# Patient Record
Sex: Female | Born: 1950
Health system: Southern US, Community
[De-identification: ages and names within clinical notes are randomized; demographics above are authoritative.]

## PROBLEM LIST (undated history)

## (undated) DIAGNOSIS — I219 Acute myocardial infarction, unspecified: Secondary | ICD-10-CM

## (undated) DIAGNOSIS — I2510 Atherosclerotic heart disease of native coronary artery without angina pectoris: Secondary | ICD-10-CM

## (undated) DIAGNOSIS — I10 Essential (primary) hypertension: Secondary | ICD-10-CM

## (undated) DIAGNOSIS — Z789 Other specified health status: Secondary | ICD-10-CM

---
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
## 1997-10-22 ENCOUNTER — Ambulatory Visit (HOSPITAL_COMMUNITY): Admission: RE | Admit: 1997-10-22 | Discharge: 1997-10-22 | Payer: Self-pay | Admitting: Family Medicine

## 1999-09-05 ENCOUNTER — Encounter: Payer: Self-pay | Admitting: Family Medicine

## 1999-09-05 ENCOUNTER — Ambulatory Visit (HOSPITAL_COMMUNITY): Admission: RE | Admit: 1999-09-05 | Discharge: 1999-09-05 | Payer: Self-pay | Admitting: Family Medicine

## 1999-11-20 ENCOUNTER — Encounter: Payer: Self-pay | Admitting: Family Medicine

## 1999-11-20 ENCOUNTER — Encounter: Admission: RE | Admit: 1999-11-20 | Discharge: 1999-11-20 | Payer: Self-pay | Admitting: Family Medicine

## 2000-11-20 ENCOUNTER — Encounter: Payer: Self-pay | Admitting: Family Medicine

## 2000-11-20 ENCOUNTER — Encounter: Admission: RE | Admit: 2000-11-20 | Discharge: 2000-11-20 | Payer: Self-pay | Admitting: Family Medicine

## 2001-12-24 ENCOUNTER — Encounter: Payer: Self-pay | Admitting: Family Medicine

## 2001-12-24 ENCOUNTER — Encounter: Admission: RE | Admit: 2001-12-24 | Discharge: 2001-12-24 | Payer: Self-pay | Admitting: Family Medicine

## 2002-12-28 ENCOUNTER — Encounter: Admission: RE | Admit: 2002-12-28 | Discharge: 2002-12-28 | Payer: Self-pay | Admitting: Family Medicine

## 2002-12-28 ENCOUNTER — Encounter: Payer: Self-pay | Admitting: Family Medicine

## 2003-06-21 ENCOUNTER — Other Ambulatory Visit: Admission: RE | Admit: 2003-06-21 | Discharge: 2003-06-21 | Payer: Self-pay | Admitting: Family Medicine

## 2004-08-23 ENCOUNTER — Other Ambulatory Visit: Admission: RE | Admit: 2004-08-23 | Discharge: 2004-08-23 | Payer: Self-pay | Admitting: Family Medicine

## 2006-02-13 ENCOUNTER — Encounter: Admission: RE | Admit: 2006-02-13 | Discharge: 2006-02-13 | Payer: Self-pay | Admitting: Family Medicine

## 2006-05-29 ENCOUNTER — Other Ambulatory Visit: Admission: RE | Admit: 2006-05-29 | Discharge: 2006-05-29 | Payer: Self-pay | Admitting: Family Medicine

## 2006-06-03 ENCOUNTER — Encounter: Admission: RE | Admit: 2006-06-03 | Discharge: 2006-06-03 | Payer: Self-pay | Admitting: Family Medicine

## 2007-02-17 ENCOUNTER — Encounter: Admission: RE | Admit: 2007-02-17 | Discharge: 2007-02-17 | Payer: Self-pay | Admitting: Family Medicine

## 2007-10-22 ENCOUNTER — Encounter: Admission: RE | Admit: 2007-10-22 | Discharge: 2007-10-22 | Payer: Self-pay | Admitting: Endocrinology

## 2008-01-28 ENCOUNTER — Other Ambulatory Visit: Admission: RE | Admit: 2008-01-28 | Discharge: 2008-01-28 | Payer: Self-pay | Admitting: Family Medicine

## 2008-03-17 ENCOUNTER — Encounter: Admission: RE | Admit: 2008-03-17 | Discharge: 2008-03-17 | Payer: Self-pay | Admitting: Family Medicine

## 2008-03-23 ENCOUNTER — Encounter: Admission: RE | Admit: 2008-03-23 | Discharge: 2008-03-23 | Payer: Self-pay | Admitting: Family Medicine

## 2009-04-07 ENCOUNTER — Encounter: Admission: RE | Admit: 2009-04-07 | Discharge: 2009-04-07 | Payer: Self-pay | Admitting: Family Medicine

## 2009-04-18 ENCOUNTER — Other Ambulatory Visit: Admission: RE | Admit: 2009-04-18 | Discharge: 2009-04-18 | Payer: Self-pay | Admitting: Family Medicine

## 2010-05-07 ENCOUNTER — Encounter: Payer: Self-pay | Admitting: Family Medicine

## 2011-11-01 ENCOUNTER — Other Ambulatory Visit: Payer: Self-pay | Admitting: Family Medicine

## 2011-11-01 DIAGNOSIS — Z1231 Encounter for screening mammogram for malignant neoplasm of breast: Secondary | ICD-10-CM

## 2011-11-07 ENCOUNTER — Ambulatory Visit
Admission: RE | Admit: 2011-11-07 | Discharge: 2011-11-07 | Disposition: A | Discharge status: Home / Self Care | Source: Ambulatory Visit | Attending: Family Medicine | Admitting: Family Medicine

## 2011-11-07 DIAGNOSIS — Z1231 Encounter for screening mammogram for malignant neoplasm of breast: Secondary | ICD-10-CM

## 2012-02-28 ENCOUNTER — Encounter (HOSPITAL_COMMUNITY): Payer: Self-pay | Admitting: Emergency Medicine

## 2012-02-28 ENCOUNTER — Emergency Department (HOSPITAL_COMMUNITY)
Admission: EM | Admit: 2012-02-28 | Discharge: 2012-02-28 | Disposition: A | Discharge status: Home / Self Care | Payer: BC Managed Care – PPO | Attending: Emergency Medicine | Admitting: Emergency Medicine

## 2012-02-28 DIAGNOSIS — Z0000 Encounter for general adult medical examination without abnormal findings: Secondary | ICD-10-CM

## 2012-02-28 DIAGNOSIS — T7840XA Allergy, unspecified, initial encounter: Secondary | ICD-10-CM

## 2012-02-28 DIAGNOSIS — R220 Localized swelling, mass and lump, head: Secondary | ICD-10-CM | POA: Insufficient documentation

## 2012-02-28 DIAGNOSIS — Z7982 Long term (current) use of aspirin: Secondary | ICD-10-CM | POA: Insufficient documentation

## 2012-02-28 DIAGNOSIS — J029 Acute pharyngitis, unspecified: Secondary | ICD-10-CM | POA: Insufficient documentation

## 2012-02-28 DIAGNOSIS — Z79899 Other long term (current) drug therapy: Secondary | ICD-10-CM | POA: Insufficient documentation

## 2012-02-28 DIAGNOSIS — T4995XA Adverse effect of unspecified topical agent, initial encounter: Secondary | ICD-10-CM | POA: Insufficient documentation

## 2012-02-28 LAB — POCT I-STAT, CHEM 8
BUN: 23 mg/dL (ref 6–23)
Chloride: 106 mEq/L (ref 96–112)
Creatinine, Ser: 1.1 mg/dL (ref 0.50–1.10)
Hemoglobin: 13.3 g/dL (ref 12.0–15.0)

## 2012-02-28 LAB — CBC
MCHC: 33.6 g/dL (ref 30.0–36.0)
MCV: 92.4 fL (ref 78.0–100.0)
RBC: 4.19 MIL/uL (ref 3.87–5.11)
WBC: 4.9 10*3/uL (ref 4.0–10.5)

## 2012-02-28 NOTE — ED Notes (Signed)
Pt. Reports "I think I had a mini-stroke last week". Reports woke up with right eye and bottom lip swollen. Reports sore throat and tongue hurting. Alert and oriented x4.

## 2012-02-28 NOTE — ED Notes (Signed)
Pt. Alert and oriented x4. No signs of distress.

## 2012-02-28 NOTE — ED Notes (Addendum)
Pt reports that her tongue is hurting, neck and throat hurting, and R lip was swollen one day and then next day L lip was swollen- swelling has gone away; pt also reports that R eye is swollen; on assessment pt has no swelling to lips or eye

## 2012-02-28 NOTE — ED Provider Notes (Signed)
History     CSN: [id number]  Arrival date & time 02/28/12  1542   First MD Initiated Contact with Patient 02/28/12 1715      Chief Complaint  Patient presents with  . Multiple Complaints     (Consider location/radiation/quality/duration/timing/severity/associated sxs/prior treatment) The history is provided by the patient. No language interpreter was used.  cc:  61 yo female Patient c/o possible allergic reaction to ?  States that last Tuesday (9 days ago) she woke with swollen lips on the R and tender tongue that resolved that day.  On Thursday she had swollen lips on the L and she prayed and it got better.  Also her R eye was slightly swollen but has resolved.  States that her throat was scratchy 2 days ago and she ate some salty olives and her throat was better.  Today her tongue is tender and her R eye seems swollen as well.  Denies SOB, chest pain,  Vision problems, swallowing problems, memory problems, cough.  pmh of hyperlipidemia and GERD.  Goes to [facility] for pcp.  No surgeries in the past.    History reviewed. No pertinent past medical history.  History reviewed. No pertinent past surgical history.  History reviewed. No pertinent family history.  History  Substance Use Topics  . Smoking status: Never Smoker   . Smokeless tobacco: Not on file  . Alcohol Use: No    OB History    Grav Para Term Preterm Abortions TAB SAB Ect Mult Living                  Review of Systems  Constitutional: Negative.   HENT: Positive for sore throat and facial swelling. Negative for hearing loss, ear pain, congestion, rhinorrhea, sneezing, drooling, mouth sores, trouble swallowing, neck pain, neck stiffness, dental problem, sinus pressure and tinnitus.   Eyes: Negative.   Respiratory: Negative.   Cardiovascular: Negative.   Gastrointestinal: Negative.   Musculoskeletal: Negative for back pain.  Neurological: Negative.   Psychiatric/Behavioral: Negative.   All other systems  reviewed and are negative.    Allergies  Review of patient's allergies indicates no known allergies.  Home Medications   Current Outpatient Rx  Name  Route  Sig  Dispense  Refill  . ASPIRIN 81 MG PO CHEW   Oral   Chew 81 mg by mouth daily.         Marland Kitchen CALCIUM PO   Oral   Take 1 tablet by mouth daily.         Marland Kitchen VITAMIN D PO   Oral   Take 1 tablet by mouth daily.         Marland Kitchen ESOMEPRAZOLE MAGNESIUM 40 MG PO CPDR   Oral   Take 40 mg by mouth daily as needed. For heartburn         . IBANDRONATE SODIUM 150 MG PO TABS   Oral   Take 150 mg by mouth every 30 (thirty) days. Take in the morning with a full glass of water, on an empty stomach, and do not take anything else by mouth or lie down for the next 30 min. Takes the first week of the month.         . ADULT MULTIVITAMIN W/MINERALS CH   Oral   Take 1 tablet by mouth daily.         . OMEGA-3-ACID ETHYL ESTERS 1 G PO CAPS   Oral   Take 1 g by mouth daily.         Marland Kitchen  ROSUVASTATIN CALCIUM 10 MG PO TABS   Oral   Take 10 mg by mouth daily.           BP 149/79  Pulse 85  Temp 98.5 F (36.9 C) (Oral)  Resp 16  SpO2 100%  Physical Exam  Nursing note and vitals reviewed. Constitutional: She is oriented to person, place, and time. She appears well-developed and well-nourished.  HENT:  Head: Normocephalic and atraumatic.  Right Ear: Tympanic membrane normal.  Left Ear: Tympanic membrane normal.  Nose: Nose normal. No mucosal edema, rhinorrhea or sinus tenderness.  Mouth/Throat: Uvula is midline, oropharynx is clear and moist and mucous membranes are normal. She does not have dentures. Normal dentition. Uvula swelling present. No dental abscesses, lacerations or dental caries. No oropharyngeal exudate, posterior oropharyngeal edema, posterior oropharyngeal erythema or tonsillar abscesses.  Eyes: Conjunctivae normal and EOM are normal. Pupils are equal, round, and reactive to light.  Neck: Normal range of motion.  Neck supple.  Cardiovascular: Normal rate.   Pulmonary/Chest: Effort normal and breath sounds normal. No respiratory distress. She has no wheezes.  Abdominal: Soft.  Musculoskeletal: Normal range of motion. She exhibits no edema and no tenderness.  Neurological: She is alert and oriented to person, place, and time. She has normal reflexes.  Skin: Skin is warm and dry.  Psychiatric: She has a normal mood and affect.    ED Course  Procedures (including critical care time)   Labs Reviewed  CBC   No results found.   No diagnosis found.    MDM  Symptoms in the past week with swollen lips and tender tongue resolved tonight. ? Allergic reaction.  Labs unremarkable.  Understands to return for worsening symptoms.  Have benadryl on hand.  She is not on an ACE inhibitor. She will follow up with pcp tomorrow.   Labs Reviewed  POCT I-STAT, CHEM 8 - Abnormal; Notable for the following:    Glucose, Bld 100 (*)     All other components within normal limits  CBC          Remi Haggard, NP 02/29/12 0040

## 2012-03-03 NOTE — ED Provider Notes (Signed)
Medical screening examination/treatment/procedure(s) were performed by non-physician practitioner and as supervising physician I was immediately available for consultation/collaboration.   Celene Kras, MD 03/03/12 [phone]

## 2012-03-17 ENCOUNTER — Other Ambulatory Visit (HOSPITAL_COMMUNITY)
Admission: RE | Admit: 2012-03-17 | Discharge: 2012-03-17 | Disposition: A | Discharge status: Home / Self Care | Payer: BC Managed Care – PPO | Source: Ambulatory Visit | Attending: Family Medicine | Admitting: Family Medicine

## 2012-03-17 ENCOUNTER — Other Ambulatory Visit: Payer: Self-pay | Admitting: Family Medicine

## 2012-03-17 DIAGNOSIS — Z01419 Encounter for gynecological examination (general) (routine) without abnormal findings: Secondary | ICD-10-CM | POA: Insufficient documentation

## 2012-03-27 ENCOUNTER — Other Ambulatory Visit: Payer: Self-pay | Admitting: Family Medicine

## 2012-03-27 DIAGNOSIS — M810 Age-related osteoporosis without current pathological fracture: Secondary | ICD-10-CM

## 2012-04-04 ENCOUNTER — Other Ambulatory Visit

## 2012-04-10 ENCOUNTER — Ambulatory Visit
Admission: RE | Admit: 2012-04-10 | Discharge: 2012-04-10 | Disposition: A | Discharge status: Home / Self Care | Source: Ambulatory Visit | Attending: Family Medicine | Admitting: Family Medicine

## 2012-04-10 DIAGNOSIS — M810 Age-related osteoporosis without current pathological fracture: Secondary | ICD-10-CM

## 2013-03-23 ENCOUNTER — Other Ambulatory Visit: Payer: Self-pay | Admitting: Family Medicine

## 2013-03-23 DIAGNOSIS — R519 Headache, unspecified: Secondary | ICD-10-CM

## 2013-03-25 ENCOUNTER — Ambulatory Visit
Admission: RE | Admit: 2013-03-25 | Discharge: 2013-03-25 | Disposition: A | Discharge status: Home / Self Care | Payer: BC Managed Care – PPO | Source: Ambulatory Visit | Attending: Family Medicine | Admitting: Family Medicine

## 2013-03-25 DIAGNOSIS — R519 Headache, unspecified: Secondary | ICD-10-CM

## 2014-07-07 ENCOUNTER — Other Ambulatory Visit: Payer: Self-pay | Admitting: Family Medicine

## 2014-07-07 ENCOUNTER — Other Ambulatory Visit (HOSPITAL_COMMUNITY)
Admission: RE | Admit: 2014-07-07 | Discharge: 2014-07-07 | Disposition: A | Discharge status: Home / Self Care | Payer: BC Managed Care – PPO | Source: Ambulatory Visit | Attending: Family Medicine | Admitting: Family Medicine

## 2014-07-07 DIAGNOSIS — Z01419 Encounter for gynecological examination (general) (routine) without abnormal findings: Secondary | ICD-10-CM | POA: Diagnosis present

## 2014-07-07 DIAGNOSIS — Z1151 Encounter for screening for human papillomavirus (HPV): Secondary | ICD-10-CM | POA: Diagnosis present

## 2014-07-08 LAB — CYTOLOGY - PAP

## 2014-08-09 ENCOUNTER — Other Ambulatory Visit: Payer: Self-pay | Admitting: Family Medicine

## 2014-08-09 DIAGNOSIS — M810 Age-related osteoporosis without current pathological fracture: Secondary | ICD-10-CM

## 2014-08-16 ENCOUNTER — Ambulatory Visit
Admission: RE | Admit: 2014-08-16 | Discharge: 2014-08-16 | Disposition: A | Discharge status: Home / Self Care | Payer: BC Managed Care – PPO | Source: Ambulatory Visit | Attending: Family Medicine | Admitting: Family Medicine

## 2014-08-16 DIAGNOSIS — M810 Age-related osteoporosis without current pathological fracture: Secondary | ICD-10-CM

## 2014-11-30 ENCOUNTER — Encounter (HOSPITAL_COMMUNITY): Payer: Self-pay | Admitting: Emergency Medicine

## 2014-11-30 ENCOUNTER — Emergency Department (HOSPITAL_COMMUNITY)
Admission: EM | Admit: 2014-11-30 | Discharge: 2014-11-30 | Disposition: A | Discharge status: Home / Self Care | Payer: BC Managed Care – PPO | Attending: Emergency Medicine | Admitting: Emergency Medicine

## 2014-11-30 DIAGNOSIS — Z7982 Long term (current) use of aspirin: Secondary | ICD-10-CM | POA: Diagnosis not present

## 2014-11-30 DIAGNOSIS — T7840XA Allergy, unspecified, initial encounter: Secondary | ICD-10-CM

## 2014-11-30 DIAGNOSIS — Y9389 Activity, other specified: Secondary | ICD-10-CM | POA: Diagnosis not present

## 2014-11-30 DIAGNOSIS — X58XXXA Exposure to other specified factors, initial encounter: Secondary | ICD-10-CM | POA: Diagnosis not present

## 2014-11-30 DIAGNOSIS — K1379 Other lesions of oral mucosa: Secondary | ICD-10-CM

## 2014-11-30 DIAGNOSIS — Y9289 Other specified places as the place of occurrence of the external cause: Secondary | ICD-10-CM | POA: Insufficient documentation

## 2014-11-30 DIAGNOSIS — J384 Edema of larynx: Secondary | ICD-10-CM | POA: Diagnosis not present

## 2014-11-30 DIAGNOSIS — Z79899 Other long term (current) drug therapy: Secondary | ICD-10-CM | POA: Diagnosis not present

## 2014-11-30 DIAGNOSIS — Y998 Other external cause status: Secondary | ICD-10-CM | POA: Insufficient documentation

## 2014-11-30 DIAGNOSIS — R490 Dysphonia: Secondary | ICD-10-CM | POA: Diagnosis present

## 2014-11-30 MED ORDER — EPINEPHRINE 0.3 MG/0.3ML IJ SOAJ: 0.3000 mg | Freq: Once | INTRAMUSCULAR | Status: DC

## 2014-11-30 MED ORDER — DEXAMETHASONE 4 MG PO TABS
10.0000 mg | ORAL_TABLET | Freq: Once | ORAL | Status: AC
  Administered 2014-11-30: 10 mg via ORAL
  Filled 2014-11-30: qty 2
  Filled 2014-11-30: qty 1

## 2014-11-30 MED ORDER — FAMOTIDINE 40 MG PO TABS: 40.0000 mg | ORAL_TABLET | Freq: Two times a day (BID) | ORAL | Status: DC

## 2014-11-30 MED ORDER — DIPHENHYDRAMINE HCL 50 MG PO CAPS: 50.0000 mg | ORAL_CAPSULE | Freq: Four times a day (QID) | ORAL | Status: DC

## 2014-11-30 MED ORDER — FAMOTIDINE 20 MG PO TABS
40.0000 mg | ORAL_TABLET | Freq: Once | ORAL | Status: AC
  Administered 2014-11-30: 40 mg via ORAL
  Filled 2014-11-30: qty 2

## 2014-11-30 MED ORDER — DIPHENHYDRAMINE HCL 25 MG PO CAPS
50.0000 mg | ORAL_CAPSULE | Freq: Once | ORAL | Status: AC
  Administered 2014-11-30: 50 mg via ORAL
  Filled 2014-11-30: qty 2

## 2014-11-30 NOTE — ED Notes (Signed)
Pt is asking to leave. She reports feeling "slightly better", but states "i just have to get home"

## 2014-11-30 NOTE — Discharge Instructions (Signed)

## 2014-11-30 NOTE — ED Provider Notes (Signed)
CSN: [id number]     Arrival date & time 11/30/14  1932 History   First MD Initiated Contact with Patient 11/30/14 2011     Chief Complaint  Patient presents with  . Hoarse     (Consider location/radiation/quality/duration/timing/severity/associated sxs/prior Treatment) Patient is a 64 y.o. female presenting with allergic reaction. The history is provided by the patient.  Allergic Reaction Presenting symptoms: difficulty swallowing and swelling (of throat and lips)   Difficulty swallowing:    Severity:  Mild   Onset quality:  Gradual   Timing:  Intermittent Context: food (occuring after grapes while in training at her job)   Relieved by:  Nothing Worsened by:  Nothing tried Ineffective treatments:  None tried   History reviewed. No pertinent past medical history. History reviewed. No pertinent past surgical history. History reviewed. No pertinent family history. Social History  Substance Use Topics  . Smoking status: Never Smoker   . Smokeless tobacco: None  . Alcohol Use: No   OB History    No data available     Review of Systems  HENT: Positive for trouble swallowing.   All other systems reviewed and are negative.     Allergies  Review of patient's allergies indicates no known allergies.  Home Medications   Prior to Admission medications   Medication Sig Start Date End Date Taking? Authorizing Provider  aspirin 81 MG chewable tablet Chew 81 mg by mouth daily.   Yes Historical Provider, MD  CALCIUM PO Take 1 tablet by mouth daily.   Yes Historical Provider, MD  Cholecalciferol (VITAMIN D PO) Take 1 tablet by mouth daily.   Yes Historical Provider, MD  esomeprazole (NEXIUM) 40 MG capsule Take 40 mg by mouth daily as needed. For heartburn   Yes Historical Provider, MD  ibandronate (BONIVA) 150 MG tablet Take 150 mg by mouth every 30 (thirty) days. Take in the morning with a full glass of water, on an empty stomach, and do not take anything else by mouth or lie down  for the next 30 min. Takes the first week of the month.   Yes Historical Provider, MD  Multiple Vitamin (MULTIVITAMIN WITH MINERALS) TABS Take 1 tablet by mouth daily.   Yes Historical Provider, MD  omega-3 acid ethyl esters (LOVAZA) 1 G capsule Take 1 g by mouth daily.   Yes Historical Provider, MD  simvastatin (ZOCOR) 20 MG tablet Take 20 mg by mouth daily at 6 PM.   Yes Historical Provider, MD  diphenhydrAMINE (BENADRYL) 50 MG capsule Take 1 capsule (50 mg total) by mouth every 6 (six) hours. 11/30/14   Lyndal Pulley, MD  EPINEPHrine 0.3 mg/0.3 mL IJ SOAJ injection Inject 0.3 mLs (0.3 mg total) into the muscle once. 11/30/14   Lyndal Pulley, MD  famotidine (PEPCID) 40 MG tablet Take 1 tablet (40 mg total) by mouth 2 (two) times daily. 11/30/14   Lyndal Pulley, MD   BP 138/61 mmHg  Pulse 68  Temp(Src) 97.5 F (36.4 C) (Oral)  Resp 18  SpO2 100% Physical Exam  Constitutional: She is oriented to person, place, and time. She appears well-developed and well-nourished. No distress.  HENT:  Head: Normocephalic.  Mouth/Throat: Mucous membranes are normal. Uvula swelling (mild asymmetrical without PTA or shift) present. No oropharyngeal exudate or tonsillar abscesses.  Eyes: Conjunctivae are normal.  Neck: Trachea normal and full passive range of motion without pain. Neck supple. No tracheal deviation present.  Cardiovascular: Normal rate and regular rhythm.   Pulmonary/Chest: Effort normal. No  stridor. No respiratory distress.  Abdominal: Soft. She exhibits no distension.  Neurological: She is alert and oriented to person, place, and time.  Skin: Skin is warm and dry.  Psychiatric: She has a normal mood and affect.    ED Course  Procedures (including critical care time) Labs Review Labs Reviewed - No data to display  Imaging Review No results found. I have personally reviewed and evaluated these images and lab results as part of my medical decision-making.   EKG Interpretation None       MDM   Final diagnoses:  Allergic reaction, initial encounter  Hoarse voice quality  Uvular edema   64 y.o. female presents with hoarseness of voice since today at her job training when she was up to speak to the group she was unable to. She states she went to a walk-in clinic and was sent here with concern for mass and paper from clinic shows there was concern for impending airway. Airway is clear and Pt with mild uvular swelling but no evidence of mass effect or shift. She states she has had these episodes intermittently and has not been definitively diagnosed. Throughout interview she is mildly uncooperative and insistent that she only eats organic foods and exercises frequently. She admits to having some swelling following eating grapes and also notes her symptoms started todday after eating grapes. Provided H1/H2 blockers and decadron for swelling, improved during ED course and hoarseness improved. No indication for imaging currently. No other evidence of impending anaphylaxis but provided with prescription for epi pen to carry with her in case of impending worsening airway. Recommended scheduling antihistamines for the next few days and monitoring her symptoms closely. Also recommended allergy testing and outpatient workup for autoimmune/rheumatologic causes if unenlightening. Plan to follow up with PCP as needed and return precautions discussed for worsening or new concerning symptoms.    Lyndal Pulley, MD 12/01/14 [phone]

## 2014-11-30 NOTE — ED Notes (Signed)
Pt is coming from walk-in clinic [facility] physicians) reporting left pharyngeal mass with uvula shift to the right. Denies SOB/difficulty swallowing. Patient's voice is hoarse and says "some drool came out today without me realizing it." Speaking full/clear sentences. No drooling noted. Also says she notices some lip and eye swelling at intermittent times. No swelling noted at this time.

## 2015-08-09 ENCOUNTER — Other Ambulatory Visit (HOSPITAL_COMMUNITY)
Admission: RE | Admit: 2015-08-09 | Discharge: 2015-08-09 | Disposition: A | Discharge status: Home / Self Care | Payer: BC Managed Care – PPO | Source: Ambulatory Visit | Attending: Family Medicine | Admitting: Family Medicine

## 2015-08-09 ENCOUNTER — Other Ambulatory Visit: Payer: Self-pay | Admitting: Family Medicine

## 2015-08-09 DIAGNOSIS — Z01419 Encounter for gynecological examination (general) (routine) without abnormal findings: Secondary | ICD-10-CM | POA: Diagnosis present

## 2015-08-11 LAB — CYTOLOGY - PAP

## 2015-10-05 ENCOUNTER — Other Ambulatory Visit: Payer: Self-pay | Admitting: Family Medicine

## 2015-10-05 DIAGNOSIS — Z1231 Encounter for screening mammogram for malignant neoplasm of breast: Secondary | ICD-10-CM

## 2015-10-24 ENCOUNTER — Ambulatory Visit
Admission: RE | Admit: 2015-10-24 | Discharge: 2015-10-24 | Disposition: A | Discharge status: Home / Self Care | Payer: BC Managed Care – PPO | Source: Ambulatory Visit | Attending: Family Medicine | Admitting: Family Medicine

## 2015-10-24 DIAGNOSIS — Z1231 Encounter for screening mammogram for malignant neoplasm of breast: Secondary | ICD-10-CM

## 2016-08-13 ENCOUNTER — Other Ambulatory Visit (HOSPITAL_COMMUNITY)
Admission: RE | Admit: 2016-08-13 | Discharge: 2016-08-13 | Disposition: A | Discharge status: Home / Self Care | Payer: BC Managed Care – PPO | Source: Ambulatory Visit | Attending: Family Medicine | Admitting: Family Medicine

## 2016-08-13 ENCOUNTER — Other Ambulatory Visit: Payer: Self-pay | Admitting: Family Medicine

## 2016-08-13 DIAGNOSIS — Z01419 Encounter for gynecological examination (general) (routine) without abnormal findings: Secondary | ICD-10-CM | POA: Insufficient documentation

## 2016-08-14 LAB — CYTOLOGY - PAP: DIAGNOSIS: NEGATIVE

## 2016-09-26 ENCOUNTER — Other Ambulatory Visit: Payer: Self-pay | Admitting: Family Medicine

## 2016-09-26 DIAGNOSIS — Z1231 Encounter for screening mammogram for malignant neoplasm of breast: Secondary | ICD-10-CM

## 2016-10-24 ENCOUNTER — Ambulatory Visit
Admission: RE | Admit: 2016-10-24 | Discharge: 2016-10-24 | Disposition: A | Discharge status: Home / Self Care | Payer: BC Managed Care – PPO | Source: Ambulatory Visit | Attending: Family Medicine | Admitting: Family Medicine

## 2016-10-24 ENCOUNTER — Encounter: Payer: Self-pay | Admitting: Radiology

## 2016-10-24 DIAGNOSIS — Z1231 Encounter for screening mammogram for malignant neoplasm of breast: Secondary | ICD-10-CM

## 2016-11-08 ENCOUNTER — Ambulatory Visit: Payer: BC Managed Care – PPO

## 2017-04-15 DIAGNOSIS — H2513 Age-related nuclear cataract, bilateral: Secondary | ICD-10-CM | POA: Diagnosis not present

## 2017-04-15 DIAGNOSIS — H25013 Cortical age-related cataract, bilateral: Secondary | ICD-10-CM | POA: Diagnosis not present

## 2017-04-15 DIAGNOSIS — H40013 Open angle with borderline findings, low risk, bilateral: Secondary | ICD-10-CM | POA: Diagnosis not present

## 2017-04-15 DIAGNOSIS — H04123 Dry eye syndrome of bilateral lacrimal glands: Secondary | ICD-10-CM | POA: Diagnosis not present

## 2017-09-23 ENCOUNTER — Other Ambulatory Visit: Payer: Self-pay | Admitting: Family Medicine

## 2017-09-23 DIAGNOSIS — Z1231 Encounter for screening mammogram for malignant neoplasm of breast: Secondary | ICD-10-CM

## 2017-10-25 ENCOUNTER — Ambulatory Visit
Admission: RE | Admit: 2017-10-25 | Discharge: 2017-10-25 | Disposition: A | Discharge status: Home / Self Care | Payer: Medicare Other | Source: Ambulatory Visit | Attending: Family Medicine | Admitting: Family Medicine

## 2017-10-25 DIAGNOSIS — Z1231 Encounter for screening mammogram for malignant neoplasm of breast: Secondary | ICD-10-CM

## 2017-12-02 ENCOUNTER — Other Ambulatory Visit: Payer: Self-pay | Admitting: Family Medicine

## 2017-12-02 ENCOUNTER — Other Ambulatory Visit (HOSPITAL_COMMUNITY)
Admission: RE | Admit: 2017-12-02 | Discharge: 2017-12-02 | Disposition: A | Discharge status: Home / Self Care | Payer: Medicare Other | Source: Ambulatory Visit | Attending: Family Medicine | Admitting: Family Medicine

## 2017-12-02 DIAGNOSIS — Z23 Encounter for immunization: Secondary | ICD-10-CM | POA: Diagnosis not present

## 2017-12-02 DIAGNOSIS — Z1389 Encounter for screening for other disorder: Secondary | ICD-10-CM | POA: Diagnosis not present

## 2017-12-02 DIAGNOSIS — E7800 Pure hypercholesterolemia, unspecified: Secondary | ICD-10-CM | POA: Diagnosis not present

## 2017-12-02 DIAGNOSIS — Z0000 Encounter for general adult medical examination without abnormal findings: Secondary | ICD-10-CM | POA: Insufficient documentation

## 2017-12-02 DIAGNOSIS — K219 Gastro-esophageal reflux disease without esophagitis: Secondary | ICD-10-CM | POA: Diagnosis not present

## 2017-12-02 DIAGNOSIS — M810 Age-related osteoporosis without current pathological fracture: Secondary | ICD-10-CM | POA: Diagnosis not present

## 2017-12-03 ENCOUNTER — Other Ambulatory Visit: Payer: Self-pay | Admitting: Family Medicine

## 2017-12-03 DIAGNOSIS — M810 Age-related osteoporosis without current pathological fracture: Secondary | ICD-10-CM

## 2017-12-04 LAB — CYTOLOGY - PAP
Diagnosis: NEGATIVE
HPV: NOT DETECTED

## 2017-12-08 ENCOUNTER — Inpatient Hospital Stay (HOSPITAL_COMMUNITY)
Admission: EM | Admit: 2017-12-08 | Discharge: 2017-12-10 | DRG: 250 | Disposition: A | Discharge status: Home / Self Care | Payer: BC Managed Care – PPO | Attending: Cardiology | Admitting: Cardiology

## 2017-12-08 ENCOUNTER — Encounter (HOSPITAL_COMMUNITY)
Admission: EM | Disposition: A | Discharge status: Home / Self Care | Payer: Self-pay | Source: Home / Self Care | Attending: Cardiology

## 2017-12-08 ENCOUNTER — Encounter (HOSPITAL_COMMUNITY): Payer: Self-pay | Admitting: Emergency Medicine

## 2017-12-08 ENCOUNTER — Other Ambulatory Visit: Payer: Self-pay

## 2017-12-08 ENCOUNTER — Emergency Department (HOSPITAL_COMMUNITY): Payer: BC Managed Care – PPO

## 2017-12-08 DIAGNOSIS — I272 Pulmonary hypertension, unspecified: Secondary | ICD-10-CM | POA: Diagnosis present

## 2017-12-08 DIAGNOSIS — I2102 ST elevation (STEMI) myocardial infarction involving left anterior descending coronary artery: Secondary | ICD-10-CM

## 2017-12-08 DIAGNOSIS — Z9861 Coronary angioplasty status: Secondary | ICD-10-CM

## 2017-12-08 DIAGNOSIS — Z888 Allergy status to other drugs, medicaments and biological substances status: Secondary | ICD-10-CM

## 2017-12-08 DIAGNOSIS — E785 Hyperlipidemia, unspecified: Secondary | ICD-10-CM | POA: Diagnosis present

## 2017-12-08 DIAGNOSIS — I5041 Acute combined systolic (congestive) and diastolic (congestive) heart failure: Secondary | ICD-10-CM | POA: Diagnosis present

## 2017-12-08 DIAGNOSIS — I213 ST elevation (STEMI) myocardial infarction of unspecified site: Secondary | ICD-10-CM

## 2017-12-08 DIAGNOSIS — I4891 Unspecified atrial fibrillation: Secondary | ICD-10-CM | POA: Diagnosis not present

## 2017-12-08 HISTORY — DX: Atherosclerotic heart disease of native coronary artery without angina pectoris: I25.10

## 2017-12-08 HISTORY — PX: LEFT HEART CATH AND CORONARY ANGIOGRAPHY: CATH118249

## 2017-12-08 HISTORY — PX: CORONARY/GRAFT ACUTE MI REVASCULARIZATION: CATH118305

## 2017-12-08 HISTORY — DX: Other specified health status: Z78.9

## 2017-12-08 HISTORY — DX: Essential (primary) hypertension: I10

## 2017-12-08 HISTORY — DX: Acute myocardial infarction, unspecified: I21.9

## 2017-12-08 HISTORY — DX: ST elevation (STEMI) myocardial infarction involving left anterior descending coronary artery: I21.02

## 2017-12-08 LAB — CBC WITH DIFFERENTIAL/PLATELET
Basophils Absolute: 0 10*3/uL (ref 0.0–0.1)
Basophils Relative: 0 %
Eosinophils Absolute: 0.1 10*3/uL (ref 0.0–0.7)
Eosinophils Relative: 1 %
HCT: 37.8 % (ref 36.0–46.0)
Hemoglobin: 12.3 g/dL (ref 12.0–15.0)
Lymphocytes Relative: 66 %
Lymphs Abs: 3.5 10*3/uL (ref 0.7–4.0)
MCH: 30.5 pg (ref 26.0–34.0)
MCHC: 32.5 g/dL (ref 30.0–36.0)
MCV: 93.8 fL (ref 78.0–100.0)
Monocytes Absolute: 0.5 10*3/uL (ref 0.1–1.0)
Monocytes Relative: 10 %
Neutro Abs: 1.2 10*3/uL — ABNORMAL LOW (ref 1.7–7.7)
Neutrophils Relative %: 23 %
Platelets: 206 10*3/uL (ref 150–400)
RBC: 4.03 MIL/uL (ref 3.87–5.11)
RDW: 14 % (ref 11.5–15.5)
WBC: 5.3 10*3/uL (ref 4.0–10.5)

## 2017-12-08 LAB — COMPREHENSIVE METABOLIC PANEL
ALT: 23 U/L (ref 0–44)
AST: 32 U/L (ref 15–41)
Albumin: 3.7 g/dL (ref 3.5–5.0)
Alkaline Phosphatase: 85 U/L (ref 38–126)
Anion gap: 11 (ref 5–15)
BUN: 25 mg/dL — ABNORMAL HIGH (ref 8–23)
CO2: 25 mmol/L (ref 22–32)
Calcium: 9 mg/dL (ref 8.9–10.3)
Chloride: 105 mmol/L (ref 98–111)
Creatinine, Ser: 1.13 mg/dL — ABNORMAL HIGH (ref 0.44–1.00)
GFR calc Af Amer: 58 mL/min — ABNORMAL LOW (ref >60)
GFR calc non Af Amer: 50 mL/min — ABNORMAL LOW (ref >60)
Glucose, Bld: 127 mg/dL — ABNORMAL HIGH (ref 70–99)
Potassium: 3.6 mmol/L (ref 3.5–5.1)
Sodium: 141 mmol/L (ref 135–145)
Total Bilirubin: 0.6 mg/dL (ref 0.3–1.2)
Total Protein: 6.9 g/dL (ref 6.5–8.1)

## 2017-12-08 LAB — I-STAT TROPONIN, ED: Troponin i, poc: 0.01 ng/mL (ref 0.00–0.08)

## 2017-12-08 LAB — TROPONIN I: Troponin I: <0.03 ng/mL (ref <0.03)

## 2017-12-08 LAB — LIPID PANEL
Cholesterol: 175 mg/dL (ref 0–200)
HDL: 59 mg/dL (ref >40)
LDL Cholesterol: 103 mg/dL — ABNORMAL HIGH (ref 0–99)
Total CHOL/HDL Ratio: 3 RATIO
Triglycerides: 64 mg/dL (ref <150)
VLDL: 13 mg/dL (ref 0–40)

## 2017-12-08 LAB — APTT: aPTT: 25 seconds (ref 24–36)

## 2017-12-08 LAB — PROTIME-INR
INR: 0.93
Prothrombin Time: 12.4 seconds (ref 11.4–15.2)

## 2017-12-08 SURGERY — CORONARY/GRAFT ACUTE MI REVASCULARIZATION
Anesthesia: LOCAL

## 2017-12-08 MED ORDER — HEPARIN SODIUM (PORCINE) 1000 UNIT/ML IJ SOLN
INTRAMUSCULAR | Status: AC
  Filled 2017-12-08: qty 1

## 2017-12-08 MED ORDER — ASPIRIN EC 81 MG PO TBEC
81.0000 mg | DELAYED_RELEASE_TABLET | Freq: Every day | ORAL | Status: DC
  Administered 2017-12-09 – 2017-12-10 (×2): 81 mg via ORAL
  Filled 2017-12-08 (×2): qty 1

## 2017-12-08 MED ORDER — ASPIRIN 300 MG RE SUPP: 300.0000 mg | RECTAL | Status: AC

## 2017-12-08 MED ORDER — ASPIRIN 81 MG PO CHEW
324.0000 mg | CHEWABLE_TABLET | ORAL | Status: AC
  Filled 2017-12-08: qty 4

## 2017-12-08 MED ORDER — LIDOCAINE HCL (PF) 1 % IJ SOLN
INTRAMUSCULAR | Status: AC
  Filled 2017-12-08: qty 30

## 2017-12-08 MED ORDER — TICAGRELOR 90 MG PO TABS
ORAL_TABLET | ORAL | Status: AC
  Filled 2017-12-08: qty 2

## 2017-12-08 MED ORDER — MIDAZOLAM HCL 2 MG/2ML IJ SOLN
INTRAMUSCULAR | Status: DC | PRN
  Administered 2017-12-08: 1 mg via INTRAVENOUS

## 2017-12-08 MED ORDER — ALPRAZOLAM 0.25 MG PO TABS: 0.2500 mg | ORAL_TABLET | Freq: Two times a day (BID) | ORAL | Status: DC | PRN

## 2017-12-08 MED ORDER — MIDAZOLAM HCL 2 MG/2ML IJ SOLN
INTRAMUSCULAR | Status: AC
  Filled 2017-12-08: qty 2

## 2017-12-08 MED ORDER — ONDANSETRON HCL 4 MG/2ML IJ SOLN: 4.0000 mg | Freq: Four times a day (QID) | INTRAMUSCULAR | Status: DC | PRN

## 2017-12-08 MED ORDER — HEPARIN SODIUM (PORCINE) 1000 UNIT/ML IJ SOLN
INTRAMUSCULAR | Status: DC | PRN
  Administered 2017-12-08: 4000 [IU] via INTRAVENOUS

## 2017-12-08 MED ORDER — SODIUM CHLORIDE 0.9 % IV SOLN
INTRAVENOUS | Status: DC
  Administered 2017-12-08 – 2017-12-09 (×2): via INTRAVENOUS

## 2017-12-08 MED ORDER — NITROGLYCERIN IN D5W 200-5 MCG/ML-% IV SOLN
10.0000 ug/min | Freq: Once | INTRAVENOUS | Status: AC
  Administered 2017-12-08: 10 ug/min via INTRAVENOUS
  Filled 2017-12-08: qty 250

## 2017-12-08 MED ORDER — SIMVASTATIN 40 MG PO TABS: 40.0000 mg | ORAL_TABLET | Freq: Every day | ORAL | Status: DC

## 2017-12-08 MED ORDER — METOPROLOL TARTRATE 12.5 MG HALF TABLET
12.5000 mg | ORAL_TABLET | Freq: Two times a day (BID) | ORAL | Status: DC
  Administered 2017-12-09 – 2017-12-10 (×4): 12.5 mg via ORAL
  Filled 2017-12-08 (×4): qty 1

## 2017-12-08 MED ORDER — ASPIRIN 81 MG PO CHEW
324.0000 mg | CHEWABLE_TABLET | Freq: Once | ORAL | Status: AC
  Administered 2017-12-08: 324 mg via ORAL
  Filled 2017-12-08: qty 4

## 2017-12-08 MED ORDER — NITROGLYCERIN 1 MG/10 ML FOR IR/CATH LAB
INTRA_ARTERIAL | Status: DC | PRN
  Administered 2017-12-08 (×2): 200 ug via INTRACORONARY

## 2017-12-08 MED ORDER — HEPARIN SODIUM (PORCINE) 5000 UNIT/ML IJ SOLN
4000.0000 [IU] | Freq: Once | INTRAMUSCULAR | Status: AC
  Administered 2017-12-08: 4000 [IU] via INTRAVENOUS
  Filled 2017-12-08: qty 0.8

## 2017-12-08 MED ORDER — NITROGLYCERIN 1 MG/10 ML FOR IR/CATH LAB
INTRA_ARTERIAL | Status: AC
  Filled 2017-12-08: qty 10

## 2017-12-08 MED ORDER — TICAGRELOR 90 MG PO TABS
ORAL_TABLET | ORAL | Status: DC | PRN
  Administered 2017-12-08: 180 mg via ORAL

## 2017-12-08 MED ORDER — NITROGLYCERIN 0.4 MG SL SUBL: 0.4000 mg | SUBLINGUAL_TABLET | SUBLINGUAL | Status: DC | PRN

## 2017-12-08 MED ORDER — VERAPAMIL HCL 2.5 MG/ML IV SOLN
INTRAVENOUS | Status: AC
  Filled 2017-12-08: qty 2

## 2017-12-08 MED ORDER — VERAPAMIL HCL 2.5 MG/ML IV SOLN
INTRAVENOUS | Status: DC | PRN
  Administered 2017-12-08: 10 mL via INTRA_ARTERIAL

## 2017-12-08 MED ORDER — HEPARIN (PORCINE) IN NACL 100-0.45 UNIT/ML-% IJ SOLN
750.0000 [IU]/h | INTRAMUSCULAR | Status: DC
  Filled 2017-12-08: qty 250

## 2017-12-08 MED ORDER — METOPROLOL TARTRATE 5 MG/5ML IV SOLN
5.0000 mg | Freq: Once | INTRAVENOUS | Status: AC
  Administered 2017-12-08: 5 mg via INTRAVENOUS
  Filled 2017-12-08: qty 5

## 2017-12-08 MED ORDER — HEPARIN SODIUM (PORCINE) 5000 UNIT/ML IJ SOLN: 5000.0000 [IU] | Freq: Three times a day (TID) | INTRAMUSCULAR | Status: DC

## 2017-12-08 MED ORDER — NITROGLYCERIN IN D5W 200-5 MCG/ML-% IV SOLN
INTRAVENOUS | Status: AC | PRN
  Administered 2017-12-08: 10 ug/min via INTRAVENOUS

## 2017-12-08 MED ORDER — HEPARIN (PORCINE) IN NACL 1000-0.9 UT/500ML-% IV SOLN
INTRAVENOUS | Status: DC | PRN
  Administered 2017-12-08 (×3): 500 mL

## 2017-12-08 MED ORDER — HEPARIN (PORCINE) IN NACL 1000-0.9 UT/500ML-% IV SOLN
INTRAVENOUS | Status: AC
  Filled 2017-12-08: qty 1000

## 2017-12-08 SURGICAL SUPPLY — 14 items
BALLN SAPPHIRE 2.0X12 (BALLOONS) ×2
BALLOON SAPPHIRE 2.0X12 (BALLOONS) IMPLANT
CATH OPTITORQUE TIG 4.0 5F (CATHETERS) ×1 IMPLANT
CATH VISTA GUIDE 6FR XB3.5 (CATHETERS) ×1 IMPLANT
DEVICE RAD COMP TR BAND LRG (VASCULAR PRODUCTS) ×1 IMPLANT
GLIDESHEATH SLEND A-KIT 6F 20G (SHEATH) ×1 IMPLANT
GUIDEWIRE INQWIRE 1.5J.035X260 (WIRE) IMPLANT
INQWIRE 1.5J .035X260CM (WIRE) ×2
KIT ENCORE 26 ADVANTAGE (KITS) ×1 IMPLANT
KIT HEART LEFT (KITS) ×2 IMPLANT
PACK CARDIAC CATHETERIZATION (CUSTOM PROCEDURE TRAY) ×2 IMPLANT
TRANSDUCER W/STOPCOCK (MISCELLANEOUS) ×2 IMPLANT
TUBING CIL FLEX 10 FLL-RA (TUBING) ×2 IMPLANT
WIRE COUGAR XT STRL 190CM (WIRE) ×1 IMPLANT

## 2017-12-08 NOTE — Progress Notes (Signed)
ANTICOAGULATION CONSULT NOTE - Initial Consult  Pharmacy Consult for IV Heparin Indication: chest pain/ACS  No Known Allergies  Patient Measurements: Height: 5\' 3"  (160 cm) Weight: 135 lb (61.2 kg) IBW/kg (Calculated) : 52.4 Heparin Dosing Weight: actual wt  Vital Signs: Temp: 97.4 F (36.3 C) (08/25 2206) Temp Source: Oral (08/25 2206) BP: 165/88 (08/25 2206) Pulse Rate: 84 (08/25 2206)  Labs: No results for input(s): HGB, HCT, PLT, APTT, LABPROT, INR, HEPARINUNFRC, HEPRLOWMOCWT, CREATININE, CKTOTAL, CKMB, TROPONINI in the last 72 hours.  CrCl cannot be calculated (Patient's most recent lab result is older than the maximum 21 days allowed.).   Medical History: History reviewed. No pertinent past medical history.  Medications:  Scheduled:  . heparin  4,000 Units Intravenous Once   Infusions:  . sodium chloride 10 mL/hr at 12/08/17 2231  . heparin      Assessment: 66 yoF c/o sharp central pain with SOB. IV heparin for ACS.  Goal of Therapy:  Heparin level 0.3-0.7 units/ml Monitor platelets by anticoagulation protocol: Yes   Plan:  4000 unit IV bolus x1 now Start drip at 750 units/hr Daily CBC/HL Check 1st HL in 6 hours  Lorenza EvangelistGreen, Elfida Shimada R 12/08/2017,10:34 PM

## 2017-12-08 NOTE — ED Triage Notes (Signed)
Patient c/o sharp central chest pain with SOB that began while doing sit ups this evening. Patient is diaphoretic.

## 2017-12-08 NOTE — ED Notes (Signed)
Called  EMS [phone] for Code Stemi.

## 2017-12-08 NOTE — ED Notes (Signed)
Patient moved to a room with a code stemi. Blood was drawn in Triage along with an EKG. 2 IV's were established both 20 in bilateral AC's. Patient was wreathing with chest pain. [facility] units of Heparin was given 2220 later followed by another dose of Heparin at 4,000 units. Asprin was given PO and Metoprolol was given IV. Nitro drip was started before EMS arrived. [facility].

## 2017-12-08 NOTE — H&P (Signed)
Suzanne Hunt is an 67 y.o. female.   Chief Complaint: Chest pain HPI: Suzanne Hunt  is a 67 y.o. female  With hyperlipidemia, otherwise no diabetes or hypertension, presented to [facility] with crushing chest discomfort in the form of tightness in the middle of the chest after she was doing sit ups.  At this was associated with marked diaphoresis.  In the emergency room she was found to have ST elevation in V1 to V2 with profound ST depression in 1 and aVL and inferior leads and lateral leads.  STEMI was activated.  She was still having chest pain when she arrived at the cardiac catheterization lab but it started to feel better.  Presently denies nausea or vomiting, denies headache, visual disturbances, no leg edema, no hemoptysis.   Past medical history: Hyperlipidemia,  History reviewed. No pertinent surgical history.  Family History  Problem Relation Age of Onset  . Breast cancer Sister 2  . Breast cancer Paternal Aunt 73   Social History:  reports that she has never smoked. She does not have any smokeless tobacco history on file. She reports that she does not drink alcohol or use drugs.  Allergies: No Known Allergies  Review of Systems  Constitutional: Negative.   HENT: Negative.   Eyes: Negative.   Respiratory: Negative.   Cardiovascular: Positive for chest pain.  Gastrointestinal: Negative.   Skin: Negative.   Neurological: Negative.   Endo/Heme/Allergies: Negative.   Psychiatric/Behavioral: Negative.   All other systems reviewed and are negative.  Blood pressure (!) 165/88, pulse 84, temperature (!) 97.4 F (36.3 C), temperature source Oral, resp. rate 20, height _0  (1.6 m), weight 61.2 kg, SpO2 100 %. Body mass index is 23.91 kg/m. Physical Exam  Constitutional: She is oriented to person, place, and time. She appears well-developed and well-nourished.  HENT:  Head: Atraumatic.  Eyes: Conjunctivae are normal.  Neck: Neck supple. No JVD present. No  thyromegaly present.  Cardiovascular: Normal rate, regular rhythm, normal heart sounds and intact distal pulses. Exam reveals no gallop and no friction rub.  No murmur heard. Pulmonary/Chest: Effort normal and breath sounds normal. No respiratory distress.  Abdominal: Soft. Bowel sounds are normal.  Musculoskeletal: Normal range of motion. She exhibits no edema.  Neurological: She is alert and oriented to person, place, and time.  Skin: Skin is warm and dry.  Psychiatric: She has a normal mood and affect.    Results for orders placed or performed during the hospital encounter of 12/08/17 (from the past 48 hour(s))  I-stat troponin, ED     Status: None   Collection Time: 12/08/17 10:16 PM  Result Value Ref Range   Troponin i, poc 0.01 0.00 - 0.08 ng/mL   Comment 3            Comment: Due to the release kinetics of cTnI, a negative result within the first hours of the onset of symptoms does not rule out myocardial infarction with certainty. If myocardial infarction is still suspected, repeat the test at appropriate intervals.   CBC with Differential/Platelet     Status: Abnormal   Collection Time: 12/08/17 10:16 PM  Result Value Ref Range   WBC 5.3 4.0 - 10.5 K/uL   RBC 4.03 3.87 - 5.11 MIL/uL   Hemoglobin 12.3 12.0 - 15.0 g/dL   HCT 37.8 36.0 - 46.0 %   MCV 93.8 78.0 - 100.0 fL   MCH 30.5 26.0 - 34.0 pg   MCHC 32.5 30.0 - 36.0 g/dL  RDW 14.0 11.5 - 15.5 %   Platelets 206 150 - 400 K/uL   Neutrophils Relative % 23 %   Neutro Abs 1.2 (L) 1.7 - 7.7 K/uL   Lymphocytes Relative 66 %   Lymphs Abs 3.5 0.7 - 4.0 K/uL   Monocytes Relative 10 %   Monocytes Absolute 0.5 0.1 - 1.0 K/uL   Eosinophils Relative 1 %   Eosinophils Absolute 0.1 0.0 - 0.7 K/uL   Basophils Relative 0 %   Basophils Absolute 0.0 0.0 - 0.1 K/uL    Comment: Performed at [facility], [place] [address] [place], [place] [address]  Protime-INR     Status: None   Collection Time: 12/08/17 10:16  PM  Result Value Ref Range   Prothrombin Time 12.4 11.4 - 15.2 seconds   INR 0.93     Comment: Performed at [facility], [place] [address] [place], [place] [address]  APTT     Status: None   Collection Time: 12/08/17 10:16 PM  Result Value Ref Range   aPTT 25 24 - 36 seconds    Comment: Performed at [facility], [place] [address], [place], [place] [address]  Comprehensive metabolic panel     Status: Abnormal   Collection Time: 12/08/17 10:16 PM  Result Value Ref Range   Sodium 141 135 - 145 mmol/L   Potassium 3.6 3.5 - 5.1 mmol/L   Chloride 105 98 - 111 mmol/L   CO2 25 22 - 32 mmol/L   Glucose, Bld 127 (H) 70 - 99 mg/dL   BUN 25 (H) 8 - 23 mg/dL   Creatinine, Ser 1.13 (H) 0.44 - 1.00 mg/dL   Calcium 9.0 8.9 - 10.3 mg/dL   Total Protein 6.9 6.5 - 8.1 g/dL   Albumin 3.7 3.5 - 5.0 g/dL   AST 32 15 - 41 U/L   ALT 23 0 - 44 U/L   Alkaline Phosphatase 85 38 - 126 U/L   Total Bilirubin 0.6 0.3 - 1.2 mg/dL   GFR calc non Af Amer 50 (L) >60 mL/min   GFR calc Af Amer 58 (L) >60 mL/min    Comment: (NOTE) The eGFR has been calculated using the CKD EPI equation. This calculation has not been validated in all clinical situations. eGFR's persistently <60 mL/min signify possible Chronic Kidney Disease.    Anion gap 11 5 - 15    Comment: Performed at [facility], [place] [address] [place], [place] [address]  Troponin I     Status: None   Collection Time: 12/08/17 10:16 PM  Result Value Ref Range   Troponin I <0.03 <0.03 ng/mL    Comment: Performed at [facility], [place] [address] [place], [place] [address]  Lipid panel     Status: Abnormal   Collection Time: 12/08/17 10:16 PM  Result Value Ref Range   Cholesterol 175 0 - 200 mg/dL   Triglycerides 64 <150 mg/dL   HDL 59 >40 mg/dL   Total CHOL/HDL Ratio 3.0 RATIO   VLDL 13 0 - 40 mg/dL   LDL Cholesterol 103 (H) 0 - 99 mg/dL    Comment:        Total  Cholesterol/HDL:CHD Risk Coronary Heart Disease Risk Table                     Men   Women  1/2 Average Risk   3.4   3.3  Average Risk  5.0   4.4  2 X Average Risk   9.6   7.1  3 X Average Risk  23.4   11.0        Use the calculated Patient Ratio above and the CHD Risk Table to determine the patient's CHD Risk.        ATP III CLASSIFICATION (LDL):  <100     mg/dL   Optimal  100-129  mg/dL   Near or Above                    Optimal  130-159  mg/dL   Borderline  160-189  mg/dL   High  >190     mg/dL   Very High Performed at [place] [address], [place], [place] [address]    Labs:   Lab Results  Component Value Date   WBC 5.3 12/08/2017   HGB 12.3 12/08/2017   HCT 37.8 12/08/2017   MCV 93.8 12/08/2017   PLT 206 12/08/2017    Current Facility-Administered Medications:  .  0.9 %  sodium chloride infusion, , Intravenous, Continuous, Virgel Manifold, MD, Last Rate: 10 mL/hr at 12/08/17 2231 .  heparin ADULT infusion 100 units/mL (25000 units/254m sodium chloride 0.45%), 750 Units/hr, Intravenous, Continuous, [facility] Current Outpatient Medications:  .  aspirin 81 MG chewable tablet, Chew 81 mg by mouth daily., Disp: , Rfl:  .  CALCIUM PO, Take 1 tablet by mouth daily., Disp: , Rfl:  .  Cholecalciferol (VITAMIN D PO), Take 1 tablet by mouth daily., Disp: , Rfl:  .  diphenhydrAMINE (BENADRYL) 50 MG capsule, Take 1 capsule (50 mg total) by mouth every 6 (six) hours., Disp: 20 capsule, Rfl: 0 .  EPINEPHrine 0.3 mg/0.3 mL IJ SOAJ injection, Inject 0.3 mLs (0.3 mg total) into the muscle once., Disp: 1 Device, Rfl: 0 .  esomeprazole (NEXIUM) 40 MG capsule, Take 40 mg by mouth daily as needed. For heartburn, Disp: , Rfl:  .  famotidine (PEPCID) 40 MG tablet, Take 1 tablet (40 mg total) by mouth 2 (two) times daily., Disp: 28 tablet, Rfl: 0 .  ibandronate (BONIVA) 150 MG tablet, Take 150 mg by mouth every 30 (thirty) days. Take in the  morning with a full glass of water, on an empty stomach, and do not take anything else by mouth or lie down for the next 30 min. Takes the first week of the month., Disp: , Rfl:  .  Multiple Vitamin (MULTIVITAMIN WITH MINERALS) TABS, Take 1 tablet by mouth daily., Disp: , Rfl:  .  omega-3 acid ethyl esters (LOVAZA) 1 G capsule, Take 1 g by mouth daily., Disp: , Rfl:  .  simvastatin (ZOCOR) 20 MG tablet, Take 20 mg by mouth daily at 6 PM., Disp: , Rfl:   CARDIAC STUDIES:  EKG Baseline artifact.  12/09/2017: Normal sinus rhythm, ST elevation in V1 and V2, ST depression in high lateral leads and inferior leads and lateral leads.  Concerning for anterior STEMI.  ECHO  pending  Assessment/Plan 1. STEMI involving LAD, with ongoing chest pain. 2.  Hyperlipidemia  Recommendation: Patient will be taken emergently to the cardiac catheterization lab to evaluate coronary anatomy and further recommendations will follow.  JAdrian Prows MD 12/08/2017, 10:34 PM PLake WildwoodCardiovascular. PWaltonPager: [phone] Office: [phone] no answer: Cell:  [phone]

## 2017-12-08 NOTE — ED Provider Notes (Signed)
[facility]-EMERGENCY DEPT Provider Note   CSN: [id number] Arrival date & time: 12/08/17  2159     History   Chief Complaint Chief Complaint  Patient presents with  . Chest Pain    HPI Shany Marinez is a 67 y.o. female.  HPI  67 year old female with chest pain.  Onset shortly before arrival while doing sit ups.  She describes a lot of pressure in the center of her chest.  Does not radiate.  Has been constant since onset.  Associated with diaphoresis.   History of high cholesterol.  No known CAD that she is aware of.  She denies any prior evaluation by cardiology.  History reviewed. No pertinent past medical history.  There are no active problems to display for this patient.   History reviewed. No pertinent surgical history.   OB History   None      Home Medications    Prior to Admission medications   Medication Sig Start Date End Date Taking? Authorizing Provider  aspirin 81 MG chewable tablet Chew 81 mg by mouth daily.    [provider]  CALCIUM PO Take 1 tablet by mouth daily.    [provider]  Cholecalciferol (VITAMIN D PO) Take 1 tablet by mouth daily.    [provider]  diphenhydrAMINE (BENADRYL) 50 MG capsule Take 1 capsule (50 mg total) by mouth every 6 (six) hours. 11/30/14   Lyndal Pulley, MD  EPINEPHrine 0.3 mg/0.3 mL IJ SOAJ injection Inject 0.3 mLs (0.3 mg total) into the muscle once. 11/30/14   Lyndal Pulley, MD  esomeprazole (NEXIUM) 40 MG capsule Take 40 mg by mouth daily as needed. For heartburn    [provider]  famotidine (PEPCID) 40 MG tablet Take 1 tablet (40 mg total) by mouth 2 (two) times daily. 11/30/14   Lyndal Pulley, MD  ibandronate (BONIVA) 150 MG tablet Take 150 mg by mouth every 30 (thirty) days. Take in the morning with a full glass of water, on an empty stomach, and do not take anything else by mouth or lie down for the next 30 min. Takes the first week of the month.     [provider]  Multiple Vitamin (MULTIVITAMIN WITH MINERALS) TABS Take 1 tablet by mouth daily.    [provider]  omega-3 acid ethyl esters (LOVAZA) 1 G capsule Take 1 g by mouth daily.    [provider]  simvastatin (ZOCOR) 20 MG tablet Take 20 mg by mouth daily at 6 PM.    [provider]    Family History Family History  Problem Relation Age of Onset  . Breast cancer Sister 12  . Breast cancer Paternal Aunt 24    Social History Social History   Tobacco Use  . Smoking status: Never Smoker  Substance Use Topics  . Alcohol use: No  . Drug use: No     Allergies   Patient has no known allergies.   Review of Systems Review of Systems  All systems reviewed and negative, other than as noted in HPI.  Physical Exam Updated Vital Signs BP (!) 165/88 (BP Location: Right Arm)   Pulse 84   Temp (!) 97.4 F (36.3 C) (Oral)   Resp 20   SpO2 100%   Physical Exam  Constitutional: She appears well-developed and well-nourished. She appears distressed.  HENT:  Head: Normocephalic and atraumatic.  Eyes: Conjunctivae are normal. Right eye exhibits no discharge. Left eye exhibits no discharge.  Neck: Neck  supple.  Cardiovascular: Normal rate, regular rhythm and normal heart sounds. Exam reveals no gallop and no friction rub.  No murmur heard. Pulmonary/Chest: Effort normal and breath sounds normal. No respiratory distress.  Abdominal: Soft. She exhibits no distension. There is no tenderness.  Musculoskeletal: She exhibits no edema or tenderness.  Lower extremities symmetric as compared to each other. No calf tenderness. Negative Homan's. No palpable cords.   Neurological: She is alert.  Skin: Skin is warm and dry.  Psychiatric: She has a normal mood and affect. Her behavior is normal. Thought content normal.  Nursing note and vitals reviewed.    ED Treatments / Results  Labs (all labs ordered are listed, but only abnormal results are  displayed) Labs Reviewed  CBC WITH DIFFERENTIAL/PLATELET  PROTIME-INR  APTT  COMPREHENSIVE METABOLIC PANEL  TROPONIN I  LIPID PANEL  I-STAT TROPONIN, ED    EKG EKG Interpretation  Date/Time:  "Sunday December 08 2017 22:05:38 EDT Ventricular Rate:  85 PR Interval:    QRS Duration: 78 QT Interval:  378 QTC Calculation: 450 R Axis:   35 Text Interpretation:  Sinus rhythm Atrial premature complex Probable anteroseptal infarct, recent Confirmed by Revella Shelton ([id number]) on 12/08/2017 10:30:36 PM   Radiology No results found.  Procedures Procedures (including critical care time)  CRITICAL CARE Performed by: Mylo Driskill Total critical care time: 35 minutes Critical care time was exclusive of separately billable procedures and treating other patients. Critical care was necessary to treat or prevent imminent or life-threatening deterioration. Critical care was time spent personally by me on the following activities: development of treatment plan with patient and/or surrogate as well as nursing, discussions with consultants, evaluation of patient's response to treatment, examination of patient, obtaining history from patient or surrogate, ordering and performing treatments and interventions, ordering and review of laboratory studies, ordering and review of radiographic studies, pulse oximetry and re-evaluation of patient's condition.   Medications Ordered in ED Medications  aspirin chewable tablet 324 mg (has no administration in time range)  nitroGLYCERIN 50 mg in dextrose 5 % 250 mL (0.2 mg/mL) infusion (has no administration in time range)  metoprolol tartrate (LOPRESSOR) injection 5 mg (has no administration in time range)  0.9 %  sodium chloride infusion (has no administration in time range)  heparin injection 60 Units/kg (has no administration in time range)     Initial Impression / Assessment and Plan / ED Course  I have reviewed the triage vital signs and the nursing  notes.  Pertinent labs & imaging results that were available during my care of the patient were reviewed by me and considered in my medical decision making (see chart for details).     66" yF with CP. Concerning history and EKG. Not greatest tracing but STE v1/2 and appears to have depression inferiorly. She says symptoms began shortly before arrival but q waves noted. No old for comparison. Ongoing symptoms. Code STEMI activated. ASA. Heparin. Discussed with Dr Jacinto HalimGanji. [facility]  10:23 PM Pt/family updated on plan. Pt now acting somewhat bizarrely. Clenching her fists and moaning. She is awake but not responding verbally to me.  I'm not sure if this is her response to stress? Pain? Her airway is fine. Repeat EKGs essentially the same.  Final Clinical Impressions(s) / ED Diagnoses   Final diagnoses:  ST elevation myocardial infarction (STEMI), unspecified artery [facility](HCC)    ED Discharge Orders    None       Raeford RazorKohut, Domonique Cothran, MD 12/08/17 2240

## 2017-12-09 ENCOUNTER — Inpatient Hospital Stay (HOSPITAL_COMMUNITY): Payer: BC Managed Care – PPO

## 2017-12-09 ENCOUNTER — Other Ambulatory Visit: Payer: Self-pay

## 2017-12-09 ENCOUNTER — Encounter (HOSPITAL_COMMUNITY): Payer: Self-pay

## 2017-12-09 LAB — BASIC METABOLIC PANEL
Anion gap: 4 — ABNORMAL LOW (ref 5–15)
BUN: 17 mg/dL (ref 8–23)
CALCIUM: 8.1 mg/dL — AB (ref 8.9–10.3)
CO2: 21 mmol/L — ABNORMAL LOW (ref 22–32)
Chloride: 110 mmol/L (ref 98–111)
Creatinine, Ser: 0.88 mg/dL (ref 0.44–1.00)
GFR calc Af Amer: >60 mL/min (ref >60)
GLUCOSE: 117 mg/dL — AB (ref 70–99)
Potassium: 4.2 mmol/L (ref 3.5–5.1)
SODIUM: 135 mmol/L (ref 135–145)

## 2017-12-09 LAB — CBC
HCT: 38.4 % (ref 36.0–46.0)
HCT: 37.3 % (ref 36.0–46.0)
HEMOGLOBIN: 11.9 g/dL — AB (ref 12.0–15.0)
Hemoglobin: 12.3 g/dL (ref 12.0–15.0)
MCH: 30.4 pg (ref 26.0–34.0)
MCH: 30.1 pg (ref 26.0–34.0)
MCHC: 32 g/dL (ref 30.0–36.0)
MCHC: 31.9 g/dL (ref 30.0–36.0)
MCV: 94.8 fL (ref 78.0–100.0)
MCV: 94.2 fL (ref 78.0–100.0)
PLATELETS: 185 10*3/uL (ref 150–400)
Platelets: 179 10*3/uL (ref 150–400)
RBC: 4.05 MIL/uL (ref 3.87–5.11)
RBC: 3.96 MIL/uL (ref 3.87–5.11)
RDW: 13.7 % (ref 11.5–15.5)
RDW: 13.5 % (ref 11.5–15.5)
WBC: 5.9 10*3/uL (ref 4.0–10.5)
WBC: 5.8 10*3/uL (ref 4.0–10.5)

## 2017-12-09 LAB — TROPONIN I
TROPONIN I: 0.72 ng/mL — AB (ref <0.03)
TROPONIN I: 42.63 ng/mL — AB (ref <0.03)
TROPONIN I: 38.04 ng/mL — AB (ref <0.03)

## 2017-12-09 LAB — POCT I-STAT, CHEM 8
BUN: 24 mg/dL — ABNORMAL HIGH (ref 8–23)
CALCIUM ION: 1.19 mmol/L (ref 1.15–1.40)
Chloride: 105 mmol/L (ref 98–111)
Creatinine, Ser: 1 mg/dL (ref 0.44–1.00)
Glucose, Bld: 132 mg/dL — ABNORMAL HIGH (ref 70–99)
HCT: 39 % (ref 36.0–46.0)
HEMOGLOBIN: 13.3 g/dL (ref 12.0–15.0)
Potassium: 4.5 mmol/L (ref 3.5–5.1)
SODIUM: 141 mmol/L (ref 135–145)
TCO2: 23 mmol/L (ref 22–32)

## 2017-12-09 LAB — COMPREHENSIVE METABOLIC PANEL
ALBUMIN: 3.5 g/dL (ref 3.5–5.0)
ALT: 26 U/L (ref 0–44)
ANION GAP: 8 (ref 5–15)
AST: 44 U/L — ABNORMAL HIGH (ref 15–41)
Alkaline Phosphatase: 85 U/L (ref 38–126)
BUN: 21 mg/dL (ref 8–23)
CALCIUM: 8.5 mg/dL — AB (ref 8.9–10.3)
CHLORIDE: 107 mmol/L (ref 98–111)
CO2: 24 mmol/L (ref 22–32)
Creatinine, Ser: 1.05 mg/dL — ABNORMAL HIGH (ref 0.44–1.00)
GFR calc non Af Amer: 55 mL/min — ABNORMAL LOW (ref >60)
Glucose, Bld: 128 mg/dL — ABNORMAL HIGH (ref 70–99)
POTASSIUM: 4.4 mmol/L (ref 3.5–5.1)
SODIUM: 139 mmol/L (ref 135–145)
Total Bilirubin: 0.6 mg/dL (ref 0.3–1.2)
Total Protein: 6.4 g/dL — ABNORMAL LOW (ref 6.5–8.1)

## 2017-12-09 LAB — LIPID PANEL
CHOL/HDL RATIO: 3 ratio
CHOLESTEROL: 178 mg/dL (ref 0–200)
Cholesterol: 178 mg/dL (ref 0–200)
HDL: 60 mg/dL (ref >40)
HDL: 58 mg/dL (ref >40)
LDL CALC: 113 mg/dL — AB (ref 0–99)
LDL Cholesterol: 113 mg/dL — ABNORMAL HIGH (ref 0–99)
TRIGLYCERIDES: 23 mg/dL (ref <150)
Total CHOL/HDL Ratio: 3.1 RATIO
Triglycerides: 34 mg/dL (ref <150)
VLDL: 5 mg/dL (ref 0–40)
VLDL: 7 mg/dL (ref 0–40)

## 2017-12-09 LAB — TSH: TSH: 3.796 u[IU]/mL (ref 0.350–4.500)

## 2017-12-09 LAB — GLUCOSE, CAPILLARY: GLUCOSE-CAPILLARY: 114 mg/dL — AB (ref 70–99)

## 2017-12-09 LAB — PROTIME-INR
INR: 1.24
Prothrombin Time: 15.5 seconds — ABNORMAL HIGH (ref 11.4–15.2)

## 2017-12-09 LAB — ECHOCARDIOGRAM COMPLETE
Height: 63 in
Weight: 2158.74 oz

## 2017-12-09 LAB — APTT: aPTT: >200 seconds (ref 24–36)

## 2017-12-09 LAB — HEMOGLOBIN A1C
Hgb A1c MFr Bld: 4.9 % (ref 4.8–5.6)
Mean Plasma Glucose: 93.93 mg/dL

## 2017-12-09 LAB — POCT ACTIVATED CLOTTING TIME: ACTIVATED CLOTTING TIME: 312 s

## 2017-12-09 LAB — HIV ANTIBODY (ROUTINE TESTING W REFLEX): HIV SCREEN 4TH GENERATION: NONREACTIVE

## 2017-12-09 LAB — MRSA PCR SCREENING: MRSA BY PCR: NEGATIVE

## 2017-12-09 MED ORDER — SODIUM CHLORIDE 0.9% FLUSH
3.0000 mL | INTRAVENOUS | Status: DC | PRN
  Administered 2017-12-09 (×2): 3 mL via INTRAVENOUS
  Filled 2017-12-09 (×2): qty 3

## 2017-12-09 MED ORDER — LOSARTAN POTASSIUM 25 MG PO TABS
25.0000 mg | ORAL_TABLET | Freq: Every evening | ORAL | Status: DC
  Administered 2017-12-09: 25 mg via ORAL
  Filled 2017-12-09: qty 1

## 2017-12-09 MED ORDER — OMEGA-3-ACID ETHYL ESTERS 1 G PO CAPS
1.0000 g | ORAL_CAPSULE | Freq: Every day | ORAL | Status: DC
  Administered 2017-12-09 – 2017-12-10 (×2): 1 g via ORAL
  Filled 2017-12-09 (×2): qty 1

## 2017-12-09 MED ORDER — SODIUM CHLORIDE 0.9 % IV SOLN: 250.0000 mL | INTRAVENOUS | Status: DC | PRN

## 2017-12-09 MED ORDER — NITROGLYCERIN IN D5W 200-5 MCG/ML-% IV SOLN
10.0000 ug/min | Freq: Once | INTRAVENOUS | Status: AC
  Administered 2017-12-09: 10 ug/min via INTRAVENOUS

## 2017-12-09 MED ORDER — SODIUM CHLORIDE 0.9 % WEIGHT BASED INFUSION
1.0000 mL/kg/h | INTRAVENOUS | Status: AC
  Administered 2017-12-09: 1 mL/kg/h via INTRAVENOUS

## 2017-12-09 MED ORDER — TICAGRELOR 90 MG PO TABS
90.0000 mg | ORAL_TABLET | Freq: Two times a day (BID) | ORAL | Status: DC
  Administered 2017-12-09 – 2017-12-10 (×3): 90 mg via ORAL
  Filled 2017-12-09 (×3): qty 1

## 2017-12-09 MED ORDER — ASPIRIN 81 MG PO CHEW: 81.0000 mg | CHEWABLE_TABLET | Freq: Every day | ORAL | Status: DC

## 2017-12-09 MED ORDER — POTASSIUM CHLORIDE ER 10 MEQ PO TBCR
20.0000 meq | EXTENDED_RELEASE_TABLET | Freq: Once | ORAL | Status: AC
  Administered 2017-12-09: 20 meq via ORAL
  Filled 2017-12-09 (×2): qty 2

## 2017-12-09 MED ORDER — FUROSEMIDE 10 MG/ML IJ SOLN
20.0000 mg | Freq: Once | INTRAMUSCULAR | Status: AC
  Administered 2017-12-09: 20 mg via INTRAVENOUS
  Filled 2017-12-09: qty 2

## 2017-12-09 MED ORDER — PANTOPRAZOLE SODIUM 40 MG PO TBEC
40.0000 mg | DELAYED_RELEASE_TABLET | Freq: Every day | ORAL | Status: DC
  Administered 2017-12-09 – 2017-12-10 (×2): 40 mg via ORAL
  Filled 2017-12-09 (×2): qty 1

## 2017-12-09 MED ORDER — SODIUM CHLORIDE 0.9% FLUSH
3.0000 mL | Freq: Two times a day (BID) | INTRAVENOUS | Status: DC
  Administered 2017-12-09: 3 mL via INTRAVENOUS

## 2017-12-09 MED ORDER — ROSUVASTATIN CALCIUM 20 MG PO TABS
20.0000 mg | ORAL_TABLET | Freq: Every day | ORAL | Status: DC
  Administered 2017-12-09: 20 mg via ORAL
  Filled 2017-12-09: qty 1

## 2017-12-09 MED FILL — Lidocaine HCl Local Preservative Free (PF) Inj 1%: INTRAMUSCULAR | Qty: 30 | Status: AC

## 2017-12-09 NOTE — Progress Notes (Signed)
Subjective:  C/O wheezing and dyspnea. Cough. No further chest pain.  Objective:  Vital Signs in the last 24 hours: Temp:  [97.4 F (36.3 C)-98.8 F (37.1 C)] 97.7 F (36.5 C) (08/26 1100) Pulse Rate:  [0-95] 58 (08/26 1100) Resp:  [0-41] 17 (08/26 1100) BP: (99-165)/(66-88) 122/70 (08/26 1100) SpO2:  [0 %-100 %] 97 % (08/26 1100) Weight:  [61.2 kg] 61.2 kg (08/26 0011)  Intake/Output from previous day: 08/25 0701 - 08/26 0700 In: 108.9 [I.V.:108.9] Out: -   Physical Exam: Blood pressure 122/70, pulse (!) 58, temperature 97.7 F (36.5 C), temperature source Oral, resp. rate 17, height 5\' 3"  (1.6 m), weight 61.2 kg, SpO2 97 %.  Physical Exam  Constitutional: She is oriented to person, place, and time. She appears well-developed and well-nourished.  HENT:  Head: Normocephalic and atraumatic.  Eyes: Conjunctivae are normal.  Neck: Neck supple. No JVD present. No tracheal deviation present.  Cardiovascular: Normal rate, regular rhythm, normal heart sounds and intact distal pulses. Exam reveals no friction rub.  No murmur heard. Pulmonary/Chest: She has wheezes (bilateral). She has rales (bases).  Abdominal: Soft. Bowel sounds are normal.  Musculoskeletal: Normal range of motion. She exhibits no edema.  Neurological: She is alert and oriented to person, place, and time.  Skin: Skin is warm and dry.  Psychiatric: She has a normal mood and affect.   Lab Results: BMP Recent Labs    12/08/17 2216 12/08/17 2328 12/09/17 0616  NA 141 139 135  K 3.6 4.4 4.2  CL 105 107 110  CO2 25 24 21*  GLUCOSE 127* 128* 117*  BUN 25* 21 17  CREATININE 1.13* 1.05* 0.88  CALCIUM 9.0 8.5* 8.1*  GFRNONAA 50* 55* >60  GFRAA 58* >60 >60    CBC Recent Labs  Lab 12/08/17 2216  12/09/17 0616  WBC 5.3   < > 5.8  RBC 4.03   < > 3.96  HGB 12.3   < > 11.9*  HCT 37.8   < > 37.3  PLT 206   < > 179  MCV 93.8   < > 94.2  MCH 30.5   < > 30.1  MCHC 32.5   < > 31.9  RDW 14.0   < > 13.5   LYMPHSABS 3.5  --   --   MONOABS 0.5  --   --   EOSABS 0.1  --   --   BASOSABS 0.0  --   --    < > = values in this interval not displayed.    HEMOGLOBIN A1C Lab Results  Component Value Date   HGBA1C 4.9 12/08/2017   MPG 93.93 12/08/2017    Cardiac Panel (last 3 results) Recent Labs    12/08/17 2216 12/08/17 2328 12/09/17 0616  TROPONINI <0.03 0.72* 42.63*   TSH Recent Labs    12/09/17 0616  TSH 3.796    Lipid Panel     Component Value Date/Time   CHOL 178 12/09/2017 0616   TRIG 34 12/09/2017 0616   HDL 58 12/09/2017 0616   CHOLHDL 3.1 12/09/2017 0616   VLDL 7 12/09/2017 0616   LDLCALC 113 (H) 12/09/2017 0616     Hepatic Function Panel Recent Labs    12/08/17 2216 12/08/17 2328  PROT 6.9 6.4*  ALBUMIN 3.7 3.5  AST 32 44*  ALT 23 26  ALKPHOS 85 85  BILITOT 0.6 0.6    Imaging: Dg Chest Port 1 View  Result Date: 12/08/2017 CLINICAL DATA:  Lambert ModySharp central chest  pain, shortness of breath EXAM: PORTABLE CHEST 1 VIEW COMPARISON:  None. FINDINGS: Heart is borderline in size. Low lung volumes. Mild vascular congestion and right base atelectasis. No overt edema or effusions. No acute bony abnormality. IMPRESSION: Borderline heart size with mild vascular congestion. Low volumes with right base atelectasis. Electronically Signed   By: Charlett Nose M.D.   On: 12/08/2017 22:36    Cardiac Studies:  EKG:  12/09/2017: NSR @ 62/min. Normal axis. Antero septal infarct old. Deep T inversion V1-6, subendocardial infarct vs anterolateral ischemia. Prolonged QT.   ECHO 12/09/2017: Left ventricle: The cavity size was normal. Systolic function was  moderately reduced. The estimated ejection fraction was in the  range of 35% to 40%. There was a reduced contribution of atrial  contraction to ventricular filling, due to increased ventricular  diastolic pressure or atrial contractile dysfunction. Doppler  parameters are consistent with a reversible restrictive pattern,    indicative of decreased left ventricular diastolic compliance  and/or increased left atrial pressure (grade 3 diastolic  dysfunction). Doppler parameters are consistent with both  elevated ventricular end-diastolic filling pressure and elevated  left atrial filling pressure. - Regional wall motion abnormality: Akinesis of the mid-apical  anterior, mid anterolateral, and apical myocardium. This  abnormality is consistent with infarction in the distribution of  the left anterior descending coronary artery. - Aortic valve: Normal-sized, mildly calcified annulus. Trileaflet. - Mitral valve: There was mild regurgitation. - Tricuspid valve: There was mild-moderate regurgitation.- Pulmonary arteries: Moderate pulmonary hypertension. PA peak  pressure: 49 mm Hg (S).  Scheduled Meds: . aspirin  324 mg Oral NOW   Or  . aspirin  300 mg Rectal NOW  . aspirin  81 mg Oral Daily  . aspirin EC  81 mg Oral Daily  . furosemide  20 mg Intravenous Once  . metoprolol tartrate  12.5 mg Oral BID  . omega-3 acid ethyl esters  1 g Oral Daily  . pantoprazole  40 mg Oral Daily  . potassium chloride  20 mEq Oral Once  . simvastatin  40 mg Oral q1800  . sodium chloride flush  3 mL Intravenous Q12H  . ticagrelor  90 mg Oral BID   Continuous Infusions: . sodium chloride Stopped (12/09/17 1000)  . sodium chloride     PRN Meds:.sodium chloride, ALPRAZolam, nitroGLYCERIN, sodium chloride flush  Assessment/Plan:  1. STEMI involving LAD, surprising for the location of the LAD and amount of troponin lead and wall motin abnormality. 2. Acute systolic diastolic CHF. 3. Hyperlipidemia.  Rec: Will add Metoprolol 12.5 mg BID and low dose ARB. Continue ASA 81 mg and Brilinta, Lasix x1 duet CHF. Transfer to tele. I will discontinue Simvastatin due to Brilinta interaction. Change to Crestor 20 mg. Probable home tomorrow.   Yates Decamp, M.D. 12/09/2017, 12:15 PM [facility] Pager: [phone] Office:  [phone] If no answer: [phone]

## 2017-12-09 NOTE — Progress Notes (Signed)
Patient transferred by nurse to [facility]. Patient has cell phone and paperwork from admission. No other personal belongings. Nurse at bedside.

## 2017-12-09 NOTE — Care Management (Signed)
12-09-17 BENEFITS CHECK :  # 1.  S/W  [facility] RX #                  [phone]  BRILINTA  90 MG  BID COVER- YES CO-PAY- $ 28.00 (  two refill at retail and third refiil m/o ) TIER - 2  DRUG PRIOR APPROVAL- NO  TICAGRELOR: NONE FORMULARY  PREFERRED PHARMACY : YES [facility] M/O 90 DAY SUPPLY  FOR M/O  $ 24.00

## 2017-12-09 NOTE — Progress Notes (Signed)
CRITICAL VALUE ALERT  Critical Value:  Troponin 42.63  Date & Time Notied:  12/09/17 0805 Wynona Canes(Christine, Charge RN)  Provider Notified: Rounding MD  Orders Received/Actions taken: Expected value. Will continue to monitor patient and trend values.

## 2017-12-09 NOTE — Progress Notes (Signed)
  Echocardiogram 2D Echocardiogram has been performed.  Gerda Dissrthur L Rhet Rorke 12/09/2017, 9:36 AM

## 2017-12-09 NOTE — Plan of Care (Signed)

## 2017-12-09 NOTE — Progress Notes (Signed)
[id number] Did not walk with pt as troponin increased to 42.63 and pt has not been seen by cardiology yet. Staff can walk later if orders given. MI education completed with pt except for ex ed. Discussed MI restrictions, NTG use, risk factors, PTCA, brilinta, gave heart healthy diet and discussed CRP 2. Referred to [place] program. Pt is very active and walks about 3 miles before going to gym.  Luetta NuttingCharlene Rubye Strohmeyer RN BSN 12/09/2017 11:03 AM

## 2017-12-09 NOTE — Plan of Care (Signed)
  Problem: Education: Goal: Knowledge of General Education information will improve Description Including pain rating scale, medication(s)/side effects and non-pharmacologic comfort measures Outcome: Completed/Met   Problem: Clinical Measurements: Goal: Respiratory complications will improve Outcome: Completed/Met

## 2017-12-09 NOTE — Progress Notes (Signed)
CRITICAL VALUE ALERT  Critical Value:  Trop = 0.72 and APTT = >200  Date & Time Notied:  12/09/17 0120  Provider Notified: Dr. Deforest HoylesAkhter (Cards Fellow)  Orders Received/Actions taken: No further action; will continue to monitor closely

## 2017-12-09 NOTE — Plan of Care (Signed)
  Problem: Education: Goal: Knowledge of General Education information will improve Description Including pain rating scale, medication(s)/side effects and non-pharmacologic comfort measures Outcome: Progressing   Problem: Clinical Measurements: Goal: Ability to maintain clinical measurements within normal limits will improve Outcome: Progressing Goal: Will remain free from infection Outcome: Progressing Goal: Respiratory complications will improve Outcome: Progressing Goal: Cardiovascular complication will be avoided Outcome: Progressing   Problem: Activity: Goal: Risk for activity intolerance will decrease Outcome: Progressing   Problem: Pain Managment: Goal: General experience of comfort will improve Outcome: Progressing   Problem: Safety: Goal: Ability to remain free from injury will improve Outcome: Progressing

## 2017-12-10 DIAGNOSIS — I4891 Unspecified atrial fibrillation: Secondary | ICD-10-CM | POA: Diagnosis not present

## 2017-12-10 LAB — BASIC METABOLIC PANEL
Anion gap: 10 (ref 5–15)
BUN: 22 mg/dL (ref 8–23)
CALCIUM: 8.7 mg/dL — AB (ref 8.9–10.3)
CO2: 20 mmol/L — ABNORMAL LOW (ref 22–32)
CREATININE: 1.24 mg/dL — AB (ref 0.44–1.00)
Chloride: 108 mmol/L (ref 98–111)
GFR calc Af Amer: 52 mL/min — ABNORMAL LOW (ref >60)
GFR, EST NON AFRICAN AMERICAN: 45 mL/min — AB (ref >60)
GLUCOSE: 92 mg/dL (ref 70–99)
Potassium: 4.1 mmol/L (ref 3.5–5.1)
SODIUM: 138 mmol/L (ref 135–145)

## 2017-12-10 MED ORDER — APIXABAN 5 MG PO TABS
5.0000 mg | ORAL_TABLET | Freq: Two times a day (BID) | ORAL | Status: DC
  Administered 2017-12-10: 5 mg via ORAL
  Filled 2017-12-10: qty 1

## 2017-12-10 MED ORDER — TICAGRELOR 90 MG PO TABS: 90.0000 mg | ORAL_TABLET | Freq: Two times a day (BID) | ORAL | 6 refills | Status: DC

## 2017-12-10 MED ORDER — METOPROLOL TARTRATE 25 MG PO TABS: 12.5000 mg | ORAL_TABLET | Freq: Two times a day (BID) | ORAL | 6 refills | Status: DC

## 2017-12-10 MED ORDER — APIXABAN 5 MG PO TABS: 5.0000 mg | ORAL_TABLET | Freq: Two times a day (BID) | ORAL | 3 refills | Status: DC

## 2017-12-10 MED ORDER — NITROGLYCERIN 0.4 MG SL SUBL: 0.4000 mg | SUBLINGUAL_TABLET | SUBLINGUAL | 6 refills | Status: DC | PRN

## 2017-12-10 MED ORDER — LOSARTAN POTASSIUM 25 MG PO TABS: 25.0000 mg | ORAL_TABLET | Freq: Every evening | ORAL | 6 refills | Status: DC

## 2017-12-10 MED ORDER — ROSUVASTATIN CALCIUM 20 MG PO TABS: 20.0000 mg | ORAL_TABLET | Freq: Every day | ORAL | 6 refills | Status: DC

## 2017-12-10 NOTE — Progress Notes (Signed)
Pt noted to be in a flutter on telemetry. Rhythm change confirmed by EKG. On call MD notified. Pt asymptomatic. Will continue to monitor patient condition.

## 2017-12-10 NOTE — Discharge Summary (Signed)
Physician Discharge Summary  Patient ID: Suzanne Hunt MRN: [id number] DOB/AGE: 1950/09/30 67 y.o.  Admit date: 12/08/2017 Discharge date: 12/10/2017  Admission Diagnoses:  Discharge Diagnoses:  Principal Problem:   STEMI involving left anterior descending coronary artery [facility]) Active Problems:   Atrial fibrillation [facility])   Discharged Condition: good  Hospital Course:  67 year old African-American female with hypertension, hyperlipidemia, admitted to [facility] on 12/08/2017 with chest pain, anterior STEMI. Patient underwent successful primary PCI to mid to distal LAD with balloon angioplasty, with 10% residual stenosis and apical LAD. Echocardiogram showed reduced EF 35-40%, moderate pulmonary hypertension. On 12/10/2017 morning, patient developed coarse atrial fibrillation with controlled ventricular response. Due to controlled rate and he would and instability, and inability to perform cardioversion, it was decided to treat her medically and consider outpatient cardio version on follow-up. She was started on Eliquis for anti-coagulation given her CHA2DS2VASc score of 3. Aspirin was stopped to reduce increased bleeding risk on 3 agents. Patient will be seen in one week for post MI clinic follow-up.  Consults: None  Significant Diagnostic Studies: Labs: Results for Suzanne Hunt (MRN [id number]) as of 12/10/2017 12:31  Ref. Range 12/08/2017 23:28 12/09/2017 06:16  WBC Latest Ref Range: 4.0 - 10.5 K/uL 5.9 5.8  RBC Latest Ref Range: 3.87 - 5.11 MIL/uL 4.05 3.96  Hemoglobin Latest Ref Range: 12.0 - 15.0 g/dL 78.2 95.6 (L)  HCT Latest Ref Range: 36.0 - 46.0 % 38.4 37.3  MCV Latest Ref Range: 78.0 - 100.0 fL 94.8 94.2  MCH Latest Ref Range: 26.0 - 34.0 pg 30.4 30.1  MCHC Latest Ref Range: 30.0 - 36.0 g/dL 21.3 08.6  RDW Latest Ref Range: 11.5 - 15.5 % 13.7 13.5  Platelets Latest Ref Range: 150 - 400 K/uL 185 179   Results for Suzanne Hunt (MRN [id number]) as of  12/10/2017 12:31  Ref. Range 12/08/2017 23:11 12/08/2017 23:28 12/09/2017 06:16 12/09/2017 11:39 12/09/2017 23:21  BASIC METABOLIC PANEL Unknown   Rpt (A)  Rpt (A)  COMPREHENSIVE METABOLIC PANEL Unknown  Rpt (A)     Sodium Latest Ref Range: 135 - 145 mmol/L 141 139 135  138  Potassium Latest Ref Range: 3.5 - 5.1 mmol/L 4.5 4.4 4.2  4.1  Chloride Latest Ref Range: 98 - 111 mmol/L 105 107 110  108  CO2 Latest Ref Range: 22 - 32 mmol/L  24 21 (L)  20 (L)  Glucose Latest Ref Range: 70 - 99 mg/dL 528 (H) 413 (H) 244 (H)  92  Mean Plasma Glucose Latest Units: mg/dL  01.02     BUN Latest Ref Range: 8 - 23 mg/dL 24 (H) 21 17  22   Creatinine Latest Ref Range: 0.44 - 1.00 mg/dL 7.25 3.66 (H) 4.40  3.47 (H)  Calcium Latest Ref Range: 8.9 - 10.3 mg/dL  8.5 (L) 8.1 (L)  8.7 (L)  Anion gap Latest Ref Range: 5 - 15   8 4  (L)  10  Calcium Ionized Latest Ref Range: 1.15 - 1.40 mmol/L 1.19      Alkaline Phosphatase Latest Ref Range: 38 - 126 U/L  85     Albumin Latest Ref Range: 3.5 - 5.0 g/dL  3.5     AST Latest Ref Range: 15 - 41 U/L  44 (H)     ALT Latest Ref Range: 0 - 44 U/L  26     Total Protein Latest Ref Range: 6.5 - 8.1 g/dL  6.4 (L)     Total Bilirubin Latest Ref Range: 0.3 -  1.2 mg/dL  0.6     GFR, Est Non African American Latest Ref Range: >60 mL/min  55 (L) >60  45 (L)  GFR, Est African American Latest Ref Range: >60 mL/min  >60 >60  52 (L)  Troponin I Latest Ref Range: <0.03 ng/mL  0.72 (HH) 42.63 (HH) 38.04 (HH)   Total CHOL/HDL Ratio Latest Units: RATIO  3.0 3.1    Cholesterol Latest Ref Range: 0 - 200 mg/dL  161178 [id number]    HDL Cholesterol Latest Ref Range: >40 mg/dL  60 58    LDL (calc) Latest Ref Range: 0 - 99 mg/dL  045113 (H) 409113 (H)    Triglycerides Latest Ref Range: <150 mg/dL  23 34    VLDL Latest Ref Range: 0 - 40 mg/dL  5 7     Echocardiogram 12/09/2017: Study Conclusions  - Left ventricle: The cavity size was normal. Systolic function was   moderately reduced. The estimated ejection  fraction was in the   range of 35% to 40%. There was a reduced contribution of atrial   contraction to ventricular filling, due to increased ventricular   diastolic pressure or atrial contractile dysfunction. Doppler   parameters are consistent with a reversible restrictive pattern,   indicative of decreased left ventricular diastolic compliance   and/or increased left atrial pressure (grade 3 diastolic   dysfunction). Doppler parameters are consistent with both   elevated ventricular end-diastolic filling pressure and elevated   left atrial filling pressure. - Regional wall motion abnormality: Akinesis of the mid-apical   anterior, mid anterolateral, and apical myocardium. This   abnormality is consistent with infarction in the distribution of   the left anterior descending coronary artery. - Aortic valve: Normal-sized, mildly calcified annulus. Trileaflet. - Mitral valve: There was mild regurgitation. - Tricuspid valve: There was mild-moderate regurgitation. - Pulmonary arteries: Moderate pulmonary hypertension. PA peak   pressure: 49 mm Hg (S).  Impressions:  - The right ventricular systolic pressure was increased consistent   with moderate pulmonary hypertension.  Treatments:  Coronary angiography 12/09/2017:  Dist RCA lesion is 30% stenosed.  Mid LAD to Dist LAD lesion is 100% stenosed. Balloon angioplasty was performed using a BALLOON SAPPHIRE 2.0X12.  Post intervention, there is a 10% residual stenosis at the apical LAD. Excellent blush and timi 3 flow was present.  Prox LAD lesion is 30% stenosed.  LV end diastolic pressure is normal.  There is mild left ventricular systolic dysfunction.  The left ventricular ejection fraction is 45-50% by visual estimate.  Balloon angioplasty was performed using a BALLOON SAPPHIRE 2.0X12.    Recommend uninterrupted dual antiplatelet therapy with Aspirin 81mg  daily and Ticagrelor 90mg  twice daily for a minimum of 12 months  (ACS - Class I recommendation).  Discharge Exam: Blood pressure 107/67, pulse 76, temperature 99 F (37.2 C), temperature source Oral, resp. rate 17, height 5\' 3"  (1.6 m), weight 61.3 kg, SpO2 94 %. Constitutional: She is oriented to person, place, and time. She appears well-developed and well-nourished.  HENT:  Head: Atraumatic.  Eyes: Conjunctivae are normal.  Neck: Neck supple. No JVD present. No thyromegaly present.  Cardiovascular: Normal rate, regular rhythm, normal heart sounds and intact distal pulses. Exam reveals no gallop and no friction rub.  No murmur heard. Pulmonary/Chest: Effort normal and breath sounds normal. No respiratory distress.  Abdominal: Soft. Bowel sounds are normal.  Musculoskeletal: Normal range of motion. She exhibits no edema.  Neurological: She is alert and oriented to person, place, and time.  Skin: Skin is warm and dry.  Psychiatric: She has a normal mood and affect.    Disposition: Discharge disposition: 01-Home or Self Care       Discharge Instructions    AMB Referral to Cardiac Rehabilitation - Phase II   Complete by:  As directed    Diagnosis:  STEMI   Amb Referral to Cardiac Rehabilitation   Complete by:  As directed    Diagnosis:   STEMI PTCA     Diet - low sodium heart healthy   Complete by:  As directed    Increase activity slowly   Complete by:  As directed      Allergies as of 12/10/2017      Reactions   Actinel [[id number]] Other (See Comments)   Flu like symptoms      Medication List    STOP taking these medications   aspirin 81 MG chewable tablet   diphenhydrAMINE 50 MG capsule Commonly known as:  BENADRYL   famotidine 40 MG tablet Commonly known as:  PEPCID   simvastatin 20 MG tablet Commonly known as:  ZOCOR     TAKE these medications   apixaban 5 MG Tabs tablet Commonly known as:  ELIQUIS Take 1 tablet (5 mg total) by mouth 2 (two) times daily.   CALCIUM PO Take 1 tablet by mouth daily.    EPINEPHrine 0.3 mg/0.3 mL Soaj injection Commonly known as:  EPI-PEN Inject 0.3 mLs (0.3 mg total) into the muscle once.   esomeprazole 40 MG capsule Commonly known as:  NEXIUM Take 40 mg by mouth daily as needed. For heartburn   ibandronate 150 MG tablet Commonly known as:  BONIVA Take 150 mg by mouth every 30 (thirty) days. Take in the morning with a full glass of water, on an empty stomach, and do not take anything else by mouth or lie down for the next 30 min. Takes the first week of the month.   losartan 25 MG tablet Commonly known as:  COZAAR Take 1 tablet (25 mg total) by mouth every evening.   metoprolol tartrate 25 MG tablet Commonly known as:  LOPRESSOR Take 0.5 tablets (12.5 mg total) by mouth 2 (two) times daily.   multivitamin with minerals Tabs tablet Take 1 tablet by mouth daily.   nitroGLYCERIN 0.4 MG SL tablet Commonly known as:  NITROSTAT Place 1 tablet (0.4 mg total) under the tongue every 5 (five) minutes x 3 doses as needed for chest pain.   rosuvastatin 20 MG tablet Commonly known as:  CRESTOR Take 1 tablet (20 mg total) by mouth daily at 6 PM.   ticagrelor 90 MG Tabs tablet Commonly known as:  BRILINTA Take 1 tablet (90 mg total) by mouth 2 (two) times daily.   VITAMIN D PO Take 1 tablet by mouth daily.      Follow-up Information    Toniann Fail, NP Follow up on 12/18/2017.   Specialty:  Cardiology Why:  11:00 AM Contact information: [address] [place] [address] [phone]           Signed: Elder Negus 12/10/2017, 12:30 PM  Jeanett Antonopoulos Emiliano Dyer, MD [facility] [place] Pager: [phone] Office: [phone] If no answer Cell [phone]

## 2017-12-10 NOTE — Progress Notes (Signed)
CARDIAC REHAB PHASE I   PRE:  Rate/Rhythm: 72 aflutter  BP:  Supine: 101/65  Sitting:   Standing:    SaO2:   MODE:  Ambulation: 470 ft   POST:  Rate/Rhythm: 101 aflutter  BP:  Supine:   Sitting: 119/83  Standing:    SaO2: 97%RA [id number] Pt walked 470 ft on RA with steady gait and tolerated well. No CP but did state that her lower back was hurting after walk which is unusual for her. Reviewed ex ed and encouraged her not to over do. Answered questions re ed done yesterday,   Luetta Nuttingharlene Jaye Polidori, RN BSN  12/10/2017 9:50 AM

## 2017-12-10 NOTE — Plan of Care (Signed)
  Problem: Activity: Goal: Risk for activity intolerance will decrease Outcome: Completed/Met   Problem: Nutrition: Goal: Adequate nutrition will be maintained Outcome: Completed/Met   Problem: Coping: Goal: Level of anxiety will decrease Outcome: Completed/Met   Problem: Elimination: Goal: Will not experience complications related to bowel motility Outcome: Completed/Met Goal: Will not experience complications related to urinary retention Outcome: Completed/Met   Problem: Safety: Goal: Ability to remain free from injury will improve Outcome: Completed/Met   Problem: Skin Integrity: Goal: Risk for impaired skin integrity will decrease Outcome: Completed/Met   

## 2017-12-10 NOTE — Care Management Note (Addendum)
Case Management Note  Patient Details  Name: Suzanne Hunt MRN: [id number] Date of Birth: 05/05/1950  Subjective/Objective:  [facility]. [facility] Cost will be $30.00 and patient is aware. [facility] has a partial fill for patient to pick up. 30 day free card provided.                   Action/Plan: No further home needs identified.   Expected Discharge Date:  12/11/17               Expected Discharge Plan:  Home/Self Care  In-House Referral:  NA  Discharge planning Services  CM Consult, Medication Assistance  Post Acute Care Choice:  NA Choice offered to:  NA  DME Arranged:  N/A DME Agency:  NA  HH Arranged:  NA HH Agency:  NA  Status of Service:  Completed, signed off  If discussed at Long Length of Stay Meetings, dates discussed:    Additional Comments: 1222 12-10-17 Tomi BambergerBrenda Graves-Bigelow, RN,BSN [phone] Eliquis 30 day free card provided to patient-Co pay $30.00. No further needs at this time.  Gala LewandowskyGraves-Bigelow, Candance Bohlman Kaye, RN 12/10/2017, 10:06 AM

## 2017-12-10 NOTE — Plan of Care (Signed)
  Problem: Health Behavior/Discharge Planning: Goal: Ability to manage health-related needs will improve Outcome: Completed/Met   Problem: Clinical Measurements: Goal: Ability to maintain clinical measurements within normal limits will improve Outcome: Completed/Met Goal: Will remain free from infection Outcome: Completed/Met Goal: Diagnostic test results will improve Outcome: Completed/Met Goal: Cardiovascular complication will be avoided Outcome: Completed/Met   Problem: Activity: Goal: Risk for activity intolerance will decrease Outcome: Completed/Met   Problem: Nutrition: Goal: Adequate nutrition will be maintained Outcome: Completed/Met   Problem: Coping: Goal: Level of anxiety will decrease Outcome: Completed/Met   Problem: Elimination: Goal: Will not experience complications related to bowel motility Outcome: Completed/Met Goal: Will not experience complications related to urinary retention Outcome: Completed/Met   Problem: Pain Managment: Goal: General experience of comfort will improve Outcome: Completed/Met   Problem: Safety: Goal: Ability to remain free from injury will improve Outcome: Completed/Met   Problem: Skin Integrity: Goal: Risk for impaired skin integrity will decrease Outcome: Completed/Met   

## 2017-12-12 ENCOUNTER — Telehealth (HOSPITAL_COMMUNITY): Payer: Self-pay

## 2017-12-12 NOTE — Telephone Encounter (Signed)
Patients insurance is active and benefits verified through [place] - No co-pay, deductible amount of $1,250/$0.00 has been met, out of pocket amount of $4,890/$186.49 has been met, no co-insurance, and no pre-authorization is required. Passport/reference [id number]  Patients insurance is active and benefits verified through Medicare A/B - No co-pay, deductible amount of $185.00/$0.00 has been met, no out of pocket, 20% co-insurance, and no pre-authorization is required. Passport/reference [phone]  Patient will need to call [employer] for benefits and eligibility.  Will contact patient in regards to Cardiac Rehab and see if she is interested. If interested, patient will need to complete follow up appt. Once completed, patient will be contacted for scheduling.

## 2017-12-12 NOTE — Telephone Encounter (Signed)
Made initial call to patient in regards to Cardiac Rehab - Patient stated she would not be able to participate at this time due to work schedule. Closed referral.

## 2017-12-18 DIAGNOSIS — I252 Old myocardial infarction: Secondary | ICD-10-CM | POA: Diagnosis not present

## 2017-12-18 DIAGNOSIS — I25118 Atherosclerotic heart disease of native coronary artery with other forms of angina pectoris: Secondary | ICD-10-CM | POA: Diagnosis not present

## 2017-12-18 DIAGNOSIS — I10 Essential (primary) hypertension: Secondary | ICD-10-CM | POA: Diagnosis not present

## 2017-12-18 DIAGNOSIS — I4891 Unspecified atrial fibrillation: Secondary | ICD-10-CM | POA: Diagnosis not present

## 2018-01-01 DIAGNOSIS — I4891 Unspecified atrial fibrillation: Secondary | ICD-10-CM | POA: Diagnosis not present

## 2018-01-06 NOTE — Progress Notes (Signed)
01/02/2018: Creatinine 1.23, EGFR 46/53, potassium 4.3, BMP otherwise normal.  Labs 12/09/2017:  H/H 12/37.  MCV 94.  Platelets 179, Glucose 92.  BUN/creatinine 22/1.24.  EGFR 52.  Sodium 138, potassium 4.1.  Peak troponin 42.6. Hemoglobin A1c 4.9%. TSH 3.7 normal. Glucose of 178, triglycerides 34, HDL 58, LDL 113.

## 2018-01-06 NOTE — H&P (Signed)
Vincenzina Jagoda 04-Jan-2018 11:30 AM Location: [facility] Patient #: [id number] DOB: March 10, 1951 Married / Language: Undefined / Race: Black or African American Female   History of Present Illness Gwinda Maine FNP-C; 12/19/2017 8:22 AM) Patient words: NP EVAL for Hospital F/U  *Pt prefers student does NOT come in before provider.  The patient is a 67 year old female who presents with coronary artery disease. Ms. Rucha Wissinger is an African-American female with hypertension, hyperlipidemia, she is a nonsmoker admitted to [facility] on 12/08/2017 with chest pain, anterior STEMI, underwent successful primary PCI to mid to distal LAD with balloon angioplasty, with 10% residual stenosis and apical LAD. Echocardiogram showed reduced EF 35-40%, moderate pulmonary hypertension. On 12/10/2017 morning, patient developed coarse atrial fibrillation with controlled ventricular response. Did not have cardioversion due to inavailability of lab and she felt well. She was started on Eliquis for anti-coagulation. Presently on Eliquis and Brilinta. She now presents for follow up.  Patient reports that she is feeling well. Has had one brief episode of chest pain while getting dressed yesterday, but nothing like before. She has been walking and is tolerating this well. She is hoping to start exercising soon, as she previously did this 5 days a week. She states that she will not be able to do cardiac rehab due to her work schedule.   Problem List/Past Medical (April Garrison; 01/04/2018 11:48 AM) History of MI (myocardial infarction) (I25.2) [11/2017]: Laboratory examination (Z01.89)  Labs 12/09/2017: H/H 12/37. MCV 94. Platelets 179 Glucose 92. BUN/creatinine 22/1.24. EGFR 52. Sodium 138, potassium 4.1. Peak troponin 42.6. Hemoglobin A1c 4.9%. TSH 3.7 normal. Atrial fibrillation (I48.91)  Hypercholesteremia (E78.00)  Benign essential hypertension (I10)   Allergies (April Garrison;  2018-01-04 11:33 AM) Actonel *ENDOCRINE AND METABOLIC AGENTS - MISC.*  Flu like symptoms  Family History (April Louretta Shorten; January 04, 2018 11:38 AM) Mother  Deceased. at age 58 from CHF; Hx of HTN, CVA Father  Deceased. at age 66 from DM; No Known Heart conditions Sister 4  2-Older; 2-Younger; No Known Heart conditions Brother 3  1-Older; 2-Younger; No Known Heart conditions  Social History (April Garrison; 01-04-2018 11:34 AM) Current tobacco use  Never smoker. Non Drinker/No Alcohol Use  Marital status  Married. Number of Children  0. Living Situation  Lives with spouse.  Past Surgical History (April Louretta Shorten; 01-04-2018 11:41 AM) None [Jan 04, 2018]:  Medication History Gwinda Maine, FNP-C; 12/19/2017 8:22 AM) Jaclyn Prime (150MG Tablet, 1 Oral monthly) Active. Brilinta (90MG Tablet, 1 Oral two times daily) Active. Eliquis (5MG Tablet, 1 Oral two times daily) Active. Esomeprazole Magnesium (40MG Capsule DR, 1 Oral as needed) Active. Losartan Potassium (25MG Tablet, 1 Oral daily) Active. Rosuvastatin Calcium (20MG Tablet, 1 Oral daily) Active. Metoprolol Tartrate (25MG Tablet, 1/2 Oral two times daily) Active. Nitroglycerin (0.4MG Tab Sublingual, 1 Sublingual every 5 minutes as needed for chest pain.) Active. Medications Reconciled (Pt brought medications)  Diagnostic Studies History [name], MD; Jan 04, 2018 11:22 PM) Echocardiogram [12/09/2017]: 03/50/0938: LV systolic function moderately depressed at 35-40% with grade 3 diastolic dysfunction. Akinesis of the mid apical anterior, mid anterolateral and apical myocardium. Mild to moderate TR and moderate pulmonary hypertension, PA pressure 49 mmHg. Coronary Angiogram [12/08/2017]: 11/25/2017: Mid LAD at rate S/P balloon angioplasty with 2 x 12 Saphire balloon, mild disease in other vessels, mild LV systolic dysfunction, EF 18%. Colonoscopy [2018]: Benign polyps removed    Review of Systems Gwinda Maine FNP-C;  01/04/2018 12:10 PM) General Present- Fatigue. Not Present- Appetite Loss  and Weight Gain. Respiratory Not Present- Chronic Cough and Wakes up from Sleep Wheezing or Short of Breath. Cardiovascular Present- Chest Pain (1 brief episode while getting dressed). Not Present- Difficulty Breathing Lying Down and Difficulty Breathing On Exertion. Gastrointestinal Not Present- Black, Tarry Stool and Difficulty Swallowing. Musculoskeletal Not Present- Decreased Range of Motion and Muscle Atrophy. Neurological Not Present- Attention Deficit. Psychiatric Not Present- Personality Changes and Suicidal Ideation. Endocrine Not Present- Cold Intolerance and Heat Intolerance. Hematology Not Present- Abnormal Bleeding. All other systems negative  Vitals (April Garrison; 12/18/2017 11:55 AM) 12/18/2017 11:54 AM Weight: 132.19 lb Height: 63in Body Surface Area: 1.62 m Body Mass Index: 23.42 kg/m  BP: 140/67 (Sitting, Left Arm, Standard)    12/18/2017 11:28 AM Weight: 132.19 lb Height: 63in Body Surface Area: 1.62 m Body Mass Index: 23.42 kg/m  Pulse: 88 (Regular)  P.OX: 98% (Room air) BP: 127/84 (Sitting, Left Arm, Large)       Physical Exam [name] MD; 12/18/2017 11:15 PM) General Mental Status-Alert. General Appearance-Cooperative and Appears stated age. Build & Nutrition-Well nourished and Moderately built.  Head and Neck Thyroid Gland Characteristics - normal size and consistency and no palpable nodules.  Chest and Lung Exam Chest and lung exam reveals -quiet, even and easy respiratory effort with no use of accessory muscles, non-tender and on auscultation, normal breath sounds, no adventitious sounds.  Cardiovascular Cardiovascular examination reveals -normal heart sounds, regular rate and rhythm with no murmurs, carotid auscultation reveals no bruits, abdominal aorta auscultation reveals no bruits and no prominent pulsation, femoral artery auscultation  bilaterally reveals normal pulses, no bruits, no thrills and normal pedal pulses bilaterally.  Abdomen Palpation/Percussion Palpation and Percussion of the abdomen reveal - Non Tender and No hepatosplenomegaly.  Peripheral Vascular Lower Extremity Palpation - Radial pulse - Right - Normal.  Neurologic Neurologic evaluation reveals -alert and oriented x 3 with no impairment of recent or remote memory. Motor-Grossly intact without any focal deficits.  Musculoskeletal Global Assessment Left Lower Extremity - no deformities, masses or tenderness, no known fractures. Right Lower Extremity - no deformities, masses or tenderness, no known fractures.    Assessment & Plan Gwinda Maine FNP-C; 12/19/2017 8:25 AM) Coronary artery disease of native artery of native heart with stable angina pectoris (I25.118) Story: Coronary angiogram 11/25/2017: Mid LAD at rate S/P balloon angioplasty with 2 x 12 Saphire balloon, mild disease in other vessels, mild LV systolic dysfunction, EF 35%. Atrial fibrillation with controlled ventricular rate (I48.91) Story: EKG 12/18/2017: Coarse atrial fibrillation with controlled ventricular response at the rate of 77 bpm, normal axis, anterolateral infarct old with persistent deep T wave inversion in V1 to V6. Abnormal EKG. Current Plans Complete electrocardiogram (93000) Future Plans 01/28/2018: Echocardiography, transthoracic, real-time with image documentation (2D), includes M-mode recording, when performed, complete, with spectral Doppler echocardiography, and with color flow Doppler echocardiography (46568) - one time 05/12/5168: METABOLIC PANEL, BASIC (01749) - one time History of MI (myocardial infarction) (I25.2) Benign essential hypertension (I10) Story: Echocardiogram 44/96/7591: LV systolic function moderately depressed at 35-40% with grade 3 diastolic dysfunction. Akinesis of the mid apical anterior, mid anterolateral and apical myocardium. Mild to  moderate TR and moderate pulmonary hypertension, PA pressure 49 mmHg. Hypercholesteremia (E78.00) Laboratory examination (Z01.89) Story: Labs 12/09/2017: H/H 12/37. MCV 94. Platelets 179, Glucose 92. BUN/creatinine 22/1.24. EGFR 52. Sodium 138, potassium 4.1. Peak troponin 42.6. Hemoglobin A1c 4.9%. TSH 3.7 normal. Glucose of 178, triglycerides 34, HDL 58, LDL 113.  Note:. Recommendation:  Ms. Berlynn Warsame is  an African-American female with hypertension, hyperlipidemia, she is a nonsmoker admitted to [facility] on 12/08/2017 with chest pain, anterior STEMI, underwent successful primary PCI to mid to distal LAD with balloon angioplasty. Echocardiogram showed reduced EF 35-40%, patient developed coarse atrial fibrillation with controlled ventricular response. Did not have cardioversion due to inavailability of lab and she felt well. She was started on Eliquis for anti-coagulation. Presently on Eliquis and Brilinta. She now presents for follow up.  Patient reports that she is feeling well. Has had one brief episode of chest pain while getting dressed yesterday, but nothing like before. She has been walking and is tolerating this well. She is hoping to start exercising soon.  Patient is presently doing well without any specific complaints today. Has not had any symptoms of angina pectoris since being at home. I have encouraged her to start cardiac rehabilitation, but due to work commitments will not be able to do this. She is doing well with her walking, and encouraged her to continue doing this in a few weeks may resume normal exercises as tolerated. She is currently on Brilinta and is tolerating this well. She will need to continue this for at least one year.  She continues to have atrial fibrillation versus atrial flutter by EKG today. I discussed options of cardioversion versus cardiac ablation. She wishes to proceed with cardioversion and will arrange for this to be performed in  the next few weeks. Once cardioversion is completed, would consider continuing anticoagulation for longer-term as I cannot exclude coarse A fib. Will repeat echocardiogram after her cardioversion in the middle of October for follow-up on heart failure. Continue with losartan. Blood pressure is stable. We'll see her back after cardioversion for reevaluation and further recommendations.  She is now tolerating higher dose of statin without side effects. Blood pressure is well controlled. Continue present medications. She will also need lipid profile testing in 6-[redacted] weeks along with CBC as patient is on Brilinta and Eliquis. This was a greater than 40 minute office visit with greater than 50% of the time spent with face-to-face encounter with patient and evaluation of complex medical issues, review of external records and coordination of care.  *I have discussed this case with Dr. Virgina Jock and he personally examined the patient and participated in formulating the plan.*  CC: Dr. Donald Prose (PCP)    Signed by Gwinda Maine, FNP-C (12/19/2017 8:26 AM)

## 2018-01-07 ENCOUNTER — Encounter (HOSPITAL_COMMUNITY)
Admission: RE | Disposition: A | Discharge status: Home / Self Care | Payer: Self-pay | Source: Ambulatory Visit | Attending: Cardiology

## 2018-01-07 ENCOUNTER — Ambulatory Visit (HOSPITAL_COMMUNITY): Payer: BC Managed Care – PPO | Admitting: Anesthesiology

## 2018-01-07 ENCOUNTER — Ambulatory Visit (HOSPITAL_COMMUNITY)
Admission: RE | Admit: 2018-01-07 | Discharge: 2018-01-07 | Disposition: A | Discharge status: Home / Self Care | Payer: BC Managed Care – PPO | Source: Ambulatory Visit | Attending: Cardiology | Admitting: Cardiology

## 2018-01-07 ENCOUNTER — Encounter (HOSPITAL_COMMUNITY): Payer: Self-pay | Admitting: *Deleted

## 2018-01-07 DIAGNOSIS — Z01810 Encounter for preprocedural cardiovascular examination: Secondary | ICD-10-CM | POA: Insufficient documentation

## 2018-01-07 DIAGNOSIS — R001 Bradycardia, unspecified: Secondary | ICD-10-CM | POA: Diagnosis not present

## 2018-01-07 DIAGNOSIS — R9431 Abnormal electrocardiogram [ECG] [EKG]: Secondary | ICD-10-CM | POA: Insufficient documentation

## 2018-01-07 SURGERY — CANCELLED PROCEDURE

## 2018-01-07 NOTE — Progress Notes (Signed)
Patient admitted to Endo for a cardioversion. Patient placed on the monitor and appeared to be in sinus bradycardia. EKG and MD confirmed SB. Patient discharged to home prior to entering procedure room.

## 2018-02-06 DIAGNOSIS — E7800 Pure hypercholesterolemia, unspecified: Secondary | ICD-10-CM | POA: Diagnosis not present

## 2018-02-06 DIAGNOSIS — I4891 Unspecified atrial fibrillation: Secondary | ICD-10-CM | POA: Diagnosis not present

## 2018-02-08 DIAGNOSIS — I351 Nonrheumatic aortic (valve) insufficiency: Secondary | ICD-10-CM | POA: Diagnosis present

## 2018-02-08 NOTE — H&P (Signed)
OFFICE VISIT NOTES COPIED TO EPIC FOR DOCUMENTATION  . History of Present Illness Suzanne Page MD; 02/10/2018 4:58 PM) Patient words: f/u echo; Last OV 12/18/2017.  The patient is a 67 year old female who presents for a Follow-up for Coronary artery disease. Ms. Suzanne Hunt is an African-American female with hypertension, hyperlipidemia, she is a nonsmoker admitted to [facility] on 12/08/2017 anterior STEMI,S/P PCI to mid to distal LAD with balloon angioplasty. She developed coarse atrial fibrillation with controlled ventricular response and presently on Eliquis and Brilinta. She had Echocardiogram on 01/29/2018 presents for follow-up.   Essentially asymptomatic. She was set up for outpatient direct current cardioversion 3 weeks ago, but she was spontaneously in sinus rhythm. She states that she will not be able to do cardiac rehab due to her work schedule.   Problem List/Past Medical Suzanne Hunt; 02/10/2018 11:04 AM) Atrial fibrillation (I48.91)  Laboratory examination (Z01.89)  01/02/2018: Creatinine 1.23, EGFR 46/53, potassium 4.3, BMP otherwise normal. Labs 12/09/2017: H/H 12/37. MCV 94. Platelets 179, Glucose 92. BUN/creatinine 22/1.24. EGFR 52. Sodium 138, potassium 4.1. Peak troponin 42.6. Hemoglobin A1c 4.9%. TSH 3.7 normal. Glucose of 178, triglycerides 34, HDL 58, LDL 113. Coronary artery disease of native artery of native heart with stable angina pectoris (I25.118)  Coronary angiogram 11/25/2017: Mid LAD at rate S/P balloon angioplasty with 2 x 12 Saphire balloon, mild disease in other vessels, mild LV systolic dysfunction, EF 94%. Benign essential hypertension (I10)  Echocardiogram 70/96/2836: LV systolic function moderately depressed at 35-40% with grade 3 diastolic dysfunction. Akinesis of the mid apical anterior, mid anterolateral and apical myocardium. Mild to moderate TR and moderate pulmonary hypertension, PA pressure 49 mmHg. Hypercholesteremia (E78.00)   History of MI (myocardial infarction) (I25.2) [11/2017]:  Allergies Suzanne Hunt; 02/10/18 11:04 AM) Actonel *ENDOCRINE AND METABOLIC AGENTS - MISC.*  Flu like symptoms  Family History Suzanne Hunt; 02-10-18 11:04 AM) Mother  Deceased. at age 38 from CHF; Hx of HTN, CVA Father  Deceased. at age 50 from DM; No Known Heart conditions Sister 4  2-Older; 2-Younger; No Known Heart conditions Brother 3  1-Older; 2-Younger; No Known Heart conditions  Social History Suzanne Hunt; 2018-02-10 11:04 AM) Current tobacco use  Never smoker. Non Drinker/No Alcohol Use  Marital status  Married. Number of Children  0. Living Situation  Lives with spouse.  Past Surgical History Suzanne Hunt; February 10, 2018 11:04 AM) None [12/18/2017]:  Medication History Suzanne Hunt; 02/10/2018 11:11 AM) Boniva (150MG Tablet, 1 Oral monthly) Active. Brilinta (90MG Tablet, 1 Oral two times daily) Active. Eliquis (5MG Tablet, 1 Oral two times daily) Active. Esomeprazole Magnesium (40MG Capsule DR, 1 Oral as needed) Active. Losartan Potassium (25MG Tablet, 1 Oral daily) Active. Rosuvastatin Calcium (20MG Tablet, 1 Oral daily) Active. Metoprolol Tartrate (25MG Tablet, 1/2 Oral two times daily) Active. Nitroglycerin (0.4MG Tab Sublingual, 1 Sublingual every 5 minutes as needed for chest pain.) Active. Medications Reconciled (Pt brought medications)  Diagnostic Studies History Suzanne Page, MD; February 10, 2018 11:57 AM) Echocardiogram  01/28/2018 1. Left ventricle cavity is normal in size. Normal global wall motion. Calculated EF 60%. 2. Left atrial cavity is slightly dilated. 3. Right atrial cavity is mildly dilated. 4. Mild (Grade I) perivalvular aortic regurgitation, may be due to fistula. Mild calcification of the aortic valve annulus. 5. Mild to moderate mitral regurgitation. Mild calcification of the mitral valve annulus. 6. Mild to moderate tricuspid regurgitation.  Mild pulmonary hypertension with approx. PA syst. pressure of 33 mm of Hg. 7. IVC is  dilated (suggests elevated RA pressure) with respiratory variation. 8. Suggest TEE to evaluate for fistula in view of perivalvular aortic regurgitation.    Review of Systems Suzanne Page MD; 02/03/2018 12:27 PM) General Not Present- Appetite Loss, Fatigue and Weight Gain. Respiratory Not Present- Chronic Cough and Wakes up from Sleep Wheezing or Short of Breath. Cardiovascular Not Present- Chest Pain, Difficulty Breathing Lying Down and Difficulty Breathing On Exertion. Gastrointestinal Not Present- Black, Tarry Stool and Difficulty Swallowing. Musculoskeletal Not Present- Decreased Range of Motion and Muscle Atrophy. Neurological Not Present- Attention Deficit. Psychiatric Not Present- Personality Changes and Suicidal Ideation. Endocrine Not Present- Cold Intolerance and Heat Intolerance. Hematology Not Present- Abnormal Bleeding. All other systems negative  Vitals Suzanne Hunt; 02/03/2018 11:14 AM) 02/03/2018 11:06 AM Weight: 130.31 lb Height: 63in Body Surface Area: 1.61 m Body Mass Index: 23.08 kg/m  Pulse: 53 (Regular)  P.OX: 98% (Room air) BP: 120/70 (Sitting, Left Arm, Standard) 118/66 in R arm  Physical Exam Suzanne Page MD; 02/03/2018 4:59 PM) General Mental Status-Alert. General Appearance-Cooperative and Appears stated age. Build & Nutrition-Well nourished and Moderately built.  Head and Neck Thyroid Gland Characteristics - normal size and consistency and no palpable nodules.  Chest and Lung Exam Chest and lung exam reveals -quiet, even and easy respiratory effort with no use of accessory muscles, non-tender and on auscultation, normal breath sounds, no adventitious sounds.  Cardiovascular Cardiovascular examination reveals -carotid auscultation reveals no bruits, abdominal aorta auscultation reveals no bruits and no prominent pulsation, femoral  artery auscultation bilaterally reveals normal pulses, no bruits, no thrills, normal pedal pulses bilaterally and no digital clubbing, cyanosis, edema, increased warmth or tenderness. Auscultation Rhythm - Regular. Heart Sounds - Normal heart sounds. Murmurs & Other Heart Sounds: Murmur - Location - Aortic Area and Apex. Timing - Early systolic. Grade - II/VI.  Abdomen Palpation/Percussion Normal exam - Non Tender and No hepatosplenomegaly.  Neurologic Neurologic evaluation reveals -alert and oriented x 3 with no impairment of recent or remote memory. Motor-Grossly intact without any focal deficits.  Musculoskeletal Global Assessment Left Lower Extremity - no deformities, masses or tenderness, no known fractures. Right Lower Extremity - no deformities, masses or tenderness, no known fractures.  Assessment & Plan Suzanne Page MD; 02/03/2018 5:00 PM) Coronary artery disease of native artery of native heart with stable angina pectoris (I25.118) Story: Coronary angiogram 11/25/2017: Mid LAD at rate S/P balloon angioplasty with 2 x 12 Saphire balloon, mild disease in other vessels, mild LV systolic dysfunction, EF 82%. Atrial fibrillation with controlled ventricular rate (I48.91) Story: EKG 02/03/2018: Marked sinus bradycardia at rate of 50 bpm, normal axis, anteroseptal infarct old. Diffuse nonspecific T-wave flattening. Low-voltage complexes.  EKG 12/18/2017: Coarse atrial fibrillation with controlled ventricular response at the rate of 77 bpm, normal axis, anterolateral infarct old with persistent deep T wave inversion in V1 to V6. Abnormal EKG. Current Plans Complete electrocardiogram (95621) METABOLIC PANEL, COMPREHENSIVE (30865) CBC & PLATELETS (AUTO) (78469) History of MI (myocardial infarction) (I25.2) Benign essential hypertension (I10) Story: Echo- 01/28/2018 1. Left ventricle cavity is normal in size. Normal global wall motion. Calculated EF 60%. 2. Left atrial cavity  is slightly dilated. 3. Right atrial cavity is mildly dilated. 4. Mild (Grade I) perivalvular aortic regurgitation, may be due to fistula. Mild calcification of the aortic valve annulus. 5. Mild to moderate mitral regurgitation. Mild calcification of the mitral valve annulus. 6. Mild to moderate tricuspid regurgitation. Mild pulmonary hypertension with approx. PA syst. pressure of 33 mm of  Hg. 7. IVC is dilated (suggests elevated RA pressure) with respiratory variation. 8. Suggest TEE to evaluate for fistula in view of perivalvular aortic regurgitation.  Compared to Echocardiogram 41/14/6431: LV systolic function moderately depressed at 35-40% with grade 3 diastolic dysfunction. Akinesis of the mid apical anterior, mid anterolateral and apical myocardium. Mild to moderate TR and moderate pulmonary hypertension, PA pressure 49 mmHg. Hypercholesteremia (E78.00) Current Plans LIPID PANEL (42767) Laboratory examination (W11.00) Story: 01/02/2018: Creatinine 1.23, EGFR 46/53, potassium 4.3, BMP otherwise normal.  Labs 12/09/2017: H/H 12/37. MCV 94. Platelets 179, Glucose 92. BUN/creatinine 22/1.24. EGFR 52. Sodium 138, potassium 4.1. Peak troponin 42.6. Hemoglobin A1c 4.9%. TSH 3.7 normal. Glucose of 178, triglycerides 34, HDL 58, LDL 113.  Note:. Recommendation:  Ms. Suzanne Hunt is an African-American female with hypertension, hyperlipidemia, she is a nonsmoker admitted to [facility] on 12/08/2017 anterior STEMI,S/P PCI to mid to distal LAD with balloon angioplasty. She developed coarse atrial fibrillation with controlled ventricular response and presently on Eliquis and Brilinta. She had Echocardiogram on 01/29/2018 presents for follow-up.   Essentially asymptomatic. Her ejection fraction is now improved to normal from 35%. She was scheduled for outpatient electrical direct current cardioversion but had converted to sinus rhythm spontaneously. She is currently on Brilinta and is  tolerating this well. She will need to continue this for at least one year. I suspect A. Fib may be related to ischemia and LV dysfunction. I probably could discontinue anticoagulation in 3-4 months if she continues to maintain sinus rhythm.  I reviewed with the patient. The echocardiogram which reveals what appears like a paravalvular aortic valve leak. I will like to perform TEE to better understand the etiology and pathology behind this. Very unusual aortic regurgitation.  From Cardiac standpoint she is remained stable, blood pressure is well controlled, she is on aggressive lipid management, I'll see her back after the TEE and make further recommendations. I will repeat labs.  CC: Dr. Donald Prose (PCP)    Signed by Suzanne Page, MD (02/03/2018 5:00 PM)

## 2018-02-11 ENCOUNTER — Encounter (HOSPITAL_COMMUNITY): Payer: Self-pay | Admitting: *Deleted

## 2018-02-11 ENCOUNTER — Encounter (HOSPITAL_COMMUNITY)
Admission: RE | Disposition: A | Discharge status: Home / Self Care | Payer: Self-pay | Source: Ambulatory Visit | Attending: Cardiology

## 2018-02-11 ENCOUNTER — Ambulatory Visit (HOSPITAL_COMMUNITY)
Admission: RE | Admit: 2018-02-11 | Discharge: 2018-02-11 | Disposition: A | Discharge status: Home / Self Care | Payer: BC Managed Care – PPO | Source: Ambulatory Visit | Attending: Cardiology | Admitting: Cardiology

## 2018-02-11 ENCOUNTER — Other Ambulatory Visit: Payer: Self-pay

## 2018-02-11 ENCOUNTER — Ambulatory Visit (HOSPITAL_COMMUNITY): Admission: RE | Admit: 2018-02-11 | Payer: BC Managed Care – PPO | Source: Ambulatory Visit | Admitting: Cardiology

## 2018-02-11 DIAGNOSIS — I272 Pulmonary hypertension, unspecified: Secondary | ICD-10-CM | POA: Insufficient documentation

## 2018-02-11 DIAGNOSIS — Z7902 Long term (current) use of antithrombotics/antiplatelets: Secondary | ICD-10-CM | POA: Diagnosis not present

## 2018-02-11 DIAGNOSIS — E785 Hyperlipidemia, unspecified: Secondary | ICD-10-CM | POA: Diagnosis not present

## 2018-02-11 DIAGNOSIS — I252 Old myocardial infarction: Secondary | ICD-10-CM | POA: Insufficient documentation

## 2018-02-11 DIAGNOSIS — I10 Essential (primary) hypertension: Secondary | ICD-10-CM | POA: Diagnosis not present

## 2018-02-11 DIAGNOSIS — I4891 Unspecified atrial fibrillation: Secondary | ICD-10-CM | POA: Insufficient documentation

## 2018-02-11 DIAGNOSIS — Z7901 Long term (current) use of anticoagulants: Secondary | ICD-10-CM | POA: Insufficient documentation

## 2018-02-11 DIAGNOSIS — E7800 Pure hypercholesterolemia, unspecified: Secondary | ICD-10-CM | POA: Insufficient documentation

## 2018-02-11 DIAGNOSIS — I25118 Atherosclerotic heart disease of native coronary artery with other forms of angina pectoris: Secondary | ICD-10-CM | POA: Insufficient documentation

## 2018-02-11 DIAGNOSIS — I351 Nonrheumatic aortic (valve) insufficiency: Secondary | ICD-10-CM | POA: Diagnosis not present

## 2018-02-11 SURGERY — INVASIVE LAB ABORTED CASE

## 2018-02-11 MED ORDER — FENTANYL CITRATE (PF) 100 MCG/2ML IJ SOLN
INTRAMUSCULAR | Status: DC | PRN
  Administered 2018-02-11 (×4): 25 ug via INTRAVENOUS

## 2018-02-11 MED ORDER — MIDAZOLAM HCL 10 MG/2ML IJ SOLN
INTRAMUSCULAR | Status: DC | PRN
  Administered 2018-02-11 (×4): 1 mg via INTRAVENOUS

## 2018-02-11 MED ORDER — SODIUM CHLORIDE 0.9 % IV SOLN
INTRAVENOUS | Status: DC
  Administered 2018-02-11: 13:00:00 via INTRAVENOUS

## 2018-02-11 MED ORDER — FENTANYL CITRATE (PF) 100 MCG/2ML IJ SOLN
INTRAMUSCULAR | Status: AC
  Filled 2018-02-11: qty 2

## 2018-02-11 MED ORDER — MIDAZOLAM HCL 5 MG/ML IJ SOLN
INTRAMUSCULAR | Status: AC
  Filled 2018-02-11: qty 2

## 2018-02-11 NOTE — Op Note (Signed)
Conscious sedation protocol was followed, I personally administered conscious sedation and monitored the patient. Patient received 4 milligrams of Versed and 100 mcg .  In spite of multiple attempts, I was not able to pass the probe. Patient had a very strong gag reflex in spite of topical anesthesia. Given patient's repeated attempts of sitting up trying to pull the probe out, risk of local injury outweighed benefits of further attempts.   Patient tolerated the procedure well and there was no complication from conscious sedation. Time administered was 20 min and procedure ended at 1:50 PM.  No cardiac images were obtained.  Elder Negus, MD [facility] Pager: [phone] Office: [phone] If no answer Cell [phone]

## 2018-02-11 NOTE — Discharge Instructions (Signed)

## 2018-02-11 NOTE — Interval H&P Note (Signed)
History and Physical Interval Note:  02/11/2018 1:23 PM  Suzanne Hunt  has presented today for surgery, with the diagnosis of AORTIC REGURGITATION  The various methods of treatment have been discussed with the patient and family. After consideration of risks, benefits and other options for treatment, the patient has consented to  Procedure(s): TRANSESOPHAGEAL ECHOCARDIOGRAM (TEE) (N/A) as a surgical intervention .  The patient's history has been reviewed, patient examined, no change in status, stable for surgery.  I have reviewed the patient's chart and labs.  Questions were answered to the patient's satisfaction.     [name] J Klyn Kroening

## 2018-03-24 NOTE — H&P (Signed)
OFFICE VISIT NOTES COPIED TO EPIC FOR DOCUMENTATION  . History of Present Illness [name] MD; 02/10/2018 4:58 PM) Patient words: f/u echo; Last OV 12/18/2017.  The patient is a 67 year old female who presents for a Follow-up for Coronary artery disease. Ms. Suzanne Hunt is an African-American female with hypertension, hyperlipidemia, she is a nonsmoker admitted to [facility] on 12/08/2017 anterior STEMI,S/P PCI to mid to distal LAD with balloon angioplasty. She developed coarse atrial fibrillation with controlled ventricular response and presently on Eliquis and Brilinta. She had Echocardiogram on 01/29/2018 presents for follow-up.   Essentially asymptomatic. She was set up for outpatient direct current cardioversion 3 weeks ago, but she was spontaneously in sinus rhythm. She states that she will not be able to do cardiac rehab due to her work schedule.   Problem List/Past Medical Suzanne Hunt; 02/10/2018 11:04 AM) Atrial fibrillation (I48.91)  Laboratory examination (Z01.89)  01/02/2018: Creatinine 1.23, EGFR 46/53, potassium 4.3, BMP otherwise normal. Labs 12/09/2017: H/H 12/37. MCV 94. Platelets 179, Glucose 92. BUN/creatinine 22/1.24. EGFR 52. Sodium 138, potassium 4.1. Peak troponin 42.6. Hemoglobin A1c 4.9%. TSH 3.7 normal. Glucose of 178, triglycerides 34, HDL 58, LDL 113. Coronary artery disease of native artery of native heart with stable angina pectoris (I25.118)  Coronary angiogram 11/25/2017: Mid LAD at rate S/P balloon angioplasty with 2 x 12 Saphire balloon, mild disease in other vessels, mild LV systolic dysfunction, EF 94%. Benign essential hypertension (I10)  Echocardiogram 70/96/2836: LV systolic function moderately depressed at 35-40% with grade 3 diastolic dysfunction. Akinesis of the mid apical anterior, mid anterolateral and apical myocardium. Mild to moderate TR and moderate pulmonary hypertension, PA pressure 49 mmHg. Hypercholesteremia (E78.00)   History of MI (myocardial infarction) (I25.2) [11/2017]:  Allergies Suzanne Hunt; 02/10/18 11:04 AM) Actonel *ENDOCRINE AND METABOLIC AGENTS - MISC.*  Flu like symptoms  Family History Suzanne Hunt; 02-10-18 11:04 AM) Mother  Deceased. at age 38 from CHF; Hx of HTN, CVA Father  Deceased. at age 50 from DM; No Known Heart conditions Sister 4  2-Older; 2-Younger; No Known Heart conditions Brother 3  1-Older; 2-Younger; No Known Heart conditions  Social History Suzanne Hunt; 2018-02-10 11:04 AM) Current tobacco use  Never smoker. Non Drinker/No Alcohol Use  Marital status  Married. Number of Children  0. Living Situation  Lives with spouse.  Past Surgical History Suzanne Hunt; February 10, 2018 11:04 AM) None [12/18/2017]:  Medication History Suzanne Hunt; 02/10/2018 11:11 AM) Boniva (150MG Tablet, 1 Oral monthly) Active. Brilinta (90MG Tablet, 1 Oral two times daily) Active. Eliquis (5MG Tablet, 1 Oral two times daily) Active. Esomeprazole Magnesium (40MG Capsule DR, 1 Oral as needed) Active. Losartan Potassium (25MG Tablet, 1 Oral daily) Active. Rosuvastatin Calcium (20MG Tablet, 1 Oral daily) Active. Metoprolol Tartrate (25MG Tablet, 1/2 Oral two times daily) Active. Nitroglycerin (0.4MG Tab Sublingual, 1 Sublingual every 5 minutes as needed for chest pain.) Active. Medications Reconciled (Pt brought medications)  Diagnostic Studies History [name], MD; February 10, 2018 11:57 AM) Echocardiogram  01/28/2018 1. Left ventricle cavity is normal in size. Normal global wall motion. Calculated EF 60%. 2. Left atrial cavity is slightly dilated. 3. Right atrial cavity is mildly dilated. 4. Mild (Grade I) perivalvular aortic regurgitation, may be due to fistula. Mild calcification of the aortic valve annulus. 5. Mild to moderate mitral regurgitation. Mild calcification of the mitral valve annulus. 6. Mild to moderate tricuspid regurgitation.  Mild pulmonary hypertension with approx. PA syst. pressure of 33 mm of Hg. 7. IVC is  dilated (suggests elevated RA pressure) with respiratory variation. 8. Suggest TEE to evaluate for fistula in view of perivalvular aortic regurgitation.    Review of Systems [name] MD; 02/03/2018 12:27 PM) General Not Present- Appetite Loss, Fatigue and Weight Gain. Respiratory Not Present- Chronic Cough and Wakes up from Sleep Wheezing or Short of Breath. Cardiovascular Not Present- Chest Pain, Difficulty Breathing Lying Down and Difficulty Breathing On Exertion. Gastrointestinal Not Present- Black, Tarry Stool and Difficulty Swallowing. Musculoskeletal Not Present- Decreased Range of Motion and Muscle Atrophy. Neurological Not Present- Attention Deficit. Psychiatric Not Present- Personality Changes and Suicidal Ideation. Endocrine Not Present- Cold Intolerance and Heat Intolerance. Hematology Not Present- Abnormal Bleeding. All other systems negative  Vitals Suzanne Hunt; 02/03/2018 11:14 AM) 02/03/2018 11:06 AM Weight: 130.31 lb Height: 63in Body Surface Area: 1.61 m Body Mass Index: 23.08 kg/m  Pulse: 53 (Regular)  P.OX: 98% (Room air) BP: 120/70 (Sitting, Left Arm, Standard) 118/66 in R arm Physical Exam [name] MD; 02/03/2018 4:59 PM) General Mental Status-Alert. General Appearance-Cooperative and Appears stated age. Build & Nutrition-Well nourished and Moderately built.  Head and Neck Thyroid Gland Characteristics - normal size and consistency and no palpable nodules.  Chest and Lung Exam Chest and lung exam reveals -quiet, even and easy respiratory effort with no use of accessory muscles, non-tender and on auscultation, normal breath sounds, no adventitious sounds.  Cardiovascular Cardiovascular examination reveals -carotid auscultation reveals no bruits, abdominal aorta auscultation reveals no bruits and no prominent pulsation, femoral  artery auscultation bilaterally reveals normal pulses, no bruits, no thrills, normal pedal pulses bilaterally and no digital clubbing, cyanosis, edema, increased warmth or tenderness. Auscultation Rhythm - Regular. Heart Sounds - Normal heart sounds. Murmurs & Other Heart Sounds: Murmur - Location - Aortic Area and Apex. Timing - Early systolic. Grade - II/VI.  Abdomen Palpation/Percussion Normal exam - Non Tender and No hepatosplenomegaly.  Neurologic Neurologic evaluation reveals -alert and oriented x 3 with no impairment of recent or remote memory. Motor-Grossly intact without any focal deficits.  Musculoskeletal Global Assessment Left Lower Extremity - no deformities, masses or tenderness, no known fractures. Right Lower Extremity - no deformities, masses or tenderness, no known fractures.    Assessment & Plan [name] MD; 02/03/2018 5:00 PM) Coronary artery disease of native artery of native heart with stable angina pectoris (I25.118) Story: Coronary angiogram 11/25/2017: Mid LAD at rate S/P balloon angioplasty with 2 x 12 Saphire balloon, mild disease in other vessels, mild LV systolic dysfunction, EF 78%. Atrial fibrillation with controlled ventricular rate (I48.91) Story: EKG 02/03/2018: Marked sinus bradycardia at rate of 50 bpm, normal axis, anteroseptal infarct old. Diffuse nonspecific T-wave flattening. Low-voltage complexes.  EKG 12/18/2017: Coarse atrial fibrillation with controlled ventricular response at the rate of 77 bpm, normal axis, anterolateral infarct old with persistent deep T wave inversion in V1 to V6. Abnormal EKG. Current Plans Complete electrocardiogram (58850) METABOLIC PANEL, COMPREHENSIVE (27741) CBC & PLATELETS (AUTO) (28786) History of MI (myocardial infarction) (I25.2) Benign essential hypertension (I10) Story: Echo- 01/28/2018 1. Left ventricle cavity is normal in size. Normal global wall motion. Calculated EF 60%. 2. Left atrial  cavity is slightly dilated. 3. Right atrial cavity is mildly dilated. 4. Mild (Grade I) perivalvular aortic regurgitation, may be due to fistula. Mild calcification of the aortic valve annulus. 5. Mild to moderate mitral regurgitation. Mild calcification of the mitral valve annulus. 6. Mild to moderate tricuspid regurgitation. Mild pulmonary hypertension with approx. PA syst. pressure of 33 mm  of Hg. 7. IVC is dilated (suggests elevated RA pressure) with respiratory variation. 8. Suggest TEE to evaluate for fistula in view of perivalvular aortic regurgitation.  Compared to Echocardiogram 62/06/5595: LV systolic function moderately depressed at 35-40% with grade 3 diastolic dysfunction. Akinesis of the mid apical anterior, mid anterolateral and apical myocardium. Mild to moderate TR and moderate pulmonary hypertension, PA pressure 49 mmHg. Hypercholesteremia (E78.00) Current Plans LIPID PANEL (41638) Laboratory examination (G53.64) Story: 01/02/2018: Creatinine 1.23, EGFR 46/53, potassium 4.3, BMP otherwise normal.  Labs 12/09/2017: H/H 12/37. MCV 94. Platelets 179, Glucose 92. BUN/creatinine 22/1.24. EGFR 52. Sodium 138, potassium 4.1. Peak troponin 42.6. Hemoglobin A1c 4.9%. TSH 3.7 normal. Glucose of 178, triglycerides 34, HDL 58, LDL 113.  Recommendation:  Ms. Suzanne Hunt is an African-American female with hypertension, hyperlipidemia, she is a nonsmoker admitted to [facility] on 12/08/2017 anterior STEMI,S/P PCI to mid to distal LAD with balloon angioplasty. She developed coarse atrial fibrillation with controlled ventricular response and presently on Eliquis and Brilinta. She had Echocardiogram on 01/29/2018 presents for follow-up.   Essentially asymptomatic. Her ejection fraction is now improved to normal from 35%. She was scheduled for outpatient electrical direct current cardioversion but had converted to sinus rhythm spontaneously. She is currently on Brilinta and is  tolerating this well. She will need to continue this for at least one year. I suspect A. Fib may be related to ischemia and LV dysfunction. I probably could discontinue anticoagulation in 3-4 months if she continues to maintain sinus rhythm.  I reviewed with the patient. The echocardiogram which reveals what appears like a paravalvular aortic valve leak. I will like to perform TEE to better understand the etiology and pathology behind this. Very unusual aortic regurgitation.  From Cardiac standpoint she is remained stable, blood pressure is well controlled, she is on aggressive lipid management, I'll see her back after the TEE and make further recommendations. I will repeat labs.  CC: Dr. Donald Prose (PCP)    Signed by [name], MD (02/03/2018 5:00 PM)

## 2018-03-25 ENCOUNTER — Encounter (HOSPITAL_COMMUNITY)
Admission: RE | Disposition: A | Discharge status: Home / Self Care | Payer: Self-pay | Source: Home / Self Care | Attending: Cardiology

## 2018-03-25 ENCOUNTER — Ambulatory Visit (HOSPITAL_COMMUNITY): Payer: BC Managed Care – PPO | Admitting: Certified Registered"

## 2018-03-25 ENCOUNTER — Ambulatory Visit (HOSPITAL_COMMUNITY)
Admission: RE | Admit: 2018-03-25 | Discharge: 2018-03-25 | Disposition: A | Discharge status: Home / Self Care | Payer: BC Managed Care – PPO | Source: Ambulatory Visit | Attending: Cardiology | Admitting: Cardiology

## 2018-03-25 ENCOUNTER — Ambulatory Visit (HOSPITAL_COMMUNITY)
Admission: RE | Admit: 2018-03-25 | Discharge: 2018-03-25 | Disposition: A | Discharge status: Home / Self Care | Payer: BC Managed Care – PPO | Attending: Cardiology | Admitting: Cardiology

## 2018-03-25 ENCOUNTER — Encounter (HOSPITAL_COMMUNITY): Payer: Self-pay | Admitting: Certified Registered"

## 2018-03-25 DIAGNOSIS — I10 Essential (primary) hypertension: Secondary | ICD-10-CM | POA: Diagnosis not present

## 2018-03-25 DIAGNOSIS — Z955 Presence of coronary angioplasty implant and graft: Secondary | ICD-10-CM | POA: Insufficient documentation

## 2018-03-25 DIAGNOSIS — Z79899 Other long term (current) drug therapy: Secondary | ICD-10-CM | POA: Insufficient documentation

## 2018-03-25 DIAGNOSIS — I351 Nonrheumatic aortic (valve) insufficiency: Secondary | ICD-10-CM | POA: Diagnosis not present

## 2018-03-25 DIAGNOSIS — Z823 Family history of stroke: Secondary | ICD-10-CM | POA: Insufficient documentation

## 2018-03-25 DIAGNOSIS — Z8249 Family history of ischemic heart disease and other diseases of the circulatory system: Secondary | ICD-10-CM | POA: Diagnosis not present

## 2018-03-25 DIAGNOSIS — I252 Old myocardial infarction: Secondary | ICD-10-CM | POA: Diagnosis not present

## 2018-03-25 DIAGNOSIS — Z7901 Long term (current) use of anticoagulants: Secondary | ICD-10-CM | POA: Diagnosis not present

## 2018-03-25 DIAGNOSIS — I4891 Unspecified atrial fibrillation: Secondary | ICD-10-CM | POA: Insufficient documentation

## 2018-03-25 DIAGNOSIS — E7800 Pure hypercholesterolemia, unspecified: Secondary | ICD-10-CM | POA: Insufficient documentation

## 2018-03-25 DIAGNOSIS — I079 Rheumatic tricuspid valve disease, unspecified: Secondary | ICD-10-CM | POA: Diagnosis not present

## 2018-03-25 DIAGNOSIS — Z888 Allergy status to other drugs, medicaments and biological substances status: Secondary | ICD-10-CM | POA: Diagnosis not present

## 2018-03-25 DIAGNOSIS — I2510 Atherosclerotic heart disease of native coronary artery without angina pectoris: Secondary | ICD-10-CM | POA: Insufficient documentation

## 2018-03-25 HISTORY — PX: TEE WITHOUT CARDIOVERSION: SHX5443

## 2018-03-25 SURGERY — ECHOCARDIOGRAM, TRANSESOPHAGEAL
Anesthesia: Monitor Anesthesia Care

## 2018-03-25 MED ORDER — PROPOFOL 500 MG/50ML IV EMUL
INTRAVENOUS | Status: DC | PRN
  Administered 2018-03-25: 150 ug/kg/min via INTRAVENOUS

## 2018-03-25 MED ORDER — PROPOFOL 10 MG/ML IV BOLUS
INTRAVENOUS | Status: DC | PRN
  Administered 2018-03-25: 20 mg via INTRAVENOUS

## 2018-03-25 MED ORDER — SODIUM CHLORIDE 0.9 % IV SOLN
INTRAVENOUS | Status: DC
  Administered 2018-03-25: 14:00:00 via INTRAVENOUS

## 2018-03-25 MED ORDER — EPHEDRINE SULFATE 50 MG/ML IJ SOLN
INTRAMUSCULAR | Status: DC | PRN
  Administered 2018-03-25: 5 mg via INTRAVENOUS

## 2018-03-25 MED ORDER — LIDOCAINE HCL (CARDIAC) PF 100 MG/5ML IV SOSY
PREFILLED_SYRINGE | INTRAVENOUS | Status: DC | PRN
  Administered 2018-03-25: 50 mg via INTRAVENOUS

## 2018-03-25 NOTE — CV Procedure (Signed)
TEE: Under deep sedation with Diprivan and anesthesia help, TEE was performed without complications: LV: Normal. Normal EF. RV: Normal LA: Normal. Left atrial appendage: Normal without thrombus. Normal function. Inter atrial septum is intact without defect. Double contrast study negative for atrial level shunting. No late appearance of bubbles either. RA: Normal  MV: Normal Trace MR. TV: Normal Trace TR AV: Tricuspid. There is mal coaption of the non and left coronary cusp with resultant leak at the base of the cusp and mild AI. PV: Normal. Trace PI.  Thoracic and ascending aorta: Minimal calcific plaque.   Sedation will be charged by Anesthesia.

## 2018-03-25 NOTE — Transfer of Care (Signed)
Immediate Anesthesia Transfer of Care Note  Patient: Suzanne Hunt  Procedure(s) Performed: TRANSESOPHAGEAL ECHOCARDIOGRAM (TEE) (N/A )  Patient Location: Endoscopy Unit  Anesthesia Type:MAC  Level of Consciousness: awake, drowsy and patient cooperative  Airway & Oxygen Therapy: Patient Spontanous Breathing  Post-op Assessment: Report given to RN and Post -op Vital signs reviewed and stable  Post vital signs: Reviewed and stable  Last Vitals:  Vitals Value Taken Time  BP    Temp    Pulse    Resp    SpO2      Last Pain:  Vitals:   03/25/18 1153  TempSrc: Oral  PainSc: 0-No pain         Complications: No apparent anesthesia complications

## 2018-03-25 NOTE — Interval H&P Note (Signed)
History and Physical Interval Note:  03/25/2018 2:05 PM  Suzanne NormanJanice M Lanting  has presented today for surgery, with the diagnosis of AORTIC REGURGITATION  The various methods of treatment have been discussed with the patient and family. After consideration of risks, benefits and other options for treatment, the patient has consented to  Procedure(s): TRANSESOPHAGEAL ECHOCARDIOGRAM (TEE) (N/A) as a surgical intervention .  The patient's history has been reviewed, patient examined, no change in status, stable for surgery.  I have reviewed the patient's chart and labs.  Questions were answered to the patient's satisfaction.     Yates DecampJay Remus Hagedorn

## 2018-03-25 NOTE — Anesthesia Preprocedure Evaluation (Signed)
Anesthesia Evaluation  Patient identified by MRN, date of birth, ID band Patient awake    Reviewed: Allergy & Precautions, NPO status , Patient's Chart, lab work & pertinent test results  Airway Mallampati: II  TM Distance: >3 FB Neck ROM: Full    Dental no notable dental hx.    Pulmonary neg pulmonary ROS,    Pulmonary exam normal breath sounds clear to auscultation       Cardiovascular hypertension, + CAD and + Past MI  Normal cardiovascular exam+ Valvular Problems/Murmurs MR and AI  Rhythm:Regular Rate:Normal     Neuro/Psych negative neurological ROS  negative psych ROS   GI/Hepatic negative GI ROS, Neg liver ROS,   Endo/Other  negative endocrine ROS  Renal/GU negative Renal ROS  negative genitourinary   Musculoskeletal negative musculoskeletal ROS (+)   Abdominal   Peds negative pediatric ROS (+)  Hematology negative hematology ROS (+)   Anesthesia Other Findings   Reproductive/Obstetrics negative OB ROS                             Anesthesia Physical Anesthesia Plan  ASA: III  Anesthesia Plan: MAC   Post-op Pain Management:    Induction: Intravenous  PONV Risk Score and Plan: 0  Airway Management Planned: Simple Face Mask  Additional Equipment:   Intra-op Plan:   Post-operative Plan:   Informed Consent: I have reviewed the patients History and Physical, chart, labs and discussed the procedure including the risks, benefits and alternatives for the proposed anesthesia with the patient or authorized representative who has indicated his/her understanding and acceptance.   Dental advisory given  Plan Discussed with: CRNA and Surgeon  Anesthesia Plan Comments:         Anesthesia Quick Evaluation

## 2018-03-25 NOTE — Anesthesia Postprocedure Evaluation (Signed)
Anesthesia Post Note  Patient: Suzanne Hunt  Procedure(s) Performed: TRANSESOPHAGEAL ECHOCARDIOGRAM (TEE) (N/A )     Patient location during evaluation: PACU Anesthesia Type: MAC Level of consciousness: awake and alert Pain management: pain level controlled Vital Signs Assessment: post-procedure vital signs reviewed and stable Respiratory status: spontaneous breathing, nonlabored ventilation and respiratory function stable Cardiovascular status: blood pressure returned to baseline and stable Postop Assessment: no apparent nausea or vomiting Anesthetic complications: no    Last Vitals:  Vitals:   03/25/18 1445 03/25/18 1456  BP: (!) 124/55 (!) 126/59  Pulse: (!) 57 60  Resp: 20 15  Temp:    SpO2: 100% 100%    Last Pain:  Vitals:   03/25/18 1456  TempSrc:   PainSc: 0-No pain                 Lidia Collum

## 2018-03-25 NOTE — Anesthesia Procedure Notes (Signed)
Procedure Name: MAC Date/Time: 03/25/2018 2:08 PM Performed by: Oletta Lamas, CRNA Pre-anesthesia Checklist: Patient identified, Emergency Drugs available, Suction available and Patient being monitored Patient Re-evaluated:Patient Re-evaluated prior to induction Oxygen Delivery Method: Simple face mask

## 2018-03-25 NOTE — Discharge Instructions (Signed)

## 2018-03-25 NOTE — Progress Notes (Signed)
  Echocardiogram Echocardiogram Transesophageal has been performed.  Suzanne Hunt, Suzanne Hunt M 03/25/2018, 2:47 PM

## 2018-04-14 ENCOUNTER — Ambulatory Visit
Admission: RE | Admit: 2018-04-14 | Discharge: 2018-04-14 | Disposition: A | Discharge status: Home / Self Care | Payer: BC Managed Care – PPO | Source: Ambulatory Visit | Attending: Family Medicine | Admitting: Family Medicine

## 2018-04-14 DIAGNOSIS — M810 Age-related osteoporosis without current pathological fracture: Secondary | ICD-10-CM

## 2018-07-07 ENCOUNTER — Other Ambulatory Visit: Payer: Self-pay

## 2018-07-07 MED ORDER — ROSUVASTATIN CALCIUM 20 MG PO TABS: 20.0000 mg | ORAL_TABLET | Freq: Every day | ORAL | 2 refills | Status: DC

## 2018-07-07 MED ORDER — METOPROLOL TARTRATE 25 MG PO TABS: 12.5000 mg | ORAL_TABLET | Freq: Two times a day (BID) | ORAL | 2 refills | Status: DC

## 2018-07-07 MED ORDER — LOSARTAN POTASSIUM 25 MG PO TABS: 25.0000 mg | ORAL_TABLET | Freq: Every evening | ORAL | 1 refills | Status: DC

## 2018-07-07 MED ORDER — TICAGRELOR 90 MG PO TABS: 90.0000 mg | ORAL_TABLET | Freq: Two times a day (BID) | ORAL | 6 refills | Status: DC

## 2018-10-02 ENCOUNTER — Ambulatory Visit: Payer: Self-pay | Admitting: Cardiology

## 2018-10-02 NOTE — Progress Notes (Deleted)
Primary Physician:  Deatra JamesSun, Vyvyan, MD   Patient ID: Suzanne NormanJanice M Jensen, female    DOB: 05/22/1950, 68 y.o.   MRN: [id number]  Subjective:    No chief complaint on file.   HPI: Suzanne Hunt  is a 68 y.o. female  with hypertension, hyperlipidemia, she is a nonsmoker admitted [facility] on 12/08/2017 anterior STEMI,S/P PCI to mid to distal LAD with balloon angioplasty. She developed coarse atrial fibrillation with controlled ventricular response and presently on Eliquis and Brilinta.  Due to abnormal echocardiogram revealing very eccentric aortic regurgitation, suspicion for either a fistula or a paravalvular leak, she underwent TEE on 03/25/2018 essentially revealing a very small eccentric aortic regurgitation due to incomplete leaflet coaption. She complains of sharp chest pains occasionally will she is exerting and exercising. She is also noticed mild heartburn since being on Brilinta. No dark stools or bloody stools. No dyspnea, dizziness or syncope. Denies palpitations.  Past Medical History:  Diagnosis Date  . Coronary artery disease   . Hypertension   . Medical history non-contributory   . Myocardial infarction [facility](HCC)     Past Surgical History:  Procedure Laterality Date  . CORONARY/GRAFT ACUTE MI REVASCULARIZATION N/A 12/08/2017   Procedure: Coronary/Graft Acute MI Revascularization;  Surgeon: Yates DecampGanji, Jay, MD;  [facility];  Service: Cardiovascular;  Laterality: N/A;  . LEFT HEART CATH AND CORONARY ANGIOGRAPHY N/A 12/08/2017   Procedure: LEFT HEART CATH AND CORONARY ANGIOGRAPHY;  Surgeon: Yates DecampGanji, Jay, MD;  Location: [facility] CV LAB;  Service: Cardiovascular;  Laterality: N/A;  . TEE WITHOUT CARDIOVERSION N/A 03/25/2018   Procedure: TRANSESOPHAGEAL ECHOCARDIOGRAM (TEE);  Surgeon: Yates DecampGanji, Jay, MD;  [facility];  Service: Cardiovascular;  Laterality: N/A;    Social History   Socioeconomic History  . Marital status: Married   Spouse name: Bess HarvestMelvin Bozzi  . Number of children: 1  . Years of education: Not on file  . Highest education level: Not on file  Occupational History  . Not on file  Social Needs  . Financial resource strain: Not hard at all  . Food insecurity    Worry: Never true    Inability: Never true  . Transportation needs    Medical: No    Non-medical: No  Tobacco Use  . Smoking status: Never Smoker  . Smokeless tobacco: Never Used  Substance and Sexual Activity  . Alcohol use: No  . Drug use: No  . Sexual activity: Yes    Birth control/protection: None  Lifestyle  . Physical activity    Days per week: 5 days    Minutes per session: 60 min  . Stress: Only a little  Relationships  . Social connections    Talks on phone: More than three times a week    Gets together: More than three times a week    Attends religious service: More than 4 times per year    Active member of club or organization: Yes    Attends meetings of clubs or organizations: 1 to 4 times per year    Relationship status: Married  . Intimate partner violence    Fear of current or ex partner: No    Emotionally abused: No    Physically abused: No    Forced sexual activity: No  Other Topics Concern  . Not on file  Social History Narrative  . Not on file    Review of Systems  Constitution: Negative for decreased appetite, malaise/fatigue, weight gain and weight loss.  Eyes: Negative for visual disturbance.  Cardiovascular: Negative for chest pain, claudication, dyspnea on exertion, leg swelling, orthopnea, palpitations and syncope.  Respiratory: Negative for hemoptysis and wheezing.   Endocrine: Negative for cold intolerance and heat intolerance.  Hematologic/Lymphatic: Does not bruise/bleed easily.  Skin: Negative for nail changes.  Musculoskeletal: Negative for muscle weakness and myalgias.  Gastrointestinal: Positive for heartburn. Negative for abdominal pain, change in bowel habit, nausea and vomiting.   Neurological: Negative for difficulty with concentration, dizziness, focal weakness and headaches.  Psychiatric/Behavioral: Negative for altered mental status and suicidal ideas.  All other systems reviewed and are negative.     Objective:  There were no vitals taken for this visit. There is no height or weight on file to calculate BMI.    Physical Exam  Constitutional: She is oriented to person, place, and time. Vital signs are normal. She appears well-developed and well-nourished.  HENT:  Head: Normocephalic and atraumatic.  Neck: Normal range of motion.  Cardiovascular: Normal rate, regular rhythm and intact distal pulses.  Murmur heard.  Early systolic murmur is present with a grade of 2/6 at the upper right sternal border and apex. Pulmonary/Chest: Effort normal and breath sounds normal. No accessory muscle usage. No respiratory distress.  Abdominal: Soft. Bowel sounds are normal.  Musculoskeletal: Normal range of motion.  Neurological: She is alert and oriented to person, place, and time.  Skin: Skin is warm and dry.  Vitals reviewed.  Radiology: No results found.  Laboratory examination:   *** CMP Latest Ref Rng & Units 12/09/2017 12/09/2017 12/08/2017  Glucose 70 - 99 mg/dL 92 117(H) 128(H)  BUN 8 - 23 mg/dL 22 17 21   Creatinine 0.44 - 1.00 mg/dL 1.24(H) 0.88 1.05(H)  Sodium 135 - 145 mmol/L 138 135 139  Potassium 3.5 - 5.1 mmol/L 4.1 4.2 4.4  Chloride 98 - 111 mmol/L 108 110 107  CO2 22 - 32 mmol/L 20(L) 21(L) 24  Calcium 8.9 - 10.3 mg/dL 8.7(L) 8.1(L) 8.5(L)  Total Protein 6.5 - 8.1 g/dL - - 6.4(L)  Total Bilirubin 0.3 - 1.2 mg/dL - - 0.6  Alkaline Phos 38 - 126 U/L - - 85  AST 15 - 41 U/L - - 44(H)  ALT 0 - 44 U/L - - 26   CBC Latest Ref Rng & Units 12/09/2017 12/08/2017 12/08/2017  WBC 4.0 - 10.5 K/uL 5.8 5.9 -  Hemoglobin 12.0 - 15.0 g/dL 11.9(L) 12.3 13.3  Hematocrit 36.0 - 46.0 % 37.3 38.4 39.0  Platelets 150 - 400 K/uL 179 185 -   Lipid Panel      Component Value Date/Time   CHOL 178 12/09/2017 0616   TRIG 34 12/09/2017 0616   HDL 58 12/09/2017 0616   CHOLHDL 3.1 12/09/2017 0616   VLDL 7 12/09/2017 0616   LDLCALC 113 (H) 12/09/2017 0616   HEMOGLOBIN A1C Lab Results  Component Value Date   HGBA1C 4.9 12/08/2017   MPG 93.93 12/08/2017   TSH Recent Labs    12/09/17 0616  TSH 3.796    PRN Meds:. There are no discontinued medications. No outpatient medications have been marked as taking for the 10/02/18 encounter (Appointment) with Miquel Dunn, NP.    Cardiac Studies:   TEE 03/25/2018: Normal LV systolic function, trileaflet aortic valve. There is mild coaptation of the non-and left coronary cusp at the base leading to eccentric mild aortic valve regurgitation. No vegetation. Otherwise no significant abnormalities identified. Compared to the TTE 12/09/2017, EF is improved from 35-40%.  Echo-  01/28/2018 1. Left ventricle cavity is normal in size. Normal global wall motion. Calculated EF 60%. 2. Left atrial cavity is slightly dilated. 3. Right atrial cavity is mildly dilated. 4. Mild (Grade I) perivalvular aortic regurgitation, may be due to fistula. Mild calcification of the aortic valve annulus. 5. Mild to moderate mitral regurgitation. Mild calcification of the mitral valve annulus. 6. Mild to moderate tricuspid regurgitation. Mild pulmonary hypertension with approx. PA syst. pressure of 33 mm of Hg. 7. IVC is dilated (suggests elevated RA pressure) with respiratory variation. 8. Suggest TEE to evaluate for fistula in view of perivalvular aortic regurgitatio   Coronary angiogram 11/25/2017: Mid LAD at rate S/P balloon angioplasty with 2 x 12 [place] balloon, mild disease in other vessels, mild LV systolic dysfunction, EF 45%.  Assessment:   No diagnosis found.  EKG 02/03/2018: Marked sinus bradycardia at rate of 50 bpm, normal axis, anteroseptal infarct old. Diffuse nonspecific T-wave flattening.  Low-voltage complexes.  Recommendations:   Ms. Ave FilterJanice Bowden is an African-American female with hypertension, hyperlipidemia, she is a nonsmoker [facility] on 12/08/2017 anterior STEMI,S/P PCI to mid to distal LAD with balloon angioplasty. She developed coarse atrial fibrillation with controlled ventricular response and presently on Eliquis and Brilinta. She had Echocardiogram on 01/29/2018 which had revealed what appears like a paravalvular leak. She underwent TEE on 03/25/2018 essentially revealing a very small eccentric aortic regurgitation due to incomplete leaflet coaption.  I have reviewed the results extensively with the patient, also due to chest pain which are clearly muscular skeletal I have again discussed with her regarding the signs and symptoms of angina pectoris and also have low threshold for using sublingual nitroglycerin. She is now tolerating all her medications well, lipids are also well controlled.  She hasn't had any recurrence of atrial fibrillation, today although I did not do EKG she has got a regular rhythm, I will discontinue Eliquis and sutured to aspirin alone along with Brilinta. Since being on the medications, she has had some heartburn, she has Nexium at home which she will take on a p.r.n. basis. I will see her back in 6 months.  Toniann FailAshton Haynes Kelley, MSN, APRN, FNP-C [facility] [place] Office: [phone] Fax: [phone]

## 2018-10-03 ENCOUNTER — Other Ambulatory Visit: Payer: Self-pay | Admitting: Family Medicine

## 2018-10-03 DIAGNOSIS — Z1231 Encounter for screening mammogram for malignant neoplasm of breast: Secondary | ICD-10-CM

## 2018-10-06 ENCOUNTER — Encounter: Payer: Self-pay | Admitting: Cardiology

## 2018-10-06 ENCOUNTER — Ambulatory Visit (INDEPENDENT_AMBULATORY_CARE_PROVIDER_SITE_OTHER): Payer: BC Managed Care – PPO | Admitting: Cardiology

## 2018-10-06 ENCOUNTER — Other Ambulatory Visit: Payer: Self-pay

## 2018-10-06 VITALS — BP 119/65 | Ht 63.0 in | Wt 135.0 lb

## 2018-10-06 DIAGNOSIS — I351 Nonrheumatic aortic (valve) insufficiency: Secondary | ICD-10-CM

## 2018-10-06 DIAGNOSIS — E7800 Pure hypercholesterolemia, unspecified: Secondary | ICD-10-CM

## 2018-10-06 DIAGNOSIS — I10 Essential (primary) hypertension: Secondary | ICD-10-CM | POA: Diagnosis not present

## 2018-10-06 DIAGNOSIS — I25118 Atherosclerotic heart disease of native coronary artery with other forms of angina pectoris: Secondary | ICD-10-CM

## 2018-10-06 NOTE — Progress Notes (Signed)
Virtual Visit via Video Note: This visit type was conducted due to national recommendations for restrictions regarding the COVID-19 Pandemic (e.g. social distancing).  This format is felt to be most appropriate for this patient at this time.  All issues noted in this document were discussed and addressed.  No physical exam was performed (except for noted visual exam findings with Telehealth visits).  The patient has consented to conduct a Telehealth visit and understands insurance will be billed.   I connected with@, on 10/06/18 at  by a video enabled telemedicine application and verified that I am speaking with the correct person using two identifiers.   I discussed the limitations of evaluation and management by telemedicine and the availability of in person appointments. The patient expressed understanding and agreed to proceed.   I have discussed with patient regarding the safety during COVID Pandemic and steps and precautions to be taken including social distancing, frequent hand wash and use of detergent soap, gels with the patient. I asked the patient to avoid touching mouth, nose, eyes, ears with the hands. I encouraged regular walking around the neighborhood and exercise and regular diet, as long as social distancing can be maintained.  Primary Physician/Referring:  Donald Prose, MD  Patient ID: [name], female    DOB: [birth year], 68 y.o.   MRN: [id number]  Chief Complaint  Patient presents with   Atrial Fibrillation    HPI: Suzanne Hunt  is a 68 y.o. female  with  African-American female with hypertension, hyperlipidemia, she is a nonsmoker admitted to [facility] on 12/08/2017 anterior STEMI,S/P PCI to mid to distal LAD with balloon angioplasty. She developed coarse atrial fibrillation with controlled ventricular response. Anticoagulation discontinued as she converted to sinus and maintained sinus on OV 3 months later.   She also has mild aortic  regurgitation.  Has had occasional episodes of angina pectoris for which she has taken nitroglycerin with immediate relief.  She is otherwise presently doing well, has not had any worsening dyspnea, palpitations or heartburn.  She is tolerating dual antiplatelet therapy without bleeding complications. She has been walking 4 miles a day.   Past Medical History:  Diagnosis Date   Coronary artery disease    Hypertension    Medical history non-contributory    Myocardial infarction [facility])     Past Surgical History:  Procedure Laterality Date   CORONARY/GRAFT ACUTE MI REVASCULARIZATION N/A 12/08/2017   Procedure: Coronary/Graft Acute MI Revascularization;  Surgeon: Adrian Prows, MD;  Location: [place] CV LAB;  Service: Cardiovascular;  Laterality: N/A;   LEFT HEART CATH AND CORONARY ANGIOGRAPHY N/A 12/08/2017   Procedure: LEFT HEART CATH AND CORONARY ANGIOGRAPHY;  Surgeon: Adrian Prows, MD;  Location: [place] CV LAB;  Service: Cardiovascular;  Laterality: N/A;   TEE WITHOUT CARDIOVERSION N/A 03/25/2018   Procedure: TRANSESOPHAGEAL ECHOCARDIOGRAM (TEE);  Surgeon: Adrian Prows, MD;  Location: [facility] ENDOSCOPY;  Service: Cardiovascular;  Laterality: N/A;    Social History   Socioeconomic History   Marital status: Married    Spouse name: Nishi Neiswonger   Number of children: 1   Years of education: Not on file   Highest education level: Not on file  Occupational History   Not on file  Social Needs   Financial resource strain: Not hard at all   Food insecurity    Worry: Never true    Inability: Never true   Transportation needs    Medical: No    Non-medical: No  Tobacco Use  Smoking status: Never Smoker   Smokeless tobacco: Never Used  Substance and Sexual Activity   Alcohol use: No   Drug use: No   Sexual activity: Yes    Birth control/protection: None  Lifestyle   Physical activity    Days per week: 5 days    Minutes per session: 60 min   Stress: Only a  little  Relationships   Social connections    Talks on phone: More than three times a week    Gets together: More than three times a week    Attends religious service: More than 4 times per year    Active member of club or organization: Yes    Attends meetings of clubs or organizations: 1 to 4 times per year    Relationship status: Married   Intimate partner violence    Fear of current or ex partner: No    Emotionally abused: No    Physically abused: No    Forced sexual activity: No  Other Topics Concern   Not on file  Social History Narrative   Not on file   Review of Systems  Constitution: Negative for chills, decreased appetite, malaise/fatigue and weight gain.  Cardiovascular: Negative for dyspnea on exertion, leg swelling and syncope.  Endocrine: Negative for cold intolerance.  Hematologic/Lymphatic: Does not bruise/bleed easily.  Musculoskeletal: Negative for joint swelling.  Gastrointestinal: Positive for heartburn (occasional). Negative for abdominal pain, anorexia, change in bowel habit, hematochezia and melena.  Neurological: Negative for headaches and light-headedness.  Psychiatric/Behavioral: Negative for depression and substance abuse.  All other systems reviewed and are negative.     Objective  Blood pressure 119/65, height 5' 3"  (1.6 m), weight 135 lb (61.2 kg). Body mass index is 23.91 kg/m.   Physical exam not performed or limited due to virtual visit.  Patient appeared to be in no distress, Neck was supple, respiration was not labored.  Please see exam details from prior visit is as below. Getting any better 10 PM the right okay Physical Exam  Constitutional: She appears well-developed and well-nourished. No distress.  HENT:  Head: Atraumatic.  Eyes: Conjunctivae are normal.  Neck: Neck supple. No JVD present. No thyromegaly present.  Cardiovascular: Normal rate, regular rhythm, S1 normal, S2 normal and intact distal pulses. Exam reveals no gallop.    Murmur heard.  Harsh midsystolic murmur is present with a grade of 2/6 at the upper right sternal border radiating to the neck. Pulmonary/Chest: Effort normal and breath sounds normal.  Abdominal: Soft. Bowel sounds are normal.  Musculoskeletal: Normal range of motion.        General: No edema.  Neurological: She is alert.  Skin: Skin is warm and dry.  Psychiatric: She has a normal mood and affect.   Radiology: No results found.  Laboratory examination:   02/06/2018: Cholesterol 138, triglycerides 40, HDL 53, LDL 77.  Creatinine 1.19, EGFR 48/55, potassium 4.9, alkaline phosphatase 123, ALT 49, CMP otherwise normal.  CBC normal.  01/02/2018: Creatinine 1.23, EGFR 46/53, potassium 4.3, BMP otherwise normal.  CMP Latest Ref Rng & Units 12/09/2017 12/09/2017 12/08/2017  Glucose 70 - 99 mg/dL 92 117(H) 128(H)  BUN 8 - 23 mg/dL 22 17 21   Creatinine 0.44 - 1.00 mg/dL 1.24(H) 0.88 1.05(H)  Sodium 135 - 145 mmol/L 138 135 139  Potassium 3.5 - 5.1 mmol/L 4.1 4.2 4.4  Chloride 98 - 111 mmol/L 108 110 107  CO2 22 - 32 mmol/L 20(L) 21(L) 24  Calcium 8.9 - 10.3 mg/dL  8.7(L) 8.1(L) 8.5(L)  Total Protein 6.5 - 8.1 g/dL - - 6.4(L)  Total Bilirubin 0.3 - 1.2 mg/dL - - 0.6  Alkaline Phos 38 - 126 U/L - - 85  AST 15 - 41 U/L - - 44(H)  ALT 0 - 44 U/L - - 26   CBC Latest Ref Rng & Units 12/09/2017 12/08/2017 12/08/2017  WBC 4.0 - 10.5 K/uL 5.8 5.9 -  Hemoglobin 12.0 - 15.0 g/dL 11.9(L) 12.3 13.3  Hematocrit 36.0 - 46.0 % 37.3 38.4 39.0  Platelets 150 - 400 K/uL 179 185 -   Lipid Panel     Component Value Date/Time   CHOL 178 12/09/2017 0616   TRIG 34 12/09/2017 0616   HDL 58 12/09/2017 0616   CHOLHDL 3.1 12/09/2017 0616   VLDL 7 12/09/2017 0616   LDLCALC 113 (H) 12/09/2017 0616   HEMOGLOBIN A1C Lab Results  Component Value Date   HGBA1C 4.9 12/08/2017   MPG 93.93 12/08/2017   TSH Recent Labs    12/09/17 0616  TSH 3.796     Medications   Medications Discontinued During This  Encounter  Medication Reason   apixaban (ELIQUIS) 5 MG TABS tablet Error   Cholecalciferol (VITAMIN D3) 50 MCG (2000 UT) TABS Error   rosuvastatin (CRESTOR) 20 MG tablet Error   Current Meds  Medication Sig   Calcium Carb-Cholecalciferol (CALCIUM + VITAMIN D3 PO) Take 1 tablet by mouth 2 (two) times daily.   esomeprazole (NEXIUM) 40 MG capsule Take 40 mg by mouth daily as needed (for heartburn).    ibandronate (BONIVA) 150 MG tablet Take 150 mg by mouth every 30 (thirty) days. Take in the morning with a full glass of water, on an empty stomach, and do not take anything else by mouth or lie down for the next 30 min. Takes the first week of the month.   losartan (COZAAR) 25 MG tablet Take 1 tablet (25 mg total) by mouth every evening.   metoprolol tartrate (LOPRESSOR) 25 MG tablet Take 0.5 tablets (12.5 mg total) by mouth 2 (two) times daily.   Multiple Vitamin (MULTIVITAMIN WITH MINERALS) TABS Take 1 tablet by mouth 2 (two) times daily.    nitroGLYCERIN (NITROSTAT) 0.4 MG SL tablet Place 1 tablet (0.4 mg total) under the tongue every 5 (five) minutes x 3 doses as needed for chest pain.   Polyvinyl Alcohol-Povidone PF (REFRESH) 1.4-0.6 % SOLN Place 1 drop into both eyes daily as needed (for dry eyes).   simvastatin (ZOCOR) 10 MG tablet Take 10 mg by mouth daily.   ticagrelor (BRILINTA) 90 MG TABS tablet Take 1 tablet (90 mg total) by mouth 2 (two) times daily.    Cardiac Studies:    TEE 03/25/2018: Normal LV systolic function, trileaflet aortic valve. There is mild coaptation of the non-and left coronary cusp at the base leading to eccentric mild aortic valve regurgitation. No vegetation. Otherwise no significant abnormalities identified. Compared to the TTE 12/09/2017, EF is improved from 35-40%.  Echo- 01/28/2018 1. Left ventricle cavity is normal in size. Normal global wall motion. Calculated EF 60%. 2. Left atrial cavity is slightly dilated. 3. Right atrial cavity is mildly  dilated. 4. Mild (Grade I) perivalvular aortic regurgitation, may be due to fistula. Mild calcification of the aortic valve annulus. 5. Mild to moderate mitral regurgitation. Mild calcification of the mitral valve annulus. 6. Mild to moderate tricuspid regurgitation. Mild pulmonary hypertension with approx. PA syst. pressure of 33 mm of Hg. 7. IVC is dilated (suggests  elevated RA pressure) with respiratory variation. 8. Suggest TEE to evaluate for fistula in view of perivalvular aortic regurgitatio  Assessment   Coronary artery disease of native artery of native heart with stable angina pectoris ([place]) -  11/25/2017: Mid LAD balloon PTCA   Essential hypertension   Hypercholesteremia   Mild aortic regurgitation   EKG 02/03/2018: Marked sinus bradycardia at rate of 50 bpm, normal axis, anteroseptal infarct old. Diffuse nonspecific T-wave flattening. Low-voltage complexes.  EKG 12/18/2017: Coarse atrial fibrillation with controlled ventricular response at the rate of 77 bpm, normal axis, anterolateral infarct old with persistent deep T wave inversion in V1 to V6. Abnormal EKG.  Recommendations:   Ms. Naba Sneed is an African-American female with hypertension, hyperlipidemia, admitted with ACS on 12/08/2017 anterior STEMI,S/P PCI to mid to distal LAD with balloon angioplasty. She developed coarse atrial fibrillation with controlled ventricular response, however she has maintained sinus rhythm and 3 after  months anticoagulation was discontinued.  She has angina pectoris that is stable, she is presently doing well, her lipids are well controlled, I will discontinue Brilinta after I see her in our office in 2-3 months.   She does have mild aortic regurgitation but asymptomatic. Blood pressure recordings from today, reviewed and good control.   Adrian Prows, MD, [facility] 10/06/2018, 1:08 PM [facility]. [place] Pager: [phone] Office: [phone] If no answer Cell [phone]

## 2018-11-13 ENCOUNTER — Other Ambulatory Visit: Payer: Self-pay

## 2018-11-13 ENCOUNTER — Ambulatory Visit
Admission: RE | Admit: 2018-11-13 | Discharge: 2018-11-13 | Disposition: A | Discharge status: Home / Self Care | Payer: BC Managed Care – PPO | Source: Ambulatory Visit | Attending: Family Medicine | Admitting: Family Medicine

## 2018-11-13 DIAGNOSIS — Z1231 Encounter for screening mammogram for malignant neoplasm of breast: Secondary | ICD-10-CM

## 2018-12-05 DIAGNOSIS — Z0000 Encounter for general adult medical examination without abnormal findings: Secondary | ICD-10-CM | POA: Diagnosis not present

## 2018-12-05 DIAGNOSIS — I25118 Atherosclerotic heart disease of native coronary artery with other forms of angina pectoris: Secondary | ICD-10-CM | POA: Diagnosis not present

## 2018-12-05 DIAGNOSIS — Z23 Encounter for immunization: Secondary | ICD-10-CM | POA: Diagnosis not present

## 2018-12-05 DIAGNOSIS — M810 Age-related osteoporosis without current pathological fracture: Secondary | ICD-10-CM | POA: Diagnosis not present

## 2018-12-05 DIAGNOSIS — E7800 Pure hypercholesterolemia, unspecified: Secondary | ICD-10-CM | POA: Diagnosis not present

## 2018-12-05 DIAGNOSIS — I4891 Unspecified atrial fibrillation: Secondary | ICD-10-CM | POA: Diagnosis not present

## 2018-12-05 DIAGNOSIS — Z1389 Encounter for screening for other disorder: Secondary | ICD-10-CM | POA: Diagnosis not present

## 2018-12-05 DIAGNOSIS — K219 Gastro-esophageal reflux disease without esophagitis: Secondary | ICD-10-CM | POA: Diagnosis not present

## 2019-01-07 ENCOUNTER — Other Ambulatory Visit: Payer: Self-pay

## 2019-01-07 ENCOUNTER — Ambulatory Visit (INDEPENDENT_AMBULATORY_CARE_PROVIDER_SITE_OTHER): Payer: BC Managed Care – PPO | Admitting: Cardiology

## 2019-01-07 ENCOUNTER — Encounter: Payer: Self-pay | Admitting: Cardiology

## 2019-01-07 VITALS — BP 128/81 | HR 62 | Ht 63.0 in | Wt 131.0 lb

## 2019-01-07 DIAGNOSIS — I25118 Atherosclerotic heart disease of native coronary artery with other forms of angina pectoris: Secondary | ICD-10-CM | POA: Diagnosis not present

## 2019-01-07 DIAGNOSIS — E7800 Pure hypercholesterolemia, unspecified: Secondary | ICD-10-CM | POA: Diagnosis not present

## 2019-01-07 DIAGNOSIS — I2510 Atherosclerotic heart disease of native coronary artery without angina pectoris: Secondary | ICD-10-CM

## 2019-01-07 DIAGNOSIS — I10 Essential (primary) hypertension: Secondary | ICD-10-CM | POA: Diagnosis not present

## 2019-01-07 DIAGNOSIS — I351 Nonrheumatic aortic (valve) insufficiency: Secondary | ICD-10-CM

## 2019-01-07 HISTORY — DX: Atherosclerotic heart disease of native coronary artery without angina pectoris: I25.10

## 2019-01-07 HISTORY — DX: Essential (primary) hypertension: I10

## 2019-01-07 MED ORDER — NITROGLYCERIN 0.4 MG SL SUBL: 0.4000 mg | SUBLINGUAL_TABLET | SUBLINGUAL | 6 refills | Status: DC | PRN

## 2019-01-07 MED ORDER — ROSUVASTATIN CALCIUM 20 MG PO TABS: 20.0000 mg | ORAL_TABLET | Freq: Every day | ORAL | 3 refills | Status: DC

## 2019-01-07 MED ORDER — ASPIRIN EC 81 MG PO TBEC: 81.0000 mg | DELAYED_RELEASE_TABLET | Freq: Every day | ORAL | 3 refills | Status: DC

## 2019-01-07 NOTE — Progress Notes (Signed)
Primary Physician/Referring:  Donald Prose, MD  Patient ID: Suzanne Hunt, female    DOB: [birth year], 68 y.o.   MRN: [id number]  Chief Complaint  Patient presents with  . Coronary Artery Disease  . Hypertension  . Follow-up    HPI: Suzanne Hunt  is a 68 y.o. female  with  African-American female with hypertension, hyperlipidemia, she is a nonsmoker admitted to [facility] on 12/08/2017 anterior STEMI,S/P PCI to mid to distal LAD with balloon angioplasty. She developed coarse atrial fibrillation with controlled ventricular response. Anticoagulation discontinued as she converted to sinus and maintained sinus on OV 3 months later.   She also has mild aortic regurgitation.  Has had occasional episodes of angina pectoris for which she has taken nitroglycerin with immediate relief.  Only occurring once or twice a month.   She is otherwise presently doing well, has not had any worsening dyspnea, palpitations or heartburn.  She is tolerating dual antiplatelet therapy without bleeding complications. She has been walking 4-5 miles a day, 6 days a week. States her lipids are slightly high compared to 1 year ago. She does admit to some leg cramps with Zocor.   Past Medical History:  Diagnosis Date  . CAD (coronary artery disease) 01/07/2019  . Coronary artery disease   . HTN (hypertension) 01/07/2019  . Hypertension   . Medical history non-contributory   . Myocardial infarction [facility])     Past Surgical History:  Procedure Laterality Date  . CORONARY/GRAFT ACUTE MI REVASCULARIZATION N/A 12/08/2017   Procedure: Coronary/Graft Acute MI Revascularization;  Surgeon: Adrian Prows, MD;  Location: [place] [facility];  Service: Cardiovascular;  Laterality: N/A;  . LEFT HEART CATH AND CORONARY ANGIOGRAPHY N/A 12/08/2017   Procedure: LEFT HEART CATH AND CORONARY ANGIOGRAPHY;  Surgeon: Adrian Prows, MD;  Location: [place] [facility];  Service: Cardiovascular;  Laterality: N/A;  . TEE WITHOUT  CARDIOVERSION N/A 03/25/2018   Procedure: TRANSESOPHAGEAL ECHOCARDIOGRAM (TEE);  Surgeon: Adrian Prows, MD;  Location: [facility] ENDOSCOPY;  Service: Cardiovascular;  Laterality: N/A;    Social History   Socioeconomic History  . Marital status: Married    Spouse name: Chyrl Elwell  . Number of children: 1  . Years of education: Not on file  . Highest education level: Not on file  Occupational History  . Not on file  Social Needs  . Financial resource strain: Not hard at all  . Food insecurity    Worry: Never true    Inability: Never true  . Transportation needs    Medical: No    Non-medical: No  Tobacco Use  . Smoking status: Never Smoker  . Smokeless tobacco: Never Used  Substance and Sexual Activity  . Alcohol use: No  . Drug use: No  . Sexual activity: Yes    Birth control/protection: None  Lifestyle  . Physical activity    Days per week: 5 days    Minutes per session: 60 min  . Stress: Only a little  Relationships  . Social connections    Talks on phone: More than three times a week    Gets together: More than three times a week    Attends religious service: More than 4 times per year    Active member of club or organization: Yes    Attends meetings of clubs or organizations: 1 to 4 times per year    Relationship status: Married  . Intimate partner violence    Fear of current or ex partner:  No    Emotionally abused: No    Physically abused: No    Forced sexual activity: No  Other Topics Concern  . Not on file  Social History Narrative  . Not on file   Review of Systems  Constitution: Negative for chills, decreased appetite, malaise/fatigue and weight gain.  Cardiovascular: Negative for dyspnea on exertion, leg swelling and syncope.  Endocrine: Negative for cold intolerance.  Hematologic/Lymphatic: Does not bruise/bleed easily.  Musculoskeletal: Negative for joint swelling.  Gastrointestinal: Positive for heartburn (occasional). Negative for abdominal pain,  anorexia, change in bowel habit, hematochezia and melena.  Neurological: Negative for headaches and light-headedness.  Psychiatric/Behavioral: Negative for depression and substance abuse.  All other systems reviewed and are negative.     Objective  Blood pressure 128/81, pulse 62, height 5' 3"  (1.6 m), weight 131 lb (59.4 kg), SpO2 100 %. Body mass index is 23.21 kg/m.   Physical Exam  Constitutional: She appears well-developed and well-nourished. No distress.  HENT:  Head: Atraumatic.  Eyes: Conjunctivae are normal.  Neck: Neck supple. No JVD present. No thyromegaly present.  Cardiovascular: Normal rate, regular rhythm, S1 normal, S2 normal and intact distal pulses. Exam reveals no gallop.  Murmur heard.  Harsh midsystolic murmur is present with a grade of 2/6 at the upper right sternal border radiating to the neck. Pulmonary/Chest: Effort normal and breath sounds normal.  Abdominal: Soft. Bowel sounds are normal.  Musculoskeletal: Normal range of motion.        General: No edema.  Neurological: She is alert.  Skin: Skin is warm and dry.  Psychiatric: She has a normal mood and affect.   Radiology: No results found.  Laboratory examination:   12/05/2018: Cholesterol 139, triglycerides 37, HDL 52, LDL 80.  Creatinine 1.03, EGFR 65, potassium 4.5, CMP normal.  TSH normal.  CBC normal.  02/06/2018: Cholesterol 138, triglycerides 40, HDL 53, LDL 77.  Creatinine 1.19, EGFR 48/55, potassium 4.9, alkaline phosphatase 123, ALT 49, CMP otherwise normal.  CBC normal.  01/02/2018: Creatinine 1.23, EGFR 46/53, potassium 4.3, BMP otherwise normal.  CMP Latest Ref Rng & Units 12/09/2017 12/09/2017 12/08/2017  Glucose 70 - 99 mg/dL 92 117(H) 128(H)  BUN 8 - 23 mg/dL 22 17 21   Creatinine 0.44 - 1.00 mg/dL 1.24(H) 0.88 1.05(H)  Sodium 135 - 145 mmol/L 138 135 139  Potassium 3.5 - 5.1 mmol/L 4.1 4.2 4.4  Chloride 98 - 111 mmol/L 108 110 107  CO2 22 - 32 mmol/L 20(L) 21(L) 24  Calcium 8.9 -  10.3 mg/dL 8.7(L) 8.1(L) 8.5(L)  Total Protein 6.5 - 8.1 g/dL - - 6.4(L)  Total Bilirubin 0.3 - 1.2 mg/dL - - 0.6  Alkaline Phos 38 - 126 U/L - - 85  AST 15 - 41 U/L - - 44(H)  ALT 0 - 44 U/L - - 26   CBC Latest Ref Rng & Units 12/09/2017 12/08/2017 12/08/2017  WBC 4.0 - 10.5 K/uL 5.8 5.9 -  Hemoglobin 12.0 - 15.0 g/dL 11.9(L) 12.3 13.3  Hematocrit 36.0 - 46.0 % 37.3 38.4 39.0  Platelets 150 - 400 K/uL 179 185 -   Lipid Panel     Component Value Date/Time   CHOL 178 12/09/2017 0616   TRIG 34 12/09/2017 0616   HDL 58 12/09/2017 0616   CHOLHDL 3.1 12/09/2017 0616   VLDL 7 12/09/2017 0616   LDLCALC 113 (H) 12/09/2017 0616   HEMOGLOBIN A1C Lab Results  Component Value Date   HGBA1C 4.9 12/08/2017   MPG 93.93  12/08/2017   TSH No results for input(s): TSH in the last 8760 hours.   Medications   There are no discontinued medications. Current Meds  Medication Sig  . Calcium Carb-Cholecalciferol (CALCIUM + VITAMIN D3 PO) Take 1 tablet by mouth 2 (two) times daily.  Marland Kitchen esomeprazole (NEXIUM) 40 MG capsule Take 40 mg by mouth daily as needed (for heartburn).   . ibandronate (BONIVA) 150 MG tablet Take 150 mg by mouth every 30 (thirty) days. Take in the morning with a full glass of water, on an empty stomach, and do not take anything else by mouth or lie down for the next 30 min. Takes the first week of the month.  . losartan (COZAAR) 25 MG tablet Take 1 tablet (25 mg total) by mouth every evening.  . metoprolol tartrate (LOPRESSOR) 25 MG tablet Take 0.5 tablets (12.5 mg total) by mouth 2 (two) times daily.  . Multiple Vitamin (MULTIVITAMIN WITH MINERALS) TABS Take 1 tablet by mouth 2 (two) times daily.   . nitroGLYCERIN (NITROSTAT) 0.4 MG SL tablet Place 1 tablet (0.4 mg total) under the tongue every 5 (five) minutes x 3 doses as needed for chest pain.  . Polyvinyl Alcohol-Povidone PF (REFRESH) 1.4-0.6 % SOLN Place 1 drop into both eyes daily as needed (for dry eyes).  . simvastatin  (ZOCOR) 10 MG tablet Take 10 mg by mouth daily.  . ticagrelor (BRILINTA) 90 MG TABS tablet Take 1 tablet (90 mg total) by mouth 2 (two) times daily.    Cardiac Studies:    TEE 03/25/2018: Normal LV systolic function, trileaflet aortic valve. There is mild coaptation of the non-and left coronary cusp at the base leading to eccentric mild aortic valve regurgitation. No vegetation. Otherwise no significant abnormalities identified. Compared to the TTE 12/09/2017, EF is improved from 35-40%.  Echo- 01/28/2018 1. Left ventricle cavity is normal in size. Normal global wall motion. Calculated EF 60%. 2. Left atrial cavity is slightly dilated. 3. Right atrial cavity is mildly dilated. 4. Mild (Grade I) perivalvular aortic regurgitation, may be due to fistula. Mild calcification of the aortic valve annulus. 5. Mild to moderate mitral regurgitation. Mild calcification of the mitral valve annulus. 6. Mild to moderate tricuspid regurgitation. Mild pulmonary hypertension with approx. PA syst. pressure of 33 mm of Hg. 7. IVC is dilated (suggests elevated RA pressure) with respiratory variation. 8. Suggest TEE to evaluate for fistula in view of perivalvular aortic regurgitatio  Assessment   Coronary artery disease of native artery of native heart with stable angina pectoris ([place]) -  11/25/2017: Mid LAD balloon PTCA   Essential hypertension   Hypercholesteremia   Mild aortic regurgitation   EKG 01/07/2019: Sinus bradycardia 55 bpm with 1 PAC, left atrial abnormality, normal axis, anteroseptal infarct old.  Diffuse nonspecific T wave flattening. No changes compared to previous EKG.   EKG 12/18/2017: Coarse atrial fibrillation with controlled ventricular response at the rate of 77 bpm, normal axis, anterolateral infarct old with persistent deep T wave inversion in V1 to V6. Abnormal EKG.  Recommendations:   Suzanne Hunt is an African-American female with hypertension, hyperlipidemia,  admitted with ACS on 12/08/2017 anterior STEMI,S/P PCI to mid to distal LAD with balloon angioplasty. She developed coarse atrial fibrillation with controlled ventricular response, however she has maintained sinus rhythm and 3 after  months anticoagulation was discontinued.  Patient has occasional symptoms of angina that is resolved with nitroglycerin, only occasionally and has been stable.  Continue with present medical therapy.  She  has been greater than 1 year since stenting, will discontinue her Brilinta and continue with aspirin indefinitely.  Blood pressure is well controlled.  She does have one PACs on EKG but is asymptomatic.  No clinical evidence of heart failure.  She is exercising 6 days a week and is tolerating this well.  She has made significant diet changes.  She does report cramping in her legs since being on simvastatin.  She previously had well-controlled lipids on Crestor, but states that insurance would no longer cover this.  I will retry to get approval of Crestor as also her lipids are not well controlled with Zocor.  I will see her back in 6 months or sooner if problems.  Miquel Dunn, MSN, APRN, FNP-C [facility]. [place] Office: [phone] Fax: [phone]

## 2019-03-04 ENCOUNTER — Other Ambulatory Visit: Payer: Self-pay | Admitting: Cardiology

## 2019-03-20 ENCOUNTER — Other Ambulatory Visit: Payer: Self-pay

## 2019-03-20 DIAGNOSIS — Z20822 Contact with and (suspected) exposure to covid-19: Secondary | ICD-10-CM

## 2019-03-21 LAB — NOVEL CORONAVIRUS, NAA: SARS-CoV-2, NAA: NOT DETECTED

## 2019-04-20 DIAGNOSIS — H2513 Age-related nuclear cataract, bilateral: Secondary | ICD-10-CM | POA: Diagnosis not present

## 2019-04-20 DIAGNOSIS — H40013 Open angle with borderline findings, low risk, bilateral: Secondary | ICD-10-CM | POA: Diagnosis not present

## 2019-04-20 DIAGNOSIS — H25013 Cortical age-related cataract, bilateral: Secondary | ICD-10-CM | POA: Diagnosis not present

## 2019-04-20 DIAGNOSIS — H04123 Dry eye syndrome of bilateral lacrimal glands: Secondary | ICD-10-CM | POA: Diagnosis not present

## 2019-07-07 ENCOUNTER — Ambulatory Visit: Payer: BC Managed Care – PPO | Admitting: Cardiology

## 2019-07-08 ENCOUNTER — Other Ambulatory Visit: Payer: Self-pay | Admitting: Cardiology

## 2019-07-12 IMAGING — DX DG CHEST 1V PORT
1 series · 1 of 1 positions shown · non-contrast
Comparison: None.

CLINICAL DATA: Sharp central chest pain, shortness of breath

EXAM:
PORTABLE CHEST 1 VIEW

[chest ap]
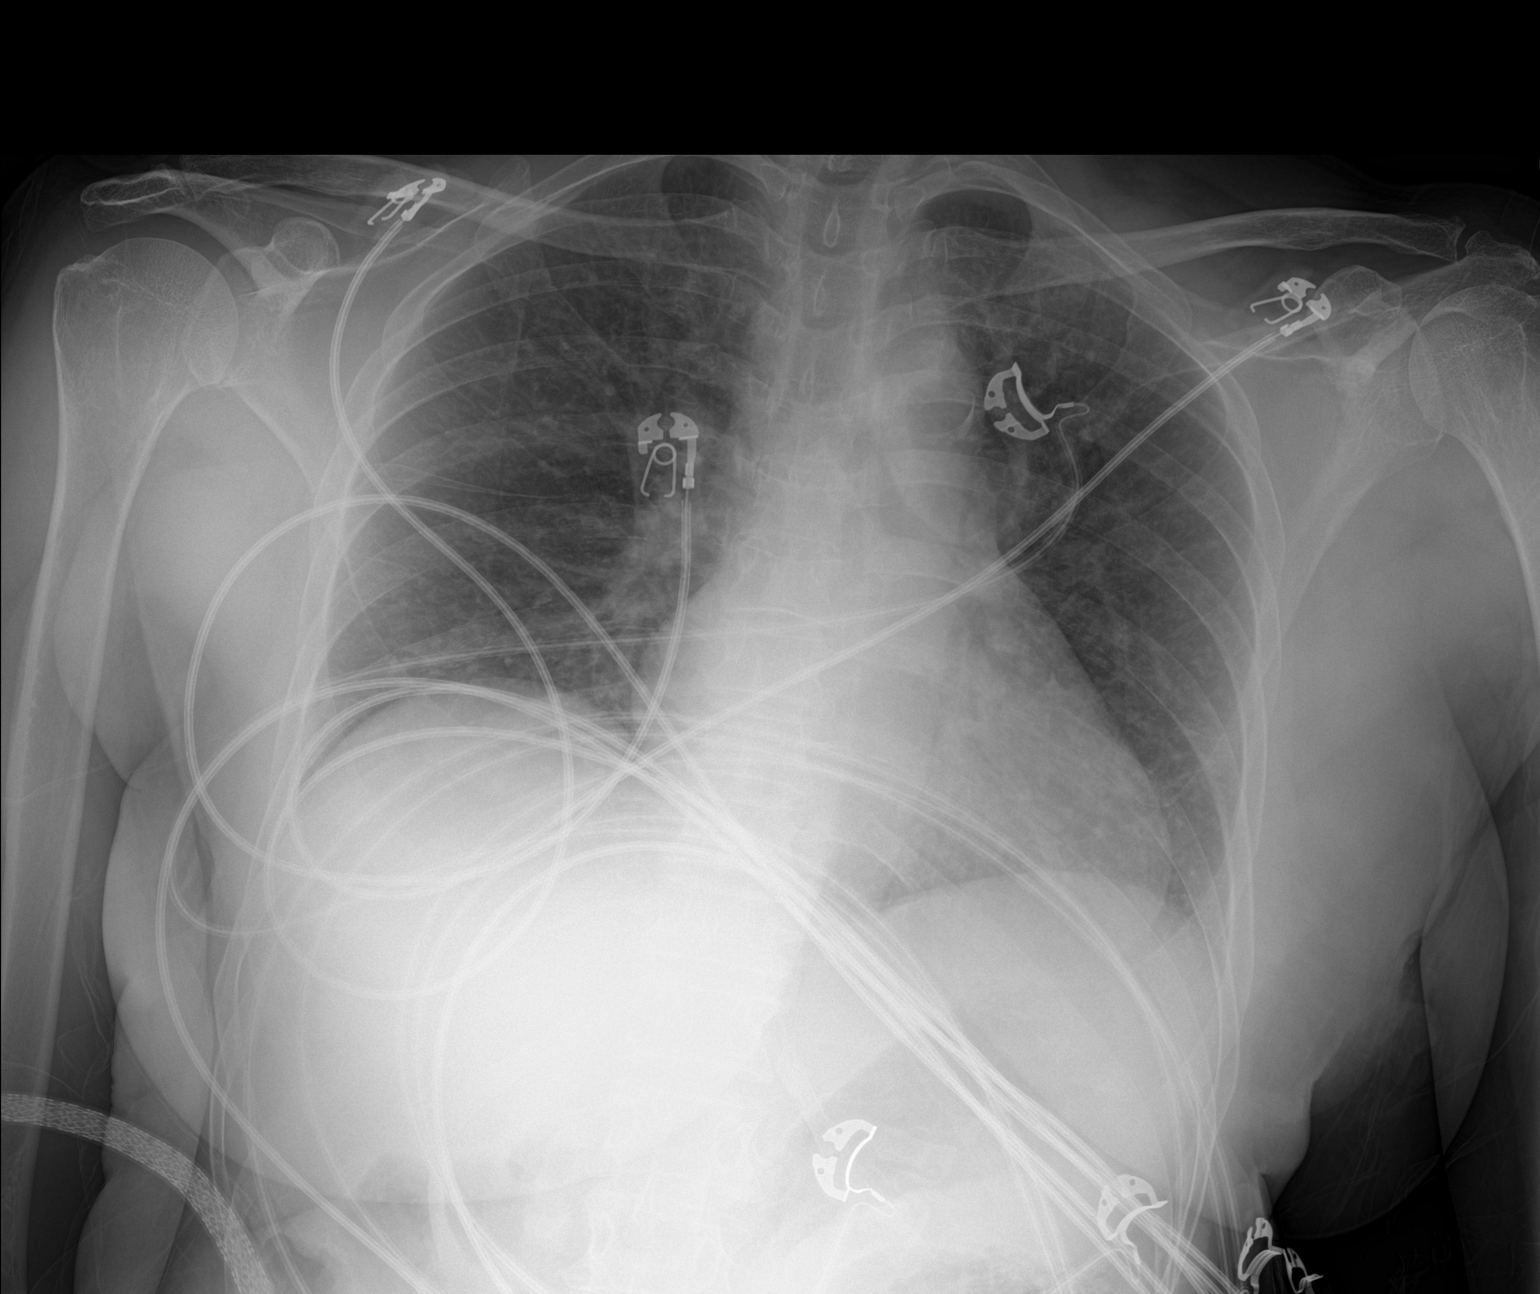

[1 of 1 positions shown; findings below may reference images not displayed]

FINDINGS: Heart is borderline in size. Low lung volumes. Mild vascular
congestion and right base atelectasis. No overt edema or effusions.
No acute bony abnormality.
IMPRESSION: Borderline heart size with mild vascular congestion. Low volumes
with right base atelectasis.

## 2019-07-15 ENCOUNTER — Ambulatory Visit: Payer: BC Managed Care – PPO | Admitting: Cardiology

## 2019-07-16 ENCOUNTER — Telehealth: Payer: Self-pay

## 2019-07-16 ENCOUNTER — Ambulatory Visit: Payer: Medicare Other | Admitting: Cardiology

## 2019-07-16 ENCOUNTER — Other Ambulatory Visit: Payer: Self-pay

## 2019-07-16 ENCOUNTER — Encounter: Payer: Self-pay | Admitting: Cardiology

## 2019-07-16 VITALS — BP 142/75 | HR 65 | Temp 98.3°F | Ht 63.0 in | Wt 129.0 lb

## 2019-07-16 DIAGNOSIS — I351 Nonrheumatic aortic (valve) insufficiency: Secondary | ICD-10-CM

## 2019-07-16 DIAGNOSIS — I484 Atypical atrial flutter: Secondary | ICD-10-CM | POA: Diagnosis not present

## 2019-07-16 DIAGNOSIS — I2510 Atherosclerotic heart disease of native coronary artery without angina pectoris: Secondary | ICD-10-CM

## 2019-07-16 DIAGNOSIS — E782 Mixed hyperlipidemia: Secondary | ICD-10-CM | POA: Diagnosis not present

## 2019-07-16 DIAGNOSIS — I10 Essential (primary) hypertension: Secondary | ICD-10-CM | POA: Diagnosis not present

## 2019-07-16 DIAGNOSIS — I252 Old myocardial infarction: Secondary | ICD-10-CM

## 2019-07-16 DIAGNOSIS — Z955 Presence of coronary angioplasty implant and graft: Secondary | ICD-10-CM | POA: Diagnosis not present

## 2019-07-16 NOTE — Telephone Encounter (Signed)
Hello guys, It does not bother me if any patient of our practice wants to see anyone. Dont worry to make changes to any patient preferences. We want patients to be pleased and wanted and cared for. I am so happy to hear that Dr. Karie Schwalbe is famous. JG

## 2019-07-16 NOTE — Progress Notes (Signed)
[name] Date of Birth: 07-23-1950 MRN: [id number] Primary Care Provider:Sun, Gari Crown, MD  Date: 07/16/19 Last Office Visit: 01/07/2019   Chief Complaint  Patient presents with  . Follow-up    History of heart attack    HPI  Suzanne Hunt is a 69 y.o.  female who presents to the office with a chief complaint of " follow-up, history of heart attack." Patient's past medical history and cardiovascular risk factors include: Hypertension, hyperlipidemia, history of anterior STEMI status post PCI to the mid/distal LAD with balloon angioplasty, history of atrial fibrillation, postmenopausal female, advanced age.    Patient was last seen in the office on January 07, 2019 by Miquel Dunn, MSN, APRN, FNP-C in the past was seen by Dr. Adrian Prows in December 2019.  History of myocardial infarction status post angioplasty and stenting: Since last office visit patient states that she has been doing well from a cardiovascular standpoint.  She does not have any active chest pain at today's office visit.  However, she states that in the interim she has had about 4 episodes of chest discomfort for which she has taken sublingual nitroglycerin tablets.  She has to take 1 sublingual nitroglycerin tablet during each occasion and the symptoms are usually resolved.  Patient denies any associated symptoms of nausea, diaphoresis, effort related dyspnea, lower extremity swelling, orthopnea, paroxysmal nocturnal dyspnea.  Patient's functional activity is excellent for her age.  She walks 6 days a week at least 3 to 4 miles each time.  And does resistance training 5 days a week.  Atrial flutter: Given her history of coronary artery disease with prior intervention she was scheduled for a EKG prior to the visit.  EKG shows atrial flutter most likely atypical with variable conduction.  According to electronic medical records she does have history of coarse atrial fibrillation in the past with a  controlled ventricular rate.  She was on oral anticoagulation with Eliquis for short period of time.  She spontaneously converted to normal sinus rhythm and maintained sinus rhythm and on office visit 3 months later it was discontinued.  This information is from the last office note dated 01/07/2019.  Patient denies any known history of intracranial bleeding or internal bleeding.  While she was on Eliquis she tolerated the medication well without any side effects or intolerances.  Patient also denies any recent falls.  History of myocardial infarction (anterior STEMI), coronary artery disease with PCI to the mid/distal LAD. Denies prior history of congestive heart failure, deep venous thrombosis, pulmonary embolism, stroke, transient ischemic attack.  FUNCTIONAL STATUS: She walks 6 days a week at least 3 to 4 miles each time.  And does resistance training 5 days a week.  ALLERGIES: Allergies  Allergen Reactions  . Actinel [Pseudoephedrine-Dm-Gg] Other (See Comments)    Flu like symptoms     MEDICATION LIST PRIOR TO VISIT: Current Outpatient Medications on File Prior to Visit  Medication Sig Dispense Refill  . aspirin EC 81 MG tablet Take 1 tablet (81 mg total) by mouth daily. 90 tablet 3  . Calcium Carb-Cholecalciferol (CALCIUM + VITAMIN D3 PO) Take 1 tablet by mouth 2 (two) times daily.    Marland Kitchen ibandronate (BONIVA) 150 MG tablet Take 150 mg by mouth every 30 (thirty) days. Take in the morning with a full glass of water, on an empty stomach, and do not take anything else by mouth or lie down for the next 30 min. Takes the first week of the month.    Marland Kitchen  losartan (COZAAR) 25 MG tablet TAKE 1 TABLET(25 MG) BY MOUTH EVERY EVENING 90 tablet 0  . metoprolol tartrate (LOPRESSOR) 25 MG tablet Take 0.5 tablets (12.5 mg total) by mouth 2 (two) times daily. 60 tablet 2  . Multiple Vitamin (MULTIVITAMIN WITH MINERALS) TABS Take 1 tablet by mouth 2 (two) times daily.     . nitroGLYCERIN (NITROSTAT) 0.4 MG SL  tablet Place 1 tablet (0.4 mg total) under the tongue every 5 (five) minutes x 3 doses as needed for chest pain. 30 tablet 6  . Polyvinyl Alcohol-Povidone PF (REFRESH) 1.4-0.6 % SOLN Place 1 drop into both eyes daily as needed (for dry eyes).    . rosuvastatin (CRESTOR) 20 MG tablet TAKE 1 TABLET(20 MG) BY MOUTH DAILY AT 6 PM 90 tablet 0   No current facility-administered medications on file prior to visit.    PAST MEDICAL HISTORY: Past Medical History:  Diagnosis Date  . CAD (coronary artery disease) 01/07/2019  . Coronary artery disease   . HTN (hypertension) 01/07/2019  . Hypertension   . Medical history non-contributory   . Myocardial infarction [facility])     PAST SURGICAL HISTORY: Past Surgical History:  Procedure Laterality Date  . CORONARY/GRAFT ACUTE MI REVASCULARIZATION N/A 12/08/2017   Procedure: Coronary/Graft Acute MI Revascularization;  Surgeon: Adrian Prows, MD;  Location: [facility];  Service: Cardiovascular;  Laterality: N/A;  . LEFT HEART CATH AND CORONARY ANGIOGRAPHY N/A 12/08/2017   Procedure: LEFT HEART CATH AND CORONARY ANGIOGRAPHY;  Surgeon: Adrian Prows, MD;  Location: [place] [facility];  Service: Cardiovascular;  Laterality: N/A;  . TEE WITHOUT CARDIOVERSION N/A 03/25/2018   Procedure: TRANSESOPHAGEAL ECHOCARDIOGRAM (TEE);  Surgeon: Adrian Prows, MD;  Location: [facility] ENDOSCOPY;  Service: Cardiovascular;  Laterality: N/A;    FAMILY HISTORY: The patient's family history includes Breast cancer (age of onset: 35) in her paternal aunt; Breast cancer (age of onset: 81) in her sister; Diabetes in her father and sister; Heart disease in her mother and sister.   SOCIAL HISTORY:  The patient  reports that she has never smoked. She has never used smokeless tobacco. She reports that she does not drink alcohol or use drugs.  Review of Systems  Constitution: Negative for chills and fever.  HENT: Negative for ear discharge, ear pain and nosebleeds.   Eyes: Negative for blurred  vision and discharge.  Cardiovascular: Negative for chest pain, claudication, dyspnea on exertion, leg swelling, near-syncope, orthopnea, palpitations, paroxysmal nocturnal dyspnea and syncope.  Respiratory: Negative for cough and shortness of breath.   Endocrine: Negative for polydipsia, polyphagia and polyuria.  Hematologic/Lymphatic: Negative for bleeding problem.  Skin: Negative for flushing and nail changes.  Musculoskeletal: Negative for muscle cramps, muscle weakness and myalgias.  Gastrointestinal: Negative for abdominal pain, dysphagia, hematemesis, hematochezia, melena, nausea and vomiting.  Neurological: Negative for dizziness, focal weakness and light-headedness.    PHYSICAL EXAM: Vitals with BMI 07/16/2019 01/07/2019 10/06/2018  Height _0  _1  _2   Weight 129 lbs 131 lbs 135 lbs  BMI 22.86 48.25 00.37  Systolic 048 889 169  Diastolic 75 81 65  Pulse 65 62 -    CONSTITUTIONAL: Well-developed and well-nourished. No acute distress.  SKIN: Skin is warm and dry. No rash noted. No cyanosis. No pallor. No jaundice HEAD: Normocephalic and atraumatic.  EYES: No scleral icterus MOUTH/THROAT: Moist oral membranes.  NECK: No JVD present. No thyromegaly noted. No carotid bruits  LYMPHATIC: No visible cervical adenopathy.  CHEST Normal respiratory effort. No intercostal retractions  LUNGS: Clear to auscultation bilaterally.  No stridor. No wheezes. No rales.  CARDIOVASCULAR: Irregularly irregular, positive L7-L8, soft holosystolic murmur heard at the apex, no gallops or rubs. ABDOMINAL: Nonobese, soft, nontender, nondistended, positive bowel sounds in all 4 quadrants.  No apparent ascites.  EXTREMITIES: No peripheral edema  HEMATOLOGIC: No significant bruising NEUROLOGIC: Oriented to person, place, and time. Nonfocal. Normal muscle tone.  PSYCHIATRIC: Normal mood and affect. Normal behavior. Cooperative  CARDIAC DATABASE: EKG: 12/18/2017: Coarse atrial fibrillation with  controlled ventricular response at the rate of 77 bpm, normal axis, anterolateral infarct old with persistent deep T wave inversion in V1 to V6. Abnormal EKG. 01/07/2019: Sinus bradycardia 55 bpm with 1 PAC, left atrial abnormality, normal axis, anteroseptal infarct old.  Diffuse nonspecific T wave flattening. No changes compared to previous EKG.  07/16/2019:Atrial flutter, controlled ventricular rate, normal axis deviation no underlying injury pattern.  Echocardiogram: TEE 03/25/2018: Normal LV systolic function, trileaflet aortic valve. There is mild coaptation of the non-and left coronary cusp at the base leading to eccentric mild aortic valve regurgitation. No vegetation. Otherwise no significant abnormalities identified. Compared to the TTE 12/09/2017, EF is improved from 35-40%.  Transthoracic echo 01/28/2018: LVEF 60%, biatrial dilatation mild AR, mild to moderate MR, mild MAC, mild to moderate MR, mild to moderate TR, RVSP 33 mmHg.  Stress Testing:  None  Heart Catheterization: 12/08/2017:  Dist RCA lesion is 30% stenosed.  Mid LAD to Dist LAD lesion is 100% stenosed. Balloon angioplasty was performed using a BALLOON SAPPHIRE 2.0X12.  Post intervention, there is a 10% residual stenosis at the apical LAD. Excellent blush and timi 3 flow was present.  Prox LAD lesion is 30% stenosed.  LV end diastolic pressure is normal.  There is mild left ventricular systolic dysfunction.  The left ventricular ejection fraction is 45-50% by visual estimate.  Balloon angioplasty was performed using a BALLOON SAPPHIRE 2.0X12.   LABORATORY DATA: CBC Latest Ref Rng & Units 12/09/2017 12/08/2017 12/08/2017  WBC 4.0 - 10.5 K/uL 5.8 5.9 -  Hemoglobin 12.0 - 15.0 g/dL 11.9(L) 12.3 13.3  Hematocrit 36.0 - 46.0 % 37.3 38.4 39.0  Platelets 150 - 400 K/uL 179 185 -    CMP Latest Ref Rng & Units 12/09/2017 12/09/2017 12/08/2017  Glucose 70 - 99 mg/dL 92 117(H) 128(H)  BUN 8 - 23 mg/dL _0 Creatinine  0.44 - 1.00 mg/dL 1.24(H) 0.88 1.05(H)  Sodium 135 - 145 mmol/L 138 135 139  Potassium 3.5 - 5.1 mmol/L 4.1 4.2 4.4  Chloride 98 - 111 mmol/L 108 110 107  CO2 22 - 32 mmol/L 20(L) 21(L) 24  Calcium 8.9 - 10.3 mg/dL 8.7(L) 8.1(L) 8.5(L)  Total Protein 6.5 - 8.1 g/dL - - 6.4(L)  Total Bilirubin 0.3 - 1.2 mg/dL - - 0.6  Alkaline Phos 38 - 126 U/L - - 85  AST 15 - 41 U/L - - 44(H)  ALT 0 - 44 U/L - - 26    Lipid Panel     Component Value Date/Time   CHOL 178 12/09/2017 0616   TRIG 34 12/09/2017 0616   HDL 58 12/09/2017 0616   CHOLHDL 3.1 12/09/2017 0616   VLDL 7 12/09/2017 0616   LDLCALC 113 (H) 12/09/2017 0616    Lab Results  Component Value Date   HGBA1C 4.9 12/08/2017   No components found for: NTPROBNP Lab Results  Component Value Date   TSH 3.796 12/09/2017   FINAL MEDICATION LIST END OF ENCOUNTER: No orders of  the defined types were placed in this encounter.   There are no discontinued medications.   Current Outpatient Medications:  .  aspirin EC 81 MG tablet, Take 1 tablet (81 mg total) by mouth daily., Disp: 90 tablet, Rfl: 3 .  Calcium Carb-Cholecalciferol (CALCIUM + VITAMIN D3 PO), Take 1 tablet by mouth 2 (two) times daily., Disp: , Rfl:  .  ibandronate (BONIVA) 150 MG tablet, Take 150 mg by mouth every 30 (thirty) days. Take in the morning with a full glass of water, on an empty stomach, and do not take anything else by mouth or lie down for the next 30 min. Takes the first week of the month., Disp: , Rfl:  .  losartan (COZAAR) 25 MG tablet, TAKE 1 TABLET(25 MG) BY MOUTH EVERY EVENING, Disp: 90 tablet, Rfl: 0 .  metoprolol tartrate (LOPRESSOR) 25 MG tablet, Take 0.5 tablets (12.5 mg total) by mouth 2 (two) times daily., Disp: 60 tablet, Rfl: 2 .  Multiple Vitamin (MULTIVITAMIN WITH MINERALS) TABS, Take 1 tablet by mouth 2 (two) times daily. , Disp: , Rfl:  .  nitroGLYCERIN (NITROSTAT) 0.4 MG SL tablet, Place 1 tablet (0.4 mg total) under the tongue every 5 (five)  minutes x 3 doses as needed for chest pain., Disp: 30 tablet, Rfl: 6 .  Polyvinyl Alcohol-Povidone PF (REFRESH) 1.4-0.6 % SOLN, Place 1 drop into both eyes daily as needed (for dry eyes)., Disp: , Rfl:  .  rosuvastatin (CRESTOR) 20 MG tablet, TAKE 1 TABLET(20 MG) BY MOUTH DAILY AT 6 PM, Disp: 90 tablet, Rfl: 0  IMPRESSION:    ICD-10-CM   1. Atypical atrial flutter (HCC)  Z58.6 Basic Metabolic Panel (BMET)    Magnesium    PCV ECHOCARDIOGRAM COMPLETE  2. Essential hypertension  I10   3. Coronary artery disease involving native coronary artery of native heart without angina pectoris  I25.10 EKG 12-Lead    PCV ECHOCARDIOGRAM COMPLETE  4. Mixed hyperlipidemia  E78.2   5. Mild aortic regurgitation  I35.1   6. Hx of ST elevation myocardial infarction  I25.2 EKG 12-Lead  7. History of coronary angioplasty with insertion of stent  Z95.5      RECOMMENDATIONS: Suzanne Hunt is a 69 y.o. female whose past medical history and cardiovascular risk factors include: Hypertension, hyperlipidemia, history of anterior STEMI status post PCI to the mid/distal LAD with balloon angioplasty, history of atrial fibrillation, postmenopausal female, advanced age.  Atrial Flutter, atypical: . I had a long and detailed discussion with the patient regarding the incidence, etiology, pathophysiology, prognosis, and therapeutic options for atrial flutter.  Specifically we discussed oral anticoagulation for stroke prevention. . I spent a considerable amount of time reviewing atrial flutter, natural history, pathophysiology, rate control vs rhythm control strategy and risk for stroke.   . CHA2DS2-VASc SCORE is 11 (age, prior stroke/TIA, CHF, HTN, DM, vascular disease, female gender) which correlates to 4 % risk of stroke per year.  . I reviewed with patient the benefits and risks of initiating anticoagulation rx, and based on afib treatment guidelines, I recommended long-term anticoagulation for stroke prevention.   Patient does not want to start oral anticoagulation at this time.  She is requesting some time to think about this.  She is educated regarding her stroke risk per year.  She is also educated on seeking medical attention if she develops any new focal neurological deficits in the meantime. . We will check a BMP and magnesium level to evaluate kidney function and electrolytes. Marland Kitchen  Patient is concerned about the side effects of oral anticoagulation i.e.  Bleeding.  She is very functional for her age, she lives an active lifestyle, no history of falls, no history of intracranial bleeding or gastrointestinal bleeding. . Patient will call the office if she decides to be on oral anticoagulation.  She has been on Eliquis in the past and would recommend restarting that based on her kidney function. . Plan echocardiogram.  Coronary artery disease without angina pectoris with status post angioplasty and stenting:  EKG reviewed.  Echocardiogram will be ordered to evaluate for structural heart disease and left ventricular systolic function.  Continue aspirin  Continue beta-blocker therapy.  Continue losartan.  Given the new onset of atrial flutter and history of coronary artery disease with anterior STEMI consider nuclear stress test the next office visit.  History of anterior ST segment elevation MI: See above  Mixed hyperlipidemia: Continue statin therapy.  Cholesterol currently being managed by her primary care provider.  Patient will have a repeat lipid profile with her PCP at her upcoming physical appointment.  Orders Placed This Encounter  Procedures  . Basic Metabolic Panel (BMET)  . Magnesium  . EKG 12-Lead  . PCV ECHOCARDIOGRAM COMPLETE   --Continue cardiac medications as reconciled in final medication list. --Return in about 2 weeks (around 07/30/2019) for Follow up [place] (AFL and disccus [place] +/- DDCV. after echo . Or sooner if needed. --Continue follow-up with your primary care physician  regarding the management of your other chronic comorbid conditions.  Patient's questions and concerns were addressed to her satisfaction. She voices understanding of the instructions provided during this encounter.   This note was created using a voice recognition software as a result there may be grammatical errors inadvertently enclosed that do not reflect the nature of this encounter. Every attempt is made to correct such errors.  Rex Kras, [place], [facility]  Pager: [phone] Office: [phone]

## 2019-07-17 LAB — BASIC METABOLIC PANEL
BUN/Creatinine Ratio: 23 (ref 12–28)
BUN: 28 mg/dL — ABNORMAL HIGH (ref 8–27)
CO2: 23 mmol/L (ref 20–29)
Calcium: 9.7 mg/dL (ref 8.7–10.3)
Chloride: 104 mmol/L (ref 96–106)
Creatinine, Ser: 1.22 mg/dL — ABNORMAL HIGH (ref 0.57–1.00)
GFR calc Af Amer: 53 mL/min/{1.73_m2} — ABNORMAL LOW (ref >59)
GFR calc non Af Amer: 46 mL/min/{1.73_m2} — ABNORMAL LOW (ref >59)
Glucose: 82 mg/dL (ref 65–99)
Potassium: 4.6 mmol/L (ref 3.5–5.2)
Sodium: 140 mmol/L (ref 134–144)

## 2019-07-17 LAB — MAGNESIUM: Magnesium: 2.3 mg/dL (ref 1.6–2.3)

## 2019-07-17 NOTE — Progress Notes (Signed)
You may have reviewed this already

## 2019-07-20 NOTE — Telephone Encounter (Signed)
Left voicemail for patient to call back. 

## 2019-07-20 NOTE — Telephone Encounter (Signed)
-----   Message from [place], [place] sent at 07/18/2019 11:39 AM EDT ----- Kidney function is elevated but compared to a year ago it is stable.  Ask if she has considered starting blood thinners for atrial flutter.  Electrolytes are within normal limits.   ST

## 2019-07-21 ENCOUNTER — Telehealth: Payer: Self-pay

## 2019-07-21 ENCOUNTER — Other Ambulatory Visit: Payer: Self-pay | Admitting: Cardiology

## 2019-07-21 NOTE — Telephone Encounter (Signed)
Relayed test results to patient. Patient voiced understanding. Patient states she was previously on Eliquis "about a year ago" but it was discontinued by a Provider. Patient is willing to try blood thinners again. Please advise. Thanks!

## 2019-07-22 ENCOUNTER — Other Ambulatory Visit: Payer: Self-pay | Admitting: Cardiology

## 2019-07-22 DIAGNOSIS — I484 Atypical atrial flutter: Secondary | ICD-10-CM

## 2019-07-22 MED ORDER — APIXABAN 5 MG PO TABS: 5.0000 mg | ORAL_TABLET | Freq: Two times a day (BID) | ORAL | 0 refills | Status: DC

## 2019-07-22 NOTE — Progress Notes (Unsigned)
Patient did call the office back if she is interested in starting oral anticoagulation.  She was on Eliquis in the past and tolerated the medication well.  Patient is already aware of the risks, benefits, and alternatives to oral anticoagulation.  Patient's age is 74, weight is less than 60 kg, and recent creatinine is 1.22.  We will start Eliquis 5 mg p.o. twice daily.  Prescription sent.  [name], [place], [facility]  Pager: [phone] Office: [phone]

## 2019-07-22 NOTE — Telephone Encounter (Signed)
I did send a prescription for Eliquis 5 mg p.o. twice daily to her pharmacy.  Please inform the patient.

## 2019-07-22 NOTE — Telephone Encounter (Signed)
Spoke with patient, patient voiced understanding.

## 2019-07-24 ENCOUNTER — Other Ambulatory Visit: Payer: Self-pay

## 2019-07-24 ENCOUNTER — Ambulatory Visit: Payer: Medicare Other

## 2019-07-24 DIAGNOSIS — I484 Atypical atrial flutter: Secondary | ICD-10-CM

## 2019-07-24 DIAGNOSIS — I2510 Atherosclerotic heart disease of native coronary artery without angina pectoris: Secondary | ICD-10-CM

## 2019-07-27 ENCOUNTER — Other Ambulatory Visit: Payer: Self-pay | Admitting: Cardiology

## 2019-07-30 ENCOUNTER — Ambulatory Visit: Payer: BC Managed Care – PPO | Admitting: Cardiology

## 2019-08-03 ENCOUNTER — Telehealth: Payer: Self-pay

## 2019-08-03 NOTE — Telephone Encounter (Signed)
Spoke with patient. Patient voiced understanding.

## 2019-08-03 NOTE — Telephone Encounter (Signed)
-----   Message from [place], [place] sent at 08/02/2019 11:20 PM EDT ----- The left ventricular ejection fraction or pumping activity of the heart is within the normal limit. Details will be reviewed at the next office visit.

## 2019-08-05 ENCOUNTER — Ambulatory Visit: Payer: Medicare Other | Admitting: Cardiology

## 2019-08-05 ENCOUNTER — Other Ambulatory Visit: Payer: Self-pay

## 2019-08-05 ENCOUNTER — Encounter: Payer: Self-pay | Admitting: Cardiology

## 2019-08-05 ENCOUNTER — Other Ambulatory Visit: Payer: Self-pay | Admitting: Cardiology

## 2019-08-05 VITALS — BP 126/59 | HR 54 | Temp 97.6°F | Resp 16 | Ht 63.0 in | Wt 130.0 lb

## 2019-08-05 DIAGNOSIS — E782 Mixed hyperlipidemia: Secondary | ICD-10-CM | POA: Diagnosis not present

## 2019-08-05 DIAGNOSIS — Z955 Presence of coronary angioplasty implant and graft: Secondary | ICD-10-CM

## 2019-08-05 DIAGNOSIS — R252 Cramp and spasm: Secondary | ICD-10-CM

## 2019-08-05 DIAGNOSIS — I10 Essential (primary) hypertension: Secondary | ICD-10-CM | POA: Diagnosis not present

## 2019-08-05 DIAGNOSIS — I252 Old myocardial infarction: Secondary | ICD-10-CM

## 2019-08-05 DIAGNOSIS — I4892 Unspecified atrial flutter: Secondary | ICD-10-CM | POA: Diagnosis not present

## 2019-08-05 DIAGNOSIS — Z7901 Long term (current) use of anticoagulants: Secondary | ICD-10-CM

## 2019-08-05 DIAGNOSIS — I25118 Atherosclerotic heart disease of native coronary artery with other forms of angina pectoris: Secondary | ICD-10-CM | POA: Diagnosis not present

## 2019-08-05 NOTE — Progress Notes (Signed)
Suzanne Hunt Date of Birth: April 29, 1950 MRN: [id number] Primary Care Provider:Sun, Gari Crown, MD  Former Cardiologist: Dr. Adrian Prows and Jeri Lager, APRN, FNP-C Primary Cardiologist: Rex Kras, DO (established care 07/16/2019)  Date: 08/05/19 Last Office Visit: 07/16/2019  No chief complaint on file.  HPI  Suzanne Hunt is a 69 y.o.  female who presents to the office with a chief complaint of " muscle aches and follow-up for atrial flutter" Patient's past medical history and cardiovascular risk factors include: Hypertension, hyperlipidemia, history of anterior STEMI status post PCI to the mid/distal LAD with balloon angioplasty, history of atrial fibrillation, paroxysmal atrial flutter (EKG 07/16/2019), postmenopausal female, advanced age.    I saw the patient for the first time in April 2021 to reestablish care.  At that time she came in for routine follow-up given her underlying coronary artery disease.  EKG at that time noted atrial fibrillation and she was recommended to be on oral anticoagulation.  During the office visit patient states that she wanted time to think about it but did call back in the interim and was started on oral anticoagulation for thromboembolic prophylaxis given her underlying atrial flutter.  This was a 2-week follow-up to reevaluate her underlying rhythm and to see if she is a candidate for direct-current cardioversion and how she is tolerating the oral anticoagulation.  Patient states that she is tolerating Eliquis well without any side effects or intolerances.  She does not endorse any evidence of bleeding.  Today's EKG illustrates sinus bradycardia.   Muscle aches: Patient states that she started noticing muscle ache for the last couple weeks.  Patient states that her muscle aches were so significant last Thursday as she was about to go to the hospital.  She woke up in the middle of the night took Aleve and the pain did improve and did not go to the ER.   She goes to the gym every day and does strength training of her arms Monday Wednesday Friday and like Thursday and Tuesdays.  She is also on rosuvastatin 20 mg p.o. nightly for the management of hyperlipidemia.  Upon furthering questioning patient states that it is not secondary to working out as she has been doing it for last couple months.  I have asked her to keep her self well-hydrated.    Paroxysmal atrial flutter: EKG prior to today's office visit shows sinus mechanism.  According to electronic medical records she does have history of coarse atrial fibrillation in the past with a controlled ventricular rate.  She was on oral anticoagulation with Eliquis for short period of time.  She spontaneously converted to normal sinus rhythm and maintained sinus rhythm and on office visit 3 months later it was discontinued.  Since initiation of Eliquis patient has done well and does not endorse any evidence of bleeding.  No recent falls.  No prior history of intracranial or internal bleeding.  Discussed risks, benefits, and alternatives to oral anticoagulation.  History of myocardial infarction (anterior STEMI), coronary artery disease with PCI to the mid/distal LAD. Denies prior history of congestive heart failure, deep venous thrombosis, pulmonary embolism, stroke, transient ischemic attack.  FUNCTIONAL STATUS: She walks 6 days a week at least 3 to 4 miles each time.  And does resistance training 5 days a week.  ALLERGIES: Allergies  Allergen Reactions  . Actinel [Pseudoephedrine-Dm-Gg] Other (See Comments)    Flu like symptoms   MEDICATION LIST PRIOR TO VISIT: Current Outpatient Medications on File Prior to Visit  Medication Sig Dispense Refill  . apixaban (ELIQUIS) 5 MG TABS tablet Take 1 tablet (5 mg total) by mouth 2 (two) times daily. 180 tablet 0  . aspirin EC 81 MG tablet Take 1 tablet (81 mg total) by mouth daily. 90 tablet 3  . Calcium Carb-Cholecalciferol (CALCIUM + VITAMIN D3 PO) Take 1  tablet by mouth 2 (two) times daily.    Marland Kitchen ibandronate (BONIVA) 150 MG tablet Take 150 mg by mouth every 30 (thirty) days. Take in the morning with a full glass of water, on an empty stomach, and do not take anything else by mouth or lie down for the next 30 min. Takes the first week of the month.    . losartan (COZAAR) 25 MG tablet TAKE 1 TABLET(25 MG) BY MOUTH EVERY EVENING 90 tablet 0  . metoprolol tartrate (LOPRESSOR) 25 MG tablet TAKE 1/2 TABLET(12.5 MG) BY MOUTH TWICE DAILY 60 tablet 2  . Multiple Vitamin (MULTIVITAMIN WITH MINERALS) TABS Take 1 tablet by mouth 2 (two) times daily.     . nitroGLYCERIN (NITROSTAT) 0.4 MG SL tablet Place 1 tablet (0.4 mg total) under the tongue every 5 (five) minutes x 3 doses as needed for chest pain. 30 tablet 6  . Polyvinyl Alcohol-Povidone PF (REFRESH) 1.4-0.6 % SOLN Place 1 drop into both eyes daily as needed (for dry eyes).    . rosuvastatin (CRESTOR) 20 MG tablet TAKE 1 TABLET(20 MG) BY MOUTH DAILY AT 6 PM 90 tablet 0   No current facility-administered medications on file prior to visit.    PAST MEDICAL HISTORY: Past Medical History:  Diagnosis Date  . CAD (coronary artery disease) 01/07/2019  . Coronary artery disease   . HTN (hypertension) 01/07/2019  . Hypertension   . Medical history non-contributory   . Myocardial infarction [facility])     PAST SURGICAL HISTORY: Past Surgical History:  Procedure Laterality Date  . CORONARY/GRAFT ACUTE MI REVASCULARIZATION N/A 12/08/2017   Procedure: Coronary/Graft Acute MI Revascularization;  Surgeon: Adrian Prows, MD;  Location: [facility];  Service: Cardiovascular;  Laterality: N/A;  . LEFT HEART CATH AND CORONARY ANGIOGRAPHY N/A 12/08/2017   Procedure: LEFT HEART CATH AND CORONARY ANGIOGRAPHY;  Surgeon: Adrian Prows, MD;  Location: [facility];  Service: Cardiovascular;  Laterality: N/A;  . TEE WITHOUT CARDIOVERSION N/A 03/25/2018   Procedure: TRANSESOPHAGEAL ECHOCARDIOGRAM (TEE);  Surgeon: Adrian Prows, MD;  Location: [facility];  Service: Cardiovascular;  Laterality: N/A;    FAMILY HISTORY: The patient's family history includes Breast cancer (age of onset: 83) in her paternal aunt; Breast cancer (age of onset: 8) in her sister; Diabetes in her father and sister; Heart disease in her mother and sister.   SOCIAL HISTORY:  The patient  reports that she has never smoked. She has never used smokeless tobacco. She reports that she does not drink alcohol or use drugs.  Review of Systems  Constitution: Negative for chills and fever.  HENT: Negative for ear discharge, ear pain and nosebleeds.   Eyes: Negative for blurred vision and discharge.  Cardiovascular: Negative for chest pain, claudication, dyspnea on exertion, leg swelling, near-syncope, orthopnea, palpitations, paroxysmal nocturnal dyspnea and syncope.  Respiratory: Negative for cough and shortness of breath.   Endocrine: Negative for polydipsia, polyphagia and polyuria.  Hematologic/Lymphatic: Negative for bleeding problem.  Skin: Negative for flushing and nail changes.  Musculoskeletal: Positive for muscle cramps and myalgias.  Gastrointestinal: Negative for abdominal pain, dysphagia, hematemesis, hematochezia, melena, nausea and vomiting.  Neurological: Negative  for dizziness, focal weakness and light-headedness.    PHYSICAL EXAM: Vitals with BMI 08/05/2019 07/16/2019 01/07/2019  Height _0  _1  _2   Weight 130 lbs 129 lbs 131 lbs  BMI 23.03 91.63 84.66  Systolic 599 357 017  Diastolic 59 75 81  Pulse 54 65 62   CONSTITUTIONAL: Well-developed and well-nourished. No acute distress.  SKIN: Skin is warm and dry. No rash noted. No cyanosis. No pallor. No jaundice HEAD: Normocephalic and atraumatic.  EYES: No scleral icterus MOUTH/THROAT: Moist oral membranes.  NECK: No JVD present. No thyromegaly noted. No carotid bruits  LYMPHATIC: No visible cervical adenopathy.  CHEST Normal respiratory effort. No intercostal  retractions  LUNGS: Clear to auscultation bilaterally.  No stridor. No wheezes. No rales.  CARDIOVASCULAR: Irregularly irregular, positive B9-T9, soft holosystolic murmur heard at the apex, no gallops or rubs. ABDOMINAL: Nonobese, soft, nontender, nondistended, positive bowel sounds in all 4 quadrants.  No apparent ascites.  EXTREMITIES: No peripheral edema  HEMATOLOGIC: No significant bruising NEUROLOGIC: Oriented to person, place, and time. Nonfocal. Normal muscle tone.  PSYCHIATRIC: Normal mood and affect. Normal behavior. Cooperative  CARDIAC DATABASE: EKG: 12/18/2017: Coarse atrial fibrillation with controlled ventricular response at the rate of 77 bpm, normal axis, anterolateral infarct old with persistent deep T wave inversion in V1 to V6. Abnormal EKG. 01/07/2019: Sinus bradycardia 55 bpm with 1 PAC, left atrial abnormality, normal axis, anteroseptal infarct old.  Diffuse nonspecific T wave flattening. No changes compared to previous EKG.  07/16/2019:Atrial flutter, controlled ventricular rate, normal axis deviation no underlying injury pattern. 08/05/2019:Sinus bradycardia, 55 bpm, normal axis, old anteroseptal infarct, low voltage in the precordial leads, no underlying injury pattern.  Echocardiogram: TEE 03/25/2018: Normal LV systolic function, trileaflet aortic valve. There is mild coaptation of the non-and left coronary cusp at the base leading to eccentric mild aortic valve regurgitation. No vegetation. Otherwise no significant abnormalities identified. Compared to the TTE 12/09/2017, EF is improved from 35-40%.  Transthoracic echo 01/28/2018: LVEF 60%, biatrial dilatation mild AR, mild to moderate MR, mild MAC, mild to moderate MR, mild to moderate TR, RVSP 33 mmHg.  Stress Testing:  None  Heart Catheterization: 12/08/2017:  Dist RCA lesion is 30% stenosed.  Mid LAD to Dist LAD lesion is 100% stenosed. Balloon angioplasty was performed using a BALLOON SAPPHIRE 2.0X12.  Post  intervention, there is a 10% residual stenosis at the apical LAD. Excellent blush and timi 3 flow was present.  Prox LAD lesion is 30% stenosed.  LV end diastolic pressure is normal.  There is mild left ventricular systolic dysfunction.  The left ventricular ejection fraction is 45-50% by visual estimate.  Balloon angioplasty was performed using a BALLOON SAPPHIRE 2.0X12.   LABORATORY DATA: CBC Latest Ref Rng & Units 12/09/2017 12/08/2017 12/08/2017  WBC 4.0 - 10.5 K/uL 5.8 5.9 -  Hemoglobin 12.0 - 15.0 g/dL 11.9(L) 12.3 13.3  Hematocrit 36.0 - 46.0 % 37.3 38.4 39.0  Platelets 150 - 400 K/uL 179 185 -    CMP Latest Ref Rng & Units 07/16/2019 12/09/2017 12/09/2017  Glucose 65 - 99 mg/dL 82 92 117(H)  BUN 8 - 27 mg/dL 28(H) 22 17  Creatinine 0.57 - 1.00 mg/dL 1.22(H) 1.24(H) 0.88  Sodium 134 - 144 mmol/L 140 138 135  Potassium 3.5 - 5.2 mmol/L 4.6 4.1 4.2  Chloride 96 - 106 mmol/L 104 108 110  CO2 20 - 29 mmol/L 23 20(L) 21(L)  Calcium 8.7 - 10.3 mg/dL 9.7 8.7(L) 8.1(L)  Total Protein 6.5 -  8.1 g/dL - - -  Total Bilirubin 0.3 - 1.2 mg/dL - - -  Alkaline Phos 38 - 126 U/L - - -  AST 15 - 41 U/L - - -  ALT 0 - 44 U/L - - -    Lipid Panel     Component Value Date/Time   CHOL 178 12/09/2017 0616   TRIG 34 12/09/2017 0616   HDL 58 12/09/2017 0616   CHOLHDL 3.1 12/09/2017 0616   VLDL 7 12/09/2017 0616   LDLCALC 113 (H) 12/09/2017 0616    Lab Results  Component Value Date   HGBA1C 4.9 12/08/2017   No components found for: NTPROBNP Lab Results  Component Value Date   TSH 3.796 12/09/2017   FINAL MEDICATION LIST END OF ENCOUNTER: No orders of the defined types were placed in this encounter.   There are no discontinued medications.   Current Outpatient Medications:  .  apixaban (ELIQUIS) 5 MG TABS tablet, Take 1 tablet (5 mg total) by mouth 2 (two) times daily., Disp: 180 tablet, Rfl: 0 .  aspirin EC 81 MG tablet, Take 1 tablet (81 mg total) by mouth daily., Disp: 90  tablet, Rfl: 3 .  Calcium Carb-Cholecalciferol (CALCIUM + VITAMIN D3 PO), Take 1 tablet by mouth 2 (two) times daily., Disp: , Rfl:  .  ibandronate (BONIVA) 150 MG tablet, Take 150 mg by mouth every 30 (thirty) days. Take in the morning with a full glass of water, on an empty stomach, and do not take anything else by mouth or lie down for the next 30 min. Takes the first week of the month., Disp: , Rfl:  .  losartan (COZAAR) 25 MG tablet, TAKE 1 TABLET(25 MG) BY MOUTH EVERY EVENING, Disp: 90 tablet, Rfl: 0 .  metoprolol tartrate (LOPRESSOR) 25 MG tablet, TAKE 1/2 TABLET(12.5 MG) BY MOUTH TWICE DAILY, Disp: 60 tablet, Rfl: 2 .  Multiple Vitamin (MULTIVITAMIN WITH MINERALS) TABS, Take 1 tablet by mouth 2 (two) times daily. , Disp: , Rfl:  .  nitroGLYCERIN (NITROSTAT) 0.4 MG SL tablet, Place 1 tablet (0.4 mg total) under the tongue every 5 (five) minutes x 3 doses as needed for chest pain., Disp: 30 tablet, Rfl: 6 .  Polyvinyl Alcohol-Povidone PF (REFRESH) 1.4-0.6 % SOLN, Place 1 drop into both eyes daily as needed (for dry eyes)., Disp: , Rfl:  .  rosuvastatin (CRESTOR) 20 MG tablet, TAKE 1 TABLET(20 MG) BY MOUTH DAILY AT 6 PM, Disp: 90 tablet, Rfl: 0  IMPRESSION:    ICD-10-CM   1. Paroxysmal atrial flutter (HCC)  I48.92   2. Long term (current) use of anticoagulants  Z79.01 CBC  3. Mixed hyperlipidemia  E78.2 Lipid Panel With LDL/HDL Ratio  4. Muscle cramp  R25.2 CMP14+EGFR    CK (Creatine Kinase)  5. Hx of ST elevation myocardial infarction  I25.2   6. History of coronary angioplasty with insertion of stent  Z95.5   7. Essential hypertension  I10 EKG 12-Lead  8. Coronary artery disease of native artery of native heart with stable angina pectoris [facility])  I25.118      RECOMMENDATIONS: INDIA JOLIN is a 69 y.o. female whose past medical history and cardiovascular risk factors include: Hypertension, hyperlipidemia, history of anterior STEMI status post PCI to the mid/distal LAD with  balloon angioplasty, history of atrial fibrillation, postmenopausal female, advanced age.  Paroxysmal atrial flutter:  . Rate control: Metoprolol. Marland Kitchen Rhythm control: N/A. Marland Kitchen Thromboembolic prophylaxis: Eliquis  . EKG in the office shows underlying  rhythm to be sinus bradycardia.  This was discussed and reviewed with the patient.   . CHA2DS2-VASc SCORE is 4 which correlates to 4 % risk of stroke per year.  . Echocardiogram results reviewed with the patient at today's office visit.  Long-term oral anticoagulation:  Indication paroxysmal atrial flutter.  Patient does not endorse any evidence of bleeding.  Check CBC to evaluate hemoglobin.  Coronary artery disease without angina pectoris with status post angioplasty and stenting:  EKG reviewed.  Echocardiogram will be ordered to evaluate for structural heart disease and left ventricular systolic function.  Continue aspirin  Continue beta-blocker therapy.  Continue losartan.  History of anterior ST segment elevation MI: See above  Mixed hyperlipidemia: Continue statin therapy.  Cholesterol currently being managed by her primary care provider. Given her underlying myalgias check CMP to evaluate liver function, CK levels, and repeat lipid profile.  Orders Placed This Encounter  Procedures  . CMP14+EGFR  . CK (Creatine Kinase)  . CBC  . Lipid Panel With LDL/HDL Ratio  . EKG 12-Lead   --Continue cardiac medications as reconciled in final medication list. --Return in about 6 months (around 02/04/2020) for re-evaluation of symptoms.. Or sooner if needed. --Continue follow-up with your primary care physician regarding the management of your other chronic comorbid conditions.  Patient's questions and concerns were addressed to her satisfaction. She voices understanding of the instructions provided during this encounter.   This note was created using a voice recognition software as a result there may be grammatical errors  inadvertently enclosed that do not reflect the nature of this encounter. Every attempt is made to correct such errors.  Rex Kras, [place], [facility]  Pager: [phone] Office: [phone]

## 2019-08-10 DIAGNOSIS — E782 Mixed hyperlipidemia: Secondary | ICD-10-CM | POA: Diagnosis not present

## 2019-08-10 DIAGNOSIS — R252 Cramp and spasm: Secondary | ICD-10-CM | POA: Diagnosis not present

## 2019-08-10 DIAGNOSIS — Z7901 Long term (current) use of anticoagulants: Secondary | ICD-10-CM | POA: Diagnosis not present

## 2019-08-11 LAB — CBC
Hematocrit: 38.7 % (ref 34.0–46.6)
Hemoglobin: 12.7 g/dL (ref 11.1–15.9)
MCH: 31.2 pg (ref 26.6–33.0)
MCHC: 32.8 g/dL (ref 31.5–35.7)
MCV: 95 fL (ref 79–97)
Platelets: 151 10*3/uL (ref 150–450)
RBC: 4.07 x10E6/uL (ref 3.77–5.28)
RDW: 12.2 % (ref 11.7–15.4)
WBC: 3.3 10*3/uL — ABNORMAL LOW (ref 3.4–10.8)

## 2019-08-11 LAB — CMP14+EGFR
ALT: 40 IU/L — ABNORMAL HIGH (ref 0–32)
AST: 41 IU/L — ABNORMAL HIGH (ref 0–40)
Albumin/Globulin Ratio: 1.6 (ref 1.2–2.2)
Albumin: 3.8 g/dL (ref 3.8–4.8)
Alkaline Phosphatase: 136 IU/L — ABNORMAL HIGH (ref 39–117)
BUN/Creatinine Ratio: 21 (ref 12–28)
BUN: 27 mg/dL (ref 8–27)
Bilirubin Total: 0.7 mg/dL (ref 0.0–1.2)
CO2: 24 mmol/L (ref 20–29)
Calcium: 9.2 mg/dL (ref 8.7–10.3)
Chloride: 104 mmol/L (ref 96–106)
Creatinine, Ser: 1.27 mg/dL — ABNORMAL HIGH (ref 0.57–1.00)
GFR calc Af Amer: 50 mL/min/{1.73_m2} — ABNORMAL LOW (ref >59)
GFR calc non Af Amer: 44 mL/min/{1.73_m2} — ABNORMAL LOW (ref >59)
Globulin, Total: 2.4 g/dL (ref 1.5–4.5)
Glucose: 79 mg/dL (ref 65–99)
Potassium: 4.4 mmol/L (ref 3.5–5.2)
Sodium: 140 mmol/L (ref 134–144)
Total Protein: 6.2 g/dL (ref 6.0–8.5)

## 2019-08-11 LAB — LIPID PANEL WITH LDL/HDL RATIO
Cholesterol, Total: 136 mg/dL (ref 100–199)
HDL: 43 mg/dL (ref >39)
LDL Chol Calc (NIH): 84 mg/dL (ref 0–99)
LDL/HDL Ratio: 2 ratio (ref 0.0–3.2)
Triglycerides: 36 mg/dL (ref 0–149)
VLDL Cholesterol Cal: 9 mg/dL (ref 5–40)

## 2019-08-11 LAB — CK: Total CK: 195 U/L — ABNORMAL HIGH (ref 32–182)

## 2019-08-13 ENCOUNTER — Other Ambulatory Visit: Payer: Self-pay | Admitting: Cardiology

## 2019-08-13 DIAGNOSIS — R252 Cramp and spasm: Secondary | ICD-10-CM

## 2019-08-14 ENCOUNTER — Telehealth: Payer: Self-pay

## 2019-08-14 NOTE — Telephone Encounter (Signed)
Spoke to the patient and questions were addressed.

## 2019-08-14 NOTE — Telephone Encounter (Signed)
-----   Message from [place], [place] sent at 08/13/2019  6:41 PM EDT ----- Please have her hold the Crestor given her blood work. Please encourage her to repeat her blood work in 2 weeks after stopping Crestor to make sure the blood work has improved. Also have her increase her water intake by 2 to 4 glasses/day. Lab work already sent to [facility] and released.

## 2019-08-14 NOTE — Telephone Encounter (Signed)
Relayed information to patient in regards to her recent lab work, however patient is still confused about her Cholesterol. Patient would like to know more details about her parameters and has some questions about her lab work in general. Patient requested that you give her a quick call to answer some questions that she has about this? [phone]. Thank you!

## 2019-08-17 ENCOUNTER — Telehealth: Payer: Self-pay

## 2019-08-17 NOTE — Telephone Encounter (Signed)
-----   Message from [place], [place] sent at 08/13/2019  6:41 PM EDT ----- I called the patient this evening to review the lab work but she was unavailable left a voicemail.

## 2019-08-17 NOTE — Telephone Encounter (Signed)
Lm concerning lab results, informing patient that Dr. Odis Hollingshead had also left a message for her and that she can call back with any questions or concerns.

## 2019-08-31 DIAGNOSIS — R252 Cramp and spasm: Secondary | ICD-10-CM | POA: Diagnosis not present

## 2019-09-01 LAB — CMP14+EGFR
ALT: 66 IU/L — ABNORMAL HIGH (ref 0–32)
AST: 49 IU/L — ABNORMAL HIGH (ref 0–40)
Albumin/Globulin Ratio: 1.7 (ref 1.2–2.2)
Albumin: 3.7 g/dL — ABNORMAL LOW (ref 3.8–4.8)
Alkaline Phosphatase: 145 IU/L — ABNORMAL HIGH (ref 48–121)
BUN/Creatinine Ratio: 21 (ref 12–28)
BUN: 24 mg/dL (ref 8–27)
Bilirubin Total: 0.7 mg/dL (ref 0.0–1.2)
CO2: 21 mmol/L (ref 20–29)
Calcium: 9 mg/dL (ref 8.7–10.3)
Chloride: 107 mmol/L — ABNORMAL HIGH (ref 96–106)
Creatinine, Ser: 1.14 mg/dL — ABNORMAL HIGH (ref 0.57–1.00)
GFR calc Af Amer: 57 mL/min/{1.73_m2} — ABNORMAL LOW (ref >59)
GFR calc non Af Amer: 50 mL/min/{1.73_m2} — ABNORMAL LOW (ref >59)
Globulin, Total: 2.2 g/dL (ref 1.5–4.5)
Glucose: 81 mg/dL (ref 65–99)
Potassium: 4.5 mmol/L (ref 3.5–5.2)
Sodium: 141 mmol/L (ref 134–144)
Total Protein: 5.9 g/dL — ABNORMAL LOW (ref 6.0–8.5)

## 2019-09-01 LAB — CK: Total CK: 296 U/L — ABNORMAL HIGH (ref 32–182)

## 2019-09-01 NOTE — Progress Notes (Signed)
Called patient, NA, LMAM to call back  for lab results.

## 2019-09-02 ENCOUNTER — Telehealth: Payer: Self-pay

## 2019-09-02 NOTE — Telephone Encounter (Signed)
Spoke with patient regarding lab results and increased kidney function. Patient voiced understanding however is unsure is she needs to go back on Crestor or discontinue now that the lab work is complete. (Patient stated she did hold Crestor for two weeks prior) Patient would also like a more elaborated explanation about her kidney function. ie: what caused it, how long she's had it, etc. Please advise. Thanks!

## 2019-09-02 NOTE — Telephone Encounter (Signed)
The last message I sent said the kidney function has "improved" compared to before.   For now hold Crestor until her liver function and CK levels improves. She needs to see her primary care to see if there is another cause of elevated liver test.

## 2019-09-03 NOTE — Telephone Encounter (Signed)
When speaking to the patient I did state that her kidney function improved. Previous note was made in error. Will follow up with patient in regards to speaking with her PCP about a detailed explanation in regards to her liver.

## 2019-09-04 NOTE — Progress Notes (Signed)
2nd attempt : NA, LMAM to call back about lab results.

## 2019-09-22 NOTE — Telephone Encounter (Signed)
Please follow up if she has seen her PCP regarding elevated CK levels?

## 2019-09-22 NOTE — Telephone Encounter (Signed)
Left vm to cb.

## 2019-09-23 ENCOUNTER — Other Ambulatory Visit: Payer: Self-pay

## 2019-09-23 MED ORDER — ATORVASTATIN CALCIUM 40 MG PO TABS: 40.0000 mg | ORAL_TABLET | Freq: Every day | ORAL | 1 refills | Status: DC

## 2019-09-23 NOTE — Telephone Encounter (Signed)
Received a cb from patient in regards to CK levels. Informed patient that they needed to follow up with PCP as soon as possible. Copy of labs have been faxed to PCP.

## 2019-10-07 ENCOUNTER — Other Ambulatory Visit: Payer: Self-pay | Admitting: Family Medicine

## 2019-10-07 DIAGNOSIS — Z1231 Encounter for screening mammogram for malignant neoplasm of breast: Secondary | ICD-10-CM

## 2019-10-22 ENCOUNTER — Other Ambulatory Visit: Payer: Self-pay | Admitting: Cardiology

## 2019-10-22 ENCOUNTER — Other Ambulatory Visit: Payer: Self-pay

## 2019-10-22 DIAGNOSIS — I484 Atypical atrial flutter: Secondary | ICD-10-CM

## 2019-10-22 MED ORDER — APIXABAN 5 MG PO TABS: ORAL_TABLET | ORAL | 0 refills | Status: DC

## 2019-11-17 ENCOUNTER — Ambulatory Visit
Admission: RE | Admit: 2019-11-17 | Discharge: 2019-11-17 | Disposition: A | Discharge status: Home / Self Care | Payer: BC Managed Care – PPO | Source: Ambulatory Visit | Attending: Family Medicine | Admitting: Family Medicine

## 2019-11-17 ENCOUNTER — Other Ambulatory Visit: Payer: Self-pay

## 2019-11-17 DIAGNOSIS — Z1231 Encounter for screening mammogram for malignant neoplasm of breast: Secondary | ICD-10-CM

## 2019-11-23 ENCOUNTER — Other Ambulatory Visit: Payer: Self-pay | Admitting: Cardiology

## 2019-12-11 ENCOUNTER — Other Ambulatory Visit: Payer: Self-pay

## 2019-12-11 MED ORDER — METOPROLOL TARTRATE 25 MG PO TABS: ORAL_TABLET | ORAL | 2 refills | Status: DC

## 2019-12-11 MED ORDER — ROSUVASTATIN CALCIUM 20 MG PO TABS: ORAL_TABLET | ORAL | 0 refills | Status: DC

## 2020-01-11 ENCOUNTER — Other Ambulatory Visit: Payer: Self-pay | Admitting: Cardiology

## 2020-01-11 DIAGNOSIS — I484 Atypical atrial flutter: Secondary | ICD-10-CM

## 2020-01-12 ENCOUNTER — Other Ambulatory Visit: Payer: Self-pay

## 2020-01-12 MED ORDER — LOSARTAN POTASSIUM 25 MG PO TABS: ORAL_TABLET | ORAL | 2 refills | Status: DC

## 2020-01-20 ENCOUNTER — Telehealth: Payer: Self-pay

## 2020-01-20 NOTE — Telephone Encounter (Signed)
patient canceled her appointment with you on 02/10/20 because she cant get an appoinment with you after 3:30 until Nov. She was really rude and really upset

## 2020-02-10 ENCOUNTER — Ambulatory Visit: Payer: BC Managed Care – PPO | Admitting: Cardiology

## 2020-02-22 ENCOUNTER — Other Ambulatory Visit: Payer: Self-pay | Admitting: Cardiology

## 2020-03-16 ENCOUNTER — Other Ambulatory Visit: Payer: Self-pay

## 2020-03-16 MED ORDER — NITROGLYCERIN 0.4 MG SL SUBL: 0.4000 mg | SUBLINGUAL_TABLET | SUBLINGUAL | 6 refills | Status: DC | PRN

## 2020-04-21 DIAGNOSIS — H2513 Age-related nuclear cataract, bilateral: Secondary | ICD-10-CM | POA: Diagnosis not present

## 2020-04-21 DIAGNOSIS — H25013 Cortical age-related cataract, bilateral: Secondary | ICD-10-CM | POA: Diagnosis not present

## 2020-04-21 DIAGNOSIS — H04123 Dry eye syndrome of bilateral lacrimal glands: Secondary | ICD-10-CM | POA: Diagnosis not present

## 2020-04-21 DIAGNOSIS — H40013 Open angle with borderline findings, low risk, bilateral: Secondary | ICD-10-CM | POA: Diagnosis not present

## 2020-05-10 ENCOUNTER — Other Ambulatory Visit: Payer: Self-pay

## 2020-05-10 DIAGNOSIS — I484 Atypical atrial flutter: Secondary | ICD-10-CM

## 2020-05-10 MED ORDER — APIXABAN 5 MG PO TABS: 5.0000 mg | ORAL_TABLET | Freq: Two times a day (BID) | ORAL | 0 refills | Status: DC

## 2020-05-15 ENCOUNTER — Other Ambulatory Visit: Payer: Self-pay | Admitting: Cardiology

## 2020-05-16 ENCOUNTER — Other Ambulatory Visit: Payer: Self-pay | Admitting: Cardiology

## 2020-05-20 ENCOUNTER — Other Ambulatory Visit: Payer: Self-pay

## 2020-05-20 MED ORDER — ROSUVASTATIN CALCIUM 20 MG PO TABS
ORAL_TABLET | ORAL | 0 refills | Status: DC
Start: 2020-05-20 — End: 2020-06-02

## 2020-05-20 MED ORDER — LOSARTAN POTASSIUM 25 MG PO TABS
25.0000 mg | ORAL_TABLET | Freq: Every day | ORAL | 0 refills | Status: DC
Start: 2020-05-20 — End: 2020-09-20

## 2020-05-23 ENCOUNTER — Other Ambulatory Visit: Payer: Self-pay | Admitting: Cardiology

## 2020-06-02 ENCOUNTER — Other Ambulatory Visit: Payer: Self-pay

## 2020-06-02 ENCOUNTER — Ambulatory Visit: Payer: Medicare Other | Admitting: Cardiology

## 2020-06-02 ENCOUNTER — Encounter: Payer: Self-pay | Admitting: Cardiology

## 2020-06-02 VITALS — BP 139/74 | HR 71 | Temp 98.0°F | Resp 16 | Ht 63.0 in | Wt 137.0 lb

## 2020-06-02 DIAGNOSIS — I25118 Atherosclerotic heart disease of native coronary artery with other forms of angina pectoris: Secondary | ICD-10-CM

## 2020-06-02 DIAGNOSIS — Z955 Presence of coronary angioplasty implant and graft: Secondary | ICD-10-CM

## 2020-06-02 DIAGNOSIS — I10 Essential (primary) hypertension: Secondary | ICD-10-CM

## 2020-06-02 DIAGNOSIS — E782 Mixed hyperlipidemia: Secondary | ICD-10-CM

## 2020-06-02 DIAGNOSIS — I252 Old myocardial infarction: Secondary | ICD-10-CM | POA: Diagnosis not present

## 2020-06-02 DIAGNOSIS — I4892 Unspecified atrial flutter: Secondary | ICD-10-CM

## 2020-06-02 DIAGNOSIS — Z7901 Long term (current) use of anticoagulants: Secondary | ICD-10-CM

## 2020-06-02 DIAGNOSIS — I208 Other forms of angina pectoris: Secondary | ICD-10-CM

## 2020-06-02 MED ORDER — METOPROLOL TARTRATE 25 MG PO TABS
ORAL_TABLET | ORAL | 3 refills | Status: DC
Start: 2020-06-02 — End: 2020-08-19

## 2020-06-02 NOTE — Progress Notes (Signed)
Suzanne Hunt Date of Birth: [birth year] MRN: [id number] Primary Care Provider:Sun, Gari Crown, MD  Former Cardiologist: Dr. Adrian Prows and Jeri Lager, APRN, FNP-C Primary Cardiologist: Rex Kras, DO (established care 07/16/2019)  Date: 06/02/20 Last Office Visit: 08/05/2019  Chief Complaint  Patient presents with  . Atrial Flutter  . Follow-up    10 month  . Chest Pain   HPI  Suzanne Hunt is a 70 y.o.  female who presents to the office with a chief complaint of " 24-monthfollow-up for epigastric pain and a flutter management." Patient's past medical history and cardiovascular risk factors include: Hypertension, hyperlipidemia, history of anterior STEMI status post PCI to the mid/distal LAD with balloon angioplasty, history of atrial fibrillation, paroxysmal atrial flutter (EKG 07/16/2019), postmenopausal female, advanced age.   Patient was suppose to have a 642-monthollow-up but never did; however chose to be seen today due to epigastric pain. Patient states that she been experiencing epigastric discomfort and diaphoresis over the last 1 week.  The pain is not always brought on by effort related activities and does not always resolve with rest.  The pain is usually self-limited.  Patient is concerned because these symptoms are very similar to when she had her anterior STEMI back in 2019.  Patient states that she did take sublingual nitroglycerin tablets but her discomfort did not improve.  She denies any ER visit or urgent care visits for cardiovascular symptoms since last office encounter.  No significant change in physical endurance.  Medications reconciled.    FUNCTIONAL STATUS: She walks 6 days a week at least 3 to 4 miles each time.  And does resistance training 5 days a week.  ALLERGIES: Allergies  Allergen Reactions  . Actinel [Pseudoephedrine-Dm-Gg] Other (See Comments)    Flu like symptoms   MEDICATION LIST PRIOR TO VISIT: Current Outpatient Medications  on File Prior to Visit  Medication Sig Dispense Refill  . apixaban (ELIQUIS) 5 MG TABS tablet Take 1 tablet (5 mg total) by mouth 2 (two) times daily. 180 tablet 0  . aspirin EC 81 MG tablet Take 1 tablet (81 mg total) by mouth daily. 90 tablet 3  . atorvastatin (LIPITOR) 40 MG tablet Take 1 tablet (40 mg total) by mouth daily. 90 tablet 1  . Calcium Carb-Cholecalciferol (CALCIUM + VITAMIN D3 PO) Take 1 tablet by mouth 2 (two) times daily.    . Marland Kitchenbandronate (BONIVA) 150 MG tablet Take 150 mg by mouth every 30 (thirty) days. Take in the morning with a full glass of water, on an empty stomach, and do not take anything else by mouth or lie down for the next 30 min. Takes the first week of the month.    . losartan (COZAAR) 25 MG tablet Take 1 tablet (25 mg total) by mouth daily. 30 tablet 0  . Multiple Vitamin (MULTIVITAMIN WITH MINERALS) TABS Take 1 tablet by mouth 2 (two) times daily.     . nitroGLYCERIN (NITROSTAT) 0.4 MG SL tablet Place 1 tablet (0.4 mg total) under the tongue every 5 (five) minutes x 3 doses as needed for chest pain. 30 tablet 6  . omeprazole (PRILOSEC) 20 MG capsule Take 20 mg by mouth daily.    . Polyvinyl Alcohol-Povidone PF 1.4-0.6 % SOLN Place 1 drop into both eyes daily as needed (for dry eyes).     No current facility-administered medications on file prior to visit.    PAST MEDICAL HISTORY: Past Medical History:  Diagnosis Date  . CAD (coronary  artery disease) 01/07/2019  . Coronary artery disease   . HTN (hypertension) 01/07/2019  . Hypertension   . Medical history non-contributory   . Myocardial infarction [facility])     PAST SURGICAL HISTORY: Past Surgical History:  Procedure Laterality Date  . CORONARY/GRAFT ACUTE MI REVASCULARIZATION N/A 12/08/2017   Procedure: Coronary/Graft Acute MI Revascularization;  Surgeon: Adrian Prows, MD;  Location: [place] [facility];  Service: Cardiovascular;  Laterality: N/A;  . LEFT HEART CATH AND CORONARY ANGIOGRAPHY N/A 12/08/2017    Procedure: LEFT HEART CATH AND CORONARY ANGIOGRAPHY;  Surgeon: Adrian Prows, MD;  Location: [facility];  Service: Cardiovascular;  Laterality: N/A;  . TEE WITHOUT CARDIOVERSION N/A 03/25/2018   Procedure: TRANSESOPHAGEAL ECHOCARDIOGRAM (TEE);  Surgeon: Adrian Prows, MD;  Location: [facility] ENDOS[facility];  Service: Cardiovascular;  Laterality: N/A;    FAMILY HISTORY: The patient's family history includes Breast cancer (age of onset: 74) in her paternal aunt; Breast cancer (age of onset: 24) in her sister; Diabetes in her father and sister; Heart disease in her mother and sister.   SOCIAL HISTORY:  The patient  reports that she has never smoked. She has never used smokeless tobacco. She reports that she does not drink alcohol and does not use drugs.  Review of Systems  Constitutional: Negative for chills and fever.  HENT: Negative for ear discharge, ear pain and nosebleeds.   Eyes: Negative for blurred vision and discharge.  Cardiovascular: Negative for chest pain, claudication, dyspnea on exertion, leg swelling, near-syncope, orthopnea, palpitations, paroxysmal nocturnal dyspnea and syncope.  Respiratory: Negative for cough and shortness of breath.   Endocrine: Negative for polydipsia, polyphagia and polyuria.  Hematologic/Lymphatic: Negative for bleeding problem.  Skin: Negative for flushing and nail changes.  Musculoskeletal: Positive for muscle cramps and myalgias.  Gastrointestinal: Negative for abdominal pain, dysphagia, hematemesis, hematochezia, melena, nausea and vomiting.  Neurological: Negative for dizziness, focal weakness and light-headedness.    PHYSICAL EXAM: Vitals with BMI 06/02/2020 08/05/2019 07/16/2019  Height _0  _1  _2   Weight 137 lbs 130 lbs 129 lbs  BMI 24.27 06.26 94.85  Systolic 462 703 500  Diastolic 74 59 75  Pulse 71 54 65   CONSTITUTIONAL: Well-developed and well-nourished. No acute distress.  SKIN: Skin is warm and dry. No rash noted. No cyanosis. No  pallor. No jaundice HEAD: Normocephalic and atraumatic.  EYES: No scleral icterus MOUTH/THROAT: Moist oral membranes.  NECK: No JVD present. No thyromegaly noted. No carotid bruits  LYMPHATIC: No visible cervical adenopathy.  CHEST Normal respiratory effort. No intercostal retractions  LUNGS: Clear to auscultation bilaterally.  No stridor. No wheezes. No rales.  CARDIOVASCULAR: Irregularly irregular, positive X3-G1, soft holosystolic murmur heard at the apex, no gallops or rubs. ABDOMINAL: Nonobese, soft, nontender, nondistended, positive bowel sounds in all 4 quadrants.  No apparent ascites.  EXTREMITIES: No peripheral edema  HEMATOLOGIC: No significant bruising NEUROLOGIC: Oriented to person, place, and time. Nonfocal. Normal muscle tone.  PSYCHIATRIC: Normal mood and affect. Normal behavior. Cooperative  CARDIAC DATABASE: EKG: 12/18/2017: Coarse atrial fibrillation with controlled ventricular response at the rate of 77 bpm, normal axis, anterolateral infarct old with persistent deep T wave inversion in V1 to V6. Abnormal EKG. 01/07/2019: Sinus bradycardia 55 bpm with 1 PAC, left atrial abnormality, normal axis, anteroseptal infarct old.  Diffuse nonspecific T wave flattening. No changes compared to previous EKG.  07/16/2019:Atrial flutter, controlled ventricular rate, normal axis deviation no underlying injury pattern. 06/02/2020: Atrial flutter, 60 bpm, normal axis, nonspecific T wave  abnormality.  Compared to prior EKG dated 4/21/2021Sinus bradycardia transitioned to atrial flutter.   Echocardiogram: TEE 03/25/2018: Normal LV systolic function, trileaflet aortic valve. There is mild coaptation of the non-and left coronary cusp at the base leading to eccentric mild aortic valve regurgitation. No vegetation. Otherwise no significant abnormalities identified. Compared to the TTE 12/09/2017, EF is improved from 35-40%.  Transthoracic echo 01/28/2018: LVEF 60%, biatrial dilatation mild AR, mild  to moderate MR, mild MAC, mild to moderate MR, mild to moderate TR, RVSP 33 mmHg.  Stress Testing:  None  Heart Catheterization: 12/08/2017:  Dist RCA lesion is 30% stenosed.  Mid LAD to Dist LAD lesion is 100% stenosed. Balloon angioplasty was performed using a BALLOON SAPPHIRE 2.0X12.  Post intervention, there is a 10% residual stenosis at the apical LAD. Excellent blush and timi 3 flow was present.  Prox LAD lesion is 30% stenosed.  LV end diastolic pressure is normal.  There is mild left ventricular systolic dysfunction.  The left ventricular ejection fraction is 45-50% by visual estimate.  Balloon angioplasty was performed using a BALLOON SAPPHIRE 2.0X12.   LABORATORY DATA: CBC Latest Ref Rng & Units 08/10/2019 12/09/2017 12/08/2017  WBC 3.4 - 10.8 x10E3/uL 3.3(L) 5.8 5.9  Hemoglobin 11.1 - 15.9 g/dL 12.7 11.9(L) 12.3  Hematocrit 34.0 - 46.6 % 38.7 37.3 38.4  Platelets 150 - 450 x10E3/uL 151 179 185    CMP Latest Ref Rng & Units 08/31/2019 08/10/2019 07/16/2019  Glucose 65 - 99 mg/dL 81 79 82  BUN 8 - 27 mg/dL 24 27 28(H)  Creatinine 0.57 - 1.00 mg/dL 1.14(H) 1.27(H) 1.22(H)  Sodium 134 - 144 mmol/L 141 140 140  Potassium 3.5 - 5.2 mmol/L 4.5 4.4 4.6  Chloride 96 - 106 mmol/L 107(H) 104 104  CO2 20 - 29 mmol/L _0 Calcium 8.7 - 10.3 mg/dL 9.0 9.2 9.7  Total Protein 6.0 - 8.5 g/dL 5.9(L) 6.2 -  Total Bilirubin 0.0 - 1.2 mg/dL 0.7 0.7 -  Alkaline Phos 48 - 121 IU/L 145(H) 136(H) -  AST 0 - 40 IU/L 49(H) 41(H) -  ALT 0 - 32 IU/L 66(H) 40(H) -    Lipid Panel     Component Value Date/Time   CHOL 136 08/10/2019 0825   TRIG 36 08/10/2019 0825   HDL 43 08/10/2019 0825   CHOLHDL 3.1 12/09/2017 0616   VLDL 7 12/09/2017 0616   LDLCALC 84 08/10/2019 0825   LABVLDL 9 08/10/2019 0825    Lab Results  Component Value Date   HGBA1C 4.9 12/08/2017   No components found for: NTPROBNP Lab Results  Component Value Date   TSH 3.796 12/09/2017   FINAL MEDICATION LIST  END OF ENCOUNTER: Meds ordered this encounter  Medications  . metoprolol tartrate (LOPRESSOR) 25 MG tablet    Sig: TAKE ONE TABLET TWICE A DAY    Dispense:  90 tablet    Refill:  3     Current Outpatient Medications:  .  apixaban (ELIQUIS) 5 MG TABS tablet, Take 1 tablet (5 mg total) by mouth 2 (two) times daily., Disp: 180 tablet, Rfl: 0 .  aspirin EC 81 MG tablet, Take 1 tablet (81 mg total) by mouth daily., Disp: 90 tablet, Rfl: 3 .  atorvastatin (LIPITOR) 40 MG tablet, Take 1 tablet (40 mg total) by mouth daily., Disp: 90 tablet, Rfl: 1 .  Calcium Carb-Cholecalciferol (CALCIUM + VITAMIN D3 PO), Take 1 tablet by mouth 2 (two) times daily., Disp: , Rfl:  .  ibandronate (BONIVA) 150 MG tablet, Take 150 mg by mouth every 30 (thirty) days. Take in the morning with a full glass of water, on an empty stomach, and do not take anything else by mouth or lie down for the next 30 min. Takes the first week of the month., Disp: , Rfl:  .  losartan (COZAAR) 25 MG tablet, Take 1 tablet (25 mg total) by mouth daily., Disp: 30 tablet, Rfl: 0 .  Multiple Vitamin (MULTIVITAMIN WITH MINERALS) TABS, Take 1 tablet by mouth 2 (two) times daily. , Disp: , Rfl:  .  nitroGLYCERIN (NITROSTAT) 0.4 MG SL tablet, Place 1 tablet (0.4 mg total) under the tongue every 5 (five) minutes x 3 doses as needed for chest pain., Disp: 30 tablet, Rfl: 6 .  omeprazole (PRILOSEC) 20 MG capsule, Take 20 mg by mouth daily., Disp: , Rfl:  .  Polyvinyl Alcohol-Povidone PF 1.4-0.6 % SOLN, Place 1 drop into both eyes daily as needed (for dry eyes)., Disp: , Rfl:  .  metoprolol tartrate (LOPRESSOR) 25 MG tablet, TAKE ONE TABLET TWICE A DAY, Disp: 90 tablet, Rfl: 3  IMPRESSION:    ICD-10-CM   1. Anginal equivalent (HCC)  I20.8 PCV MYOCARDIAL PERFUSION WITH LEXISCAN    metoprolol tartrate (LOPRESSOR) 25 MG tablet  2. Paroxysmal atrial flutter (HCC)  I48.92 EKG 12-Lead    PCV MYOCARDIAL PERFUSION WITH LEXISCAN    metoprolol tartrate  (LOPRESSOR) 25 MG tablet  3. Long term (current) use of anticoagulants  Z79.01   4. Hx of ST elevation myocardial infarction  I25.2   5. History of coronary angioplasty with insertion of stent  Z95.5   6. Coronary artery disease of native artery of native heart with stable angina pectoris ([place])  I25.118   7. Essential hypertension  I10   8. Mixed hyperlipidemia  E78.2      RECOMMENDATIONS: Suzanne Hunt is a 70 y.o. female whose past medical history and cardiovascular risk factors include: Hypertension, hyperlipidemia, history of anterior STEMI status post PCI to the mid/distal LAD with balloon angioplasty, history of atrial fibrillation, postmenopausal female, advanced age.  Anginal equivalent: Her symptoms are not concerning for typical pain.  But there her anginal equivalent prior to her anterior STEMI 2019.  She underwent PCI and had other nonobstructive disease.  We discussed undergoing left heart catheterization but patient would like to hold off on it at the current time because it is an invasive procedure.  Shared decision was to proceed with Lexiscan to further evaluate for reversible ischemia.  Patient has not an appropriate exercise candidate as she has underlying atrial flutter and do not want her to go into RVR.  Increase Lopressor to 25 mg p.o. twice daily. Continue aspirin 81 mg p.o. daily.   Continue statin therapy. Patient has sublingual nitroglycerin tablets to use on a as needed basis. EKG performed and interpreted.  Findings noted above. She is asked to seek medical attention by going to the closest ER via EMS if her symptoms increase in intensity, frequency, and/or duration.  Paroxysmal atrial flutter:  . Rate control: Metoprolol. Marland Kitchen Rhythm control: N/A. Marland Kitchen Thromboembolic prophylaxis: Eliquis   . CHA2DS2-VASc SCORE is 4 which correlates to 4 % risk of stroke per year.  . Increase Lopressor to 25 mg p.o. twice daily. . We discussed undergoing cardioversion with  possible TEE guided.  But would like to complete ischemic work-up first.  Long-term oral anticoagulation:  Indication paroxysmal atrial flutter.  Patient does not endorse any evidence  of bleeding.  Coronary artery disease with angina pectoris with status post angioplasty and stenting: See above.  History of anterior ST segment elevation MI: See above  Mixed hyperlipidemia: Continue statin therapy.  Cholesterol currently being managed by her primary care provider.  Patient was on Crestor and has been titrated down to Lipitor given her myalgias.  Noncardiac history and symptoms concerning for anginal equivalents I would like to see her in approximately 3-week follow-up or sooner once the stress test is complete.  In the interim she is strongly encouraged to seek medical attention if her symptoms are similar to her prior anterior wall STEMI or has typical chest pain as discussed during today's office encounter.  She verbalizes understanding and provides feedback.  Orders Placed This Encounter  Procedures  . PCV MYOCARDIAL PERFUSION WITH LEXISCAN  . EKG 12-Lead   --Continue cardiac medications as reconciled in final medication list. --Return in about 3 weeks (around 06/23/2020) for Follow up, Chest pain, Review test results. Or sooner if needed. --Continue follow-up with your primary care physician regarding the management of your other chronic comorbid conditions.  Patient's questions and concerns were addressed to her satisfaction. She voices understanding of the instructions provided during this encounter.   This note was created using a voice recognition software as a result there may be grammatical errors inadvertently enclosed that do not reflect the nature of this encounter. Every attempt is made to correct such errors.  Total time spent: 42 minutes discussing history of present illness, reviewing prior studies, discussing stress test versus left heart catheterization, complex decision  making, and coordination of care.  Rex Kras, [place], [facility]  Pager: [phone] Office: [phone]

## 2020-06-22 ENCOUNTER — Other Ambulatory Visit: Payer: Medicare Other

## 2020-06-30 ENCOUNTER — Ambulatory Visit: Payer: Medicare Other | Admitting: Cardiology

## 2020-07-06 ENCOUNTER — Other Ambulatory Visit: Payer: Medicare Other

## 2020-07-28 NOTE — Progress Notes (Deleted)
Suzanne Hunt Date of Birth: 02-Aug-1950 MRN: [id number] Primary Care Provider:Sun, Charise Carwin, MD  Former Cardiologist: Dr. Yates Decamp and Altamese Reddell, APRN, FNP-C Primary Cardiologist: [name], DO, [facility] (established care 07/16/2019)  ***  No chief complaint on file.  HPI  Suzanne Hunt is a 70 y.o.  female who presents to the office with a chief complaint of "***." Patient's past medical history and cardiovascular risk factors include: Hypertension, hyperlipidemia, history of anterior STEMI status post PCI to the mid/distal LAD with balloon angioplasty, history of atrial fibrillation, paroxysmal atrial flutter (EKG 07/16/2019), postmenopausal female, advanced age.   Patient was suppose to have a 31-month follow-up but never did; however chose to be seen today due to epigastric pain. Patient states that she been experiencing epigastric discomfort and diaphoresis over the last 1 week.  The pain is not always brought on by effort related activities and does not always resolve with rest.  The pain is usually self-limited.  Patient is concerned because these symptoms are very similar to when she had her anterior STEMI back in 2019.  Patient states that she did take sublingual nitroglycerin tablets but her discomfort did not improve.  She denies any ER visit or urgent care visits for cardiovascular symptoms since last office encounter.  No significant change in physical endurance.  Medications reconciled.    FUNCTIONAL STATUS: She walks 6 days a week at least 3 to 4 miles each time.  And does resistance training 5 days a week.  ALLERGIES: Allergies  Allergen Reactions  . Actinel [Pseudoephedrine-Dm-Gg] Other (See Comments)    Flu like symptoms   MEDICATION LIST PRIOR TO VISIT: Current Outpatient Medications on File Prior to Visit  Medication Sig Dispense Refill  . apixaban (ELIQUIS) 5 MG TABS tablet Take 1 tablet (5 mg total) by mouth 2 (two) times daily. 180 tablet 0  .  aspirin EC 81 MG tablet Take 1 tablet (81 mg total) by mouth daily. 90 tablet 3  . atorvastatin (LIPITOR) 40 MG tablet Take 1 tablet (40 mg total) by mouth daily. 90 tablet 1  . Calcium Carb-Cholecalciferol (CALCIUM + VITAMIN D3 PO) Take 1 tablet by mouth 2 (two) times daily.    Marland Kitchen ibandronate (BONIVA) 150 MG tablet Take 150 mg by mouth every 30 (thirty) days. Take in the morning with a full glass of water, on an empty stomach, and do not take anything else by mouth or lie down for the next 30 min. Takes the first week of the month.    . losartan (COZAAR) 25 MG tablet Take 1 tablet (25 mg total) by mouth daily. 30 tablet 0  . metoprolol tartrate (LOPRESSOR) 25 MG tablet TAKE ONE TABLET TWICE A DAY 90 tablet 3  . Multiple Vitamin (MULTIVITAMIN WITH MINERALS) TABS Take 1 tablet by mouth 2 (two) times daily.     . nitroGLYCERIN (NITROSTAT) 0.4 MG SL tablet Place 1 tablet (0.4 mg total) under the tongue every 5 (five) minutes x 3 doses as needed for chest pain. 30 tablet 6  . omeprazole (PRILOSEC) 20 MG capsule Take 20 mg by mouth daily.    . Polyvinyl Alcohol-Povidone PF 1.4-0.6 % SOLN Place 1 drop into both eyes daily as needed (for dry eyes).     No current facility-administered medications on file prior to visit.    PAST MEDICAL HISTORY: Past Medical History:  Diagnosis Date  . CAD (coronary artery disease) 01/07/2019  . Coronary artery disease   . HTN (hypertension) 01/07/2019  .  Hypertension   . Medical history non-contributory   . Myocardial infarction (HCC)     PAST SURGICAL HISTORY: Past Surgical History:  Procedure Laterality Date  . CORONARY/GRAFT [facility] L[facility] MI REVASCULARIZATION N/A 12/08/2017   Procedure: Coronary/Graft Acute MI Revascularization;  Surgeon: Yates DecampGanji, Jay, MD;  [facility];  Service: Cardiovascular;  Laterality: N/A;  . LEFT HEART CATH AND CORONARY ANGIOGRAPHY N/A 12/08/2017   Procedure: LEFT HEART CATH AND CORONARY ANGIOGRAPHY;  Surgeon: Yates DecampGanji, Jay, MD;   Location: [facility] INVASIVE CV LAB;  Service: Cardiovascular;  Laterality: N/A;  . TEE WITHOUT CARDIOVERSION N/A 03/25/2018   Procedure: TRANSESOPHAGEAL ECHOCARDIOGRAM (TEE);  Surgeon: Yates DecampGanji, Jay, MD;  [facility];  Service: Cardiovascular;  Laterality: N/A;    FAMILY HISTORY: The patient's family history includes Breast cancer (age of onset: 5440) in her paternal aunt; Breast cancer (age of onset: 4360) in her sister; Diabetes in her father and sister; Heart disease in her mother and sister.   SOCIAL HISTORY:  The patient  reports that she has never smoked. She has never used smokeless tobacco. She reports that she does not drink alcohol and does not use drugs.  Review of Systems  Constitutional: Negative for chills and fever.  HENT: Negative for ear discharge, ear pain and nosebleeds.   Eyes: Negative for blurred vision and discharge.  Cardiovascular: Negative for chest pain, claudication, dyspnea on exertion, leg swelling, near-syncope, orthopnea, palpitations, paroxysmal nocturnal dyspnea and syncope.  Respiratory: Negative for cough and shortness of breath.   Endocrine: Negative for polydipsia, polyphagia and polyuria.  Hematologic/Lymphatic: Negative for bleeding problem.  Skin: Negative for flushing and nail changes.  Musculoskeletal: Positive for muscle cramps and myalgias.  Gastrointestinal: Negative for abdominal pain, dysphagia, hematemesis, hematochezia, melena, nausea and vomiting.  Neurological: Negative for dizziness, focal weakness and light-headedness.    PHYSICAL EXAM: Vitals with BMI 06/02/2020 08/05/2019 07/16/2019  Height 5\' 3"  5\' 3"  5\' 3"   Weight 137 lbs 130 lbs 129 lbs  BMI 24.27 23.03 22.86  Systolic 139 126 [id number]  Diastolic 74 59 75  Pulse 71 54 65   CONSTITUTIONAL: Well-developed and well-nourished. No acute distress.  SKIN: Skin is warm and dry. No rash noted. No cyanosis. No pallor. No jaundice HEAD: Normocephalic and atraumatic.  EYES: No scleral  icterus MOUTH/THROAT: Moist oral membranes.  NECK: No JVD present. No thyromegaly noted. No carotid bruits  LYMPHATIC: No visible cervical adenopathy.  CHEST Normal respiratory effort. No intercostal retractions  LUNGS: Clear to auscultation bilaterally.  No stridor. No wheezes. No rales.  CARDIOVASCULAR: Irregularly irregular, positive S1-S2, soft holosystolic murmur heard at the apex, no gallops or rubs. ABDOMINAL: Nonobese, soft, nontender, nondistended, positive bowel sounds in all 4 quadrants.  No apparent ascites.  EXTREMITIES: No peripheral edema  HEMATOLOGIC: No significant bruising NEUROLOGIC: Oriented to person, place, and time. Nonfocal. Normal muscle tone.  PSYCHIATRIC: Normal mood and affect. Normal behavior. Cooperative  CARDIAC DATABASE: EKG: 06/02/2020: Atrial flutter, 60 bpm, normal axis, nonspecific T wave abnormality.  Compared to prior EKG dated 4/21/2021Sinus bradycardia transitioned to atrial flutter.   Echocardiogram: 07/24/2019: Left ventricle cavity is normal in size and wall thickness.  Normal global wall motion. Normal LV systolic function with EF 70%.  Normal diastolic filling pattern.  Left atrial cavity is moderately dilated.  Trileaflet aortic valve. Trace aortic regurgitation.  Moderate (Grade II) mitral regurgitation.  Moderate tricuspid regurgitation.  Estimated pulmonary artery systolic pressure is 35-40 mmHg.  Unlike study in 2019, paravalvular aortic regurgitation is not  appreciated. No other significant change noted.   Stress Testing:  None  Heart Catheterization: 12/08/2017:  Dist RCA lesion is 30% stenosed.  Mid LAD to Dist LAD lesion is 100% stenosed. Balloon angioplasty was performed using a BALLOON SAPPHIRE 2.0X12.  Post intervention, there is a 10% residual stenosis at the apical LAD. Excellent blush and timi 3 flow was present.  Prox LAD lesion is 30% stenosed.  LV end diastolic pressure is normal.  There is mild left  ventricular systolic dysfunction.  The left ventricular ejection fraction is 45-50% by visual estimate.  Balloon angioplasty was performed using a BALLOON SAPPHIRE 2.0X12.   LABORATORY DATA: CBC Latest Ref Rng & Units 08/10/2019 12/09/2017 12/08/2017  WBC 3.4 - 10.8 x10E3/uL 3.3(L) 5.8 5.9  Hemoglobin 11.1 - 15.9 g/dL 27.7 11.9(L) 12.3  Hematocrit 34.0 - 46.6 % 38.7 37.3 38.4  Platelets 150 - 450 x10E3/uL 151 179 185    CMP Latest Ref Rng & Units 08/31/2019 08/10/2019 07/16/2019  Glucose 65 - 99 mg/dL 81 79 82  BUN 8 - 27 mg/dL 24 27 41(O)  Creatinine 0.57 - 1.00 mg/dL 8.78(M) 7.67(M) 0.94(B)  Sodium 134 - 144 mmol/L 141 140 140  Potassium 3.5 - 5.2 mmol/L 4.5 4.4 4.6  Chloride 96 - 106 mmol/L 107(H) 104 104  CO2 20 - 29 mmol/L 21 24 23   Calcium 8.7 - 10.3 mg/dL 9.0 9.2 9.7  Total Protein 6.0 - 8.5 g/dL 5.9(L) 6.2 -  Total Bilirubin 0.0 - 1.2 mg/dL 0.7 0.7 -  Alkaline Phos 48 - 121 IU/L 145(H) 136(H) -  AST 0 - 40 IU/L 49(H) 41(H) -  ALT 0 - 32 IU/L 66(H) 40(H) -    Lipid Panel     Component Value Date/Time   CHOL 136 08/10/2019 0825   TRIG 36 08/10/2019 0825   HDL 43 08/10/2019 0825   CHOLHDL 3.1 12/09/2017 0616   VLDL 7 12/09/2017 0616   LDLCALC 84 08/10/2019 0825   LABVLDL 9 08/10/2019 0825    Lab Results  Component Value Date   HGBA1C 4.9 12/08/2017   No components found for: NTPROBNP Lab Results  Component Value Date   TSH 3.796 12/09/2017   FINAL MEDICATION LIST END OF ENCOUNTER: No orders of the defined types were placed in this encounter.    Current Outpatient Medications:  .  apixaban (ELIQUIS) 5 MG TABS tablet, Take 1 tablet (5 mg total) by mouth 2 (two) times daily., Disp: 180 tablet, Rfl: 0 .  aspirin EC 81 MG tablet, Take 1 tablet (81 mg total) by mouth daily., Disp: 90 tablet, Rfl: 3 .  atorvastatin (LIPITOR) 40 MG tablet, Take 1 tablet (40 mg total) by mouth daily., Disp: 90 tablet, Rfl: 1 .  Calcium Carb-Cholecalciferol (CALCIUM + VITAMIN D3 PO),  Take 1 tablet by mouth 2 (two) times daily., Disp: , Rfl:  .  ibandronate (BONIVA) 150 MG tablet, Take 150 mg by mouth every 30 (thirty) days. Take in the morning with a full glass of water, on an empty stomach, and do not take anything else by mouth or lie down for the next 30 min. Takes the first week of the month., Disp: , Rfl:  .  losartan (COZAAR) 25 MG tablet, Take 1 tablet (25 mg total) by mouth daily., Disp: 30 tablet, Rfl: 0 .  metoprolol tartrate (LOPRESSOR) 25 MG tablet, TAKE ONE TABLET TWICE A DAY, Disp: 90 tablet, Rfl: 3 .  Multiple Vitamin (MULTIVITAMIN WITH MINERALS) TABS, Take 1 tablet by mouth  2 (two) times daily. , Disp: , Rfl:  .  nitroGLYCERIN (NITROSTAT) 0.4 MG SL tablet, Place 1 tablet (0.4 mg total) under the tongue every 5 (five) minutes x 3 doses as needed for chest pain., Disp: 30 tablet, Rfl: 6 .  omeprazole (PRILOSEC) 20 MG capsule, Take 20 mg by mouth daily., Disp: , Rfl:  .  Polyvinyl Alcohol-Povidone PF 1.4-0.6 % SOLN, Place 1 drop into both eyes daily as needed (for dry eyes)., Disp: , Rfl:   IMPRESSION:  No diagnosis found.   RECOMMENDATIONS: Belvia Gotschall is a 70 y.o. female whose past medical history and cardiovascular risk factors include: Hypertension, hyperlipidemia, history of anterior STEMI status post PCI to the mid/distal LAD with balloon angioplasty, history of atrial fibrillation, postmenopausal female, advanced age.  Anginal equivalent: Her symptoms are not concerning for typical pain.  But there her anginal equivalent prior to her anterior STEMI 2019.  She underwent PCI and had other nonobstructive disease.  We discussed undergoing left heart catheterization but patient would like to hold off on it at the current time because it is an invasive procedure.  Shared decision was to proceed with Lexiscan to further evaluate for reversible ischemia.  Patient has not an appropriate exercise candidate as she has underlying atrial flutter and do not  want her to go into RVR.  Increase Lopressor to 25 mg p.o. twice daily. Continue aspirin 81 mg p.o. daily.   Continue statin therapy. Patient has sublingual nitroglycerin tablets to use on a as needed basis. EKG performed and interpreted.  Findings noted above. She is asked to seek medical attention by going to the closest ER via EMS if her symptoms increase in intensity, frequency, and/or duration.  Paroxysmal atrial flutter:  . Rate control: Metoprolol. Marland Kitchen Rhythm control: N/A. Marland Kitchen Thromboembolic prophylaxis: Eliquis   . CHA2DS2-VASc SCORE is 4 which correlates to 4 % risk of stroke per year.  . Increase Lopressor to 25 mg p.o. twice daily. . We discussed undergoing cardioversion with possible TEE guided.  But would like to complete ischemic work-up first.  Long-term oral anticoagulation:  Indication paroxysmal atrial flutter.  Patient does not endorse any evidence of bleeding.  Coronary artery disease with angina pectoris with status post angioplasty and stenting: See above.  History of anterior ST segment elevation MI: See above  Mixed hyperlipidemia: Continue statin therapy.  Cholesterol currently being managed by her primary care provider.  Patient was on Crestor and has been titrated down to Lipitor given her myalgias.  Noncardiac history and symptoms concerning for anginal equivalents I would like to see her in approximately 3-week follow-up or sooner once the stress test is complete.  In the interim she is strongly encouraged to seek medical attention if her symptoms are similar to her prior anterior wall STEMI or has typical chest pain as discussed during today's office encounter.  She verbalizes understanding and provides feedback.  No orders of the defined types were placed in this encounter.  --Continue cardiac medications as reconciled in final medication list. --No follow-ups on file. Or sooner if needed. --Continue follow-up with your primary care physician regarding the  management of your other chronic comorbid conditions.  Patient's questions and concerns were addressed to her satisfaction. She voices understanding of the instructions provided during this encounter.   This note was created using a voice recognition software as a result there may be grammatical errors inadvertently enclosed that do not reflect the nature of this encounter. Every attempt is made to  correct such errors.  Total time spent: 42 minutes discussing history of present illness, reviewing prior studies, discussing stress test versus left heart catheterization, complex decision making, and coordination of care.  [name], [place], [facility]  Pager: [phone] Office: [phone]

## 2020-08-01 ENCOUNTER — Ambulatory Visit: Payer: Medicare Other | Admitting: Cardiology

## 2020-08-01 DIAGNOSIS — I25118 Atherosclerotic heart disease of native coronary artery with other forms of angina pectoris: Secondary | ICD-10-CM

## 2020-08-01 DIAGNOSIS — E782 Mixed hyperlipidemia: Secondary | ICD-10-CM

## 2020-08-01 DIAGNOSIS — I10 Essential (primary) hypertension: Secondary | ICD-10-CM

## 2020-08-01 DIAGNOSIS — I252 Old myocardial infarction: Secondary | ICD-10-CM

## 2020-08-01 DIAGNOSIS — I208 Other forms of angina pectoris: Secondary | ICD-10-CM

## 2020-08-01 DIAGNOSIS — Z7901 Long term (current) use of anticoagulants: Secondary | ICD-10-CM

## 2020-08-01 DIAGNOSIS — I4892 Unspecified atrial flutter: Secondary | ICD-10-CM

## 2020-08-01 DIAGNOSIS — Z955 Presence of coronary angioplasty implant and graft: Secondary | ICD-10-CM

## 2020-08-03 ENCOUNTER — Other Ambulatory Visit: Payer: Self-pay

## 2020-08-03 ENCOUNTER — Ambulatory Visit: Payer: Medicare Other

## 2020-08-03 DIAGNOSIS — I208 Other forms of angina pectoris: Secondary | ICD-10-CM

## 2020-08-03 DIAGNOSIS — I4892 Unspecified atrial flutter: Secondary | ICD-10-CM

## 2020-08-10 NOTE — Progress Notes (Signed)
No answer left a vm to call back

## 2020-08-11 ENCOUNTER — Telehealth: Payer: Self-pay

## 2020-08-11 NOTE — Telephone Encounter (Signed)
-----   Message from [place], [place] sent at 08/10/2020  3:32 PM EDT ----- Please asked the patient to come into the office to review her stress test results.  The study was noted to be intermediate risk.  However in the interim if she has worsening chest pain she should go to the hospital for further evaluation.

## 2020-08-12 ENCOUNTER — Telehealth: Payer: Self-pay

## 2020-08-12 NOTE — Telephone Encounter (Signed)
Pt called because she would like you to call her and explain exactly what symptoms she should be concerned about with the chest pain. She is still going to the gym and still has her nitro. Should she take the nitro still and wait or just go straight to the emergency room if it happens.

## 2020-08-12 NOTE — Progress Notes (Signed)
Called and spoke to pt regarding stress test results. Pt voiced understanding.

## 2020-08-12 NOTE — Telephone Encounter (Signed)
Do you want pt to be seen for an emergency visit or is it okay for her to just wait until the middle or end of may?

## 2020-08-12 NOTE — Telephone Encounter (Signed)
As long as she remains asymptomatic follow-up in 2 weeks is appropriate.

## 2020-08-12 NOTE — Telephone Encounter (Signed)
As long as patient is asymptomatic we will discuss the results at the next visit.  I would continue the same physical activity and avoid overexertion.  She can use nitroglycerin tablets if she develops chest pain but if she is requiring more than 2 tablets 5 minutes apart she should go to the hospital for further evaluation given her history.

## 2020-08-15 NOTE — Telephone Encounter (Signed)
Attempted to call pt, no answer. Left vm requesting call back.

## 2020-08-18 NOTE — Telephone Encounter (Signed)
2nd attempt : Called patient, NA, LMAM

## 2020-08-19 ENCOUNTER — Other Ambulatory Visit: Payer: Self-pay

## 2020-08-19 DIAGNOSIS — I208 Other forms of angina pectoris: Secondary | ICD-10-CM

## 2020-08-19 DIAGNOSIS — I4892 Unspecified atrial flutter: Secondary | ICD-10-CM

## 2020-08-19 MED ORDER — METOPROLOL TARTRATE 25 MG PO TABS: ORAL_TABLET | ORAL | 3 refills | Status: DC

## 2020-08-22 ENCOUNTER — Other Ambulatory Visit: Payer: Self-pay

## 2020-08-22 DIAGNOSIS — I4892 Unspecified atrial flutter: Secondary | ICD-10-CM

## 2020-08-22 DIAGNOSIS — I208 Other forms of angina pectoris: Secondary | ICD-10-CM

## 2020-08-22 DIAGNOSIS — I484 Atypical atrial flutter: Secondary | ICD-10-CM

## 2020-08-22 MED ORDER — ELIQUIS 5 MG PO TABS: 5.0000 mg | ORAL_TABLET | Freq: Two times a day (BID) | ORAL | 0 refills | Status: DC

## 2020-08-22 MED ORDER — METOPROLOL TARTRATE 25 MG PO TABS: ORAL_TABLET | ORAL | 3 refills | Status: DC

## 2020-08-26 ENCOUNTER — Ambulatory Visit: Payer: Medicare Other | Admitting: Cardiology

## 2020-09-20 ENCOUNTER — Other Ambulatory Visit: Payer: Self-pay | Admitting: Cardiology

## 2020-09-27 ENCOUNTER — Ambulatory Visit: Payer: Medicare Other | Admitting: Cardiology

## 2020-09-27 ENCOUNTER — Encounter: Payer: Self-pay | Admitting: Cardiology

## 2020-09-27 ENCOUNTER — Other Ambulatory Visit: Payer: Self-pay

## 2020-09-27 VITALS — BP 117/73 | HR 60 | Temp 97.9°F | Resp 16 | Ht 63.0 in | Wt 133.2 lb

## 2020-09-27 DIAGNOSIS — Z7901 Long term (current) use of anticoagulants: Secondary | ICD-10-CM | POA: Diagnosis not present

## 2020-09-27 DIAGNOSIS — E782 Mixed hyperlipidemia: Secondary | ICD-10-CM | POA: Diagnosis not present

## 2020-09-27 DIAGNOSIS — I4892 Unspecified atrial flutter: Secondary | ICD-10-CM | POA: Diagnosis not present

## 2020-09-27 DIAGNOSIS — R9439 Abnormal result of other cardiovascular function study: Secondary | ICD-10-CM | POA: Diagnosis not present

## 2020-09-27 DIAGNOSIS — I25118 Atherosclerotic heart disease of native coronary artery with other forms of angina pectoris: Secondary | ICD-10-CM

## 2020-09-27 DIAGNOSIS — Z955 Presence of coronary angioplasty implant and graft: Secondary | ICD-10-CM | POA: Diagnosis not present

## 2020-09-27 DIAGNOSIS — I10 Essential (primary) hypertension: Secondary | ICD-10-CM

## 2020-09-27 DIAGNOSIS — I252 Old myocardial infarction: Secondary | ICD-10-CM | POA: Diagnosis not present

## 2020-09-27 MED ORDER — NITROGLYCERIN 0.4 MG SL SUBL
0.4000 mg | SUBLINGUAL_TABLET | SUBLINGUAL | 6 refills | Status: DC | PRN
Start: 2020-09-27 — End: 2022-06-28

## 2020-09-27 NOTE — Progress Notes (Signed)
Suzanne Hunt Date of Birth: 11/03/1950 MRN: [id number] Primary Care Provider:Sun, Gari Crown, MD  Former Cardiologist: Dr. Adrian Prows and Jeri Lager, APRN, FNP-C Primary Cardiologist: Rex Kras, DO (established care 07/16/2019)  Date: 09/27/20 Last Office Visit: 06/02/2020  Chief Complaint  Patient presents with   Results    Stress test   Chest Pain   HPI  Suzanne Hunt is a 70 y.o.  female who presents to the office with a chief complaint of " Chest pain and review test results." Patient's past medical history and cardiovascular risk factors include: Hypertension, hyperlipidemia, history of anterior STEMI status post PCI to the mid/distal LAD with balloon angioplasty, history of atrial fibrillation, paroxysmal atrial flutter (EKG 07/16/2019), postmenopausal female, advanced age.   During last office visit patient was concerned regarding the epigastric discomfort that she was experiencing at times of effort related activities and also at rest.  Her anginal discomfort prior to her anterior wall STEMI was epigastric discomfort.  Given her symptoms this year decision was to proceed with nuclear stress test to evaluate for reversible ischemia.  Since last office visit patient states that she continues to have epigastric discomfort, intermittently, lasting for few minutes at a time, and is self-limiting or requires her to take 1 tablet of sublingual nitroglycerin and aspirin 81 mg.  She ends up using nitro and aspirin combination at least 3 times a week due to this discomfort.  And the pain can be brought up by effort related activities but not always.  FUNCTIONAL STATUS: She walks 6 days a week at least 3 to 4 miles each time.  And does resistance training 5 days a week.  ALLERGIES: Allergies  Allergen Reactions   Actinel [Pseudoephedrine-Dm-Gg] Other (See Comments)    Flu like symptoms   MEDICATION LIST PRIOR TO VISIT: Current Outpatient Medications on File Prior to  Visit  Medication Sig Dispense Refill   apixaban (ELIQUIS) 5 MG TABS tablet Take 1 tablet (5 mg total) by mouth 2 (two) times daily. 60 tablet 0   aspirin EC 81 MG tablet Take 1 tablet (81 mg total) by mouth daily. 90 tablet 3   Calcium Carb-Cholecalciferol (CALCIUM + VITAMIN D3 PO) Take 1 tablet by mouth 2 (two) times daily.     ibandronate (BONIVA) 150 MG tablet Take 150 mg by mouth every 30 (thirty) days. Take in the morning with a full glass of water, on an empty stomach, and do not take anything else by mouth or lie down for the next 30 min. Takes the first week of the month.     losartan (COZAAR) 25 MG tablet TAKE 1 TABLET EVERY EVENING 90 tablet 3   metoprolol tartrate (LOPRESSOR) 25 MG tablet TAKE ONE TABLET TWICE A DAY 90 tablet 3   Multiple Vitamin (MULTIVITAMIN WITH MINERALS) TABS Take 1 tablet by mouth 2 (two) times daily.      omeprazole (PRILOSEC) 20 MG capsule Take 20 mg by mouth daily.     Polyvinyl Alcohol-Povidone PF 1.4-0.6 % SOLN Place 1 drop into both eyes daily as needed (for dry eyes).     rosuvastatin (CRESTOR) 20 MG tablet Take 20 mg by mouth at bedtime.     No current facility-administered medications on file prior to visit.    PAST MEDICAL HISTORY: Past Medical History:  Diagnosis Date   CAD (coronary artery disease) 01/07/2019   Coronary artery disease    HTN (hypertension) 01/07/2019   Hypertension    Medical history non-contributory  Myocardial infarction [facility])     PAST SURGICAL HISTORY: Past Surgical History:  Procedure Laterality Date   CORONARY/GRAFT ACUTE MI REVASCULARIZATION N/A 12/08/2017   Procedure: Coronary/Graft Acute MI Revascularization;  Surgeon: Adrian Prows, MD;  Location: [place] [facility];  Service: Cardiovascular;  Laterality: N/A;   LEFT HEART CATH AND CORONARY ANGIOGRAPHY N/A 12/08/2017   Procedure: LEFT HEART CATH AND CORONARY ANGIOGRAPHY;  Surgeon: Adrian Prows, MD;  Location: [place] [facility];  Service: Cardiovascular;  Laterality:  N/A;   TEE WITHOUT CARDIOVERSION N/A 03/25/2018   Procedure: TRANSESOPHAGEAL ECHOCARDIOGRAM (TEE);  Surgeon: Adrian Prows, MD;  Location: [facility] ENDOSCOPY;  Service: Cardiovascular;  Laterality: N/A;    FAMILY HISTORY: The patient's family history includes Breast cancer (age of onset: 87) in her paternal aunt; Breast cancer (age of onset: 78) in her sister; Diabetes in her father and sister; Heart disease in her mother and sister.   SOCIAL HISTORY:  The patient  reports that she has never smoked. She has never used smokeless tobacco. She reports that she does not drink alcohol and does not use drugs.  Review of Systems  Constitutional: Negative for chills and fever.  HENT:  Negative for ear discharge, ear pain and nosebleeds.   Eyes:  Negative for blurred vision and discharge.  Cardiovascular:  Positive for chest pain. Negative for claudication, dyspnea on exertion, leg swelling, near-syncope, orthopnea, palpitations, paroxysmal nocturnal dyspnea and syncope.  Respiratory:  Negative for cough and shortness of breath.   Endocrine: Negative for polydipsia, polyphagia and polyuria.  Hematologic/Lymphatic: Negative for bleeding problem.  Skin:  Negative for flushing and nail changes.  Musculoskeletal:  Negative for muscle cramps and myalgias.  Gastrointestinal:  Negative for abdominal pain, dysphagia, hematemesis, hematochezia, melena, nausea and vomiting.  Neurological:  Negative for dizziness, focal weakness and light-headedness.   PHYSICAL EXAM: Vitals with BMI 09/27/2020 06/02/2020 08/05/2019  Height _0  _1  _2   Weight 133 lbs 3 oz 137 lbs 130 lbs  BMI 23.6 33.29 51.88  Systolic 416 606 301  Diastolic 73 74 59  Pulse 60 71 54   CONSTITUTIONAL: Well-developed and well-nourished. No acute distress.  SKIN: Skin is warm and dry. No rash noted. No cyanosis. No pallor. No jaundice HEAD: Normocephalic and atraumatic.  EYES: No scleral icterus MOUTH/THROAT: Moist oral membranes.  NECK: No  JVD present. No thyromegaly noted. No carotid bruits  LYMPHATIC: No visible cervical adenopathy.  CHEST Normal respiratory effort. No intercostal retractions  LUNGS: Clear to auscultation bilaterally.  No stridor. No wheezes. No rales.  CARDIOVASCULAR: Irregularly irregular, positive S0-F0, soft holosystolic murmur heard at the apex, no gallops or rubs. ABDOMINAL: Nonobese, soft, nontender, nondistended, positive bowel sounds in all 4 quadrants.  No apparent ascites.  EXTREMITIES: No peripheral edema  HEMATOLOGIC: No significant bruising NEUROLOGIC: Oriented to person, place, and time. Nonfocal. Normal muscle tone.  PSYCHIATRIC: Normal mood and affect. Normal behavior. Cooperative  CARDIAC DATABASE: EKG: 12/18/2017: Coarse atrial fibrillation with controlled ventricular response at the rate of 77 bpm, normal axis, anterolateral infarct old with persistent deep T wave inversion in V1 to V6. Abnormal EKG. 06/02/2020: Atrial flutter, 60 bpm, normal axis, nonspecific T wave abnormality.  Compared to prior EKG dated 4/21/2021Sinus bradycardia transitioned to atrial flutter.  09/27/2020: Sinus bradycardia, 54 bpm, normal axis, old anteroseptal infarct, diffuse nonspecific T wave abnormality, low voltage.  Echocardiogram: TEE 03/25/2018: Normal LV systolic function, trileaflet aortic valve. There is mild coaptation of the non-and left coronary cusp at the base leading  to eccentric mild aortic valve regurgitation. No vegetation. Otherwise no significant abnormalities identified. Compared to the TTE 12/09/2017, EF is improved from 35-40%.  Transthoracic echo 01/28/2018: LVEF 60%, biatrial dilatation mild AR, mild to moderate MR, mild MAC, mild to moderate MR, mild to moderate TR, RVSP 33 mmHg.  Stress Testing:  Lexiscan Tetrofosmin Stress Test  08/03/2020: Equivocal ECG stress.  Resting EKG demonstrated normal sinus rhythm. Non-specific T-wave abnormalities. Peak EKG revealed less than 1 mm ST depression  present with Lexiscan infusion. Myocardial perfusion is abnormal. There is a reversible mild defect in the apical region. Overall LV systolic function is normal without regional wall motion abnormalities. Stress LV EF: 62%. No previous exam available for comparison. Intermediate risk scan.  Heart Catheterization: 12/08/2017: Dist RCA lesion is 30% stenosed. Mid LAD to Dist LAD lesion is 100% stenosed. Balloon angioplasty was performed using a BALLOON SAPPHIRE 2.0X12. Post intervention, there is a 10% residual stenosis at the apical LAD. Excellent blush and timi 3 flow was present. Prox LAD lesion is 30% stenosed. LV end diastolic pressure is normal. There is mild left ventricular systolic dysfunction. The left ventricular ejection fraction is 45-50% by visual estimate. Balloon angioplasty was performed using a BALLOON SAPPHIRE 2.0X12.   LABORATORY DATA: CBC Latest Ref Rng & Units 08/10/2019 12/09/2017 12/08/2017  WBC 3.4 - 10.8 x10E3/uL 3.3(L) 5.8 5.9  Hemoglobin 11.1 - 15.9 g/dL 12.7 11.9(L) 12.3  Hematocrit 34.0 - 46.6 % 38.7 37.3 38.4  Platelets 150 - 450 x10E3/uL 151 179 185    CMP Latest Ref Rng & Units 08/31/2019 08/10/2019 07/16/2019  Glucose 65 - 99 mg/dL 81 79 82  BUN 8 - 27 mg/dL 24 27 28(H)  Creatinine 0.57 - 1.00 mg/dL 1.14(H) 1.27(H) 1.22(H)  Sodium 134 - 144 mmol/L 141 140 140  Potassium 3.5 - 5.2 mmol/L 4.5 4.4 4.6  Chloride 96 - 106 mmol/L 107(H) 104 104  CO2 20 - 29 mmol/L _0 Calcium 8.7 - 10.3 mg/dL 9.0 9.2 9.7  Total Protein 6.0 - 8.5 g/dL 5.9(L) 6.2 -  Total Bilirubin 0.0 - 1.2 mg/dL 0.7 0.7 -  Alkaline Phos 48 - 121 IU/L 145(H) 136(H) -  AST 0 - 40 IU/L 49(H) 41(H) -  ALT 0 - 32 IU/L 66(H) 40(H) -    Lipid Panel     Component Value Date/Time   CHOL 136 08/10/2019 0825   TRIG 36 08/10/2019 0825   HDL 43 08/10/2019 0825   CHOLHDL 3.1 12/09/2017 0616   VLDL 7 12/09/2017 0616   LDLCALC 84 08/10/2019 0825   LABVLDL 9 08/10/2019 0825    Lab Results   Component Value Date   HGBA1C 4.9 12/08/2017   No components found for: NTPROBNP Lab Results  Component Value Date   TSH 3.796 12/09/2017   FINAL MEDICATION LIST END OF ENCOUNTER: Meds ordered this encounter  Medications   nitroGLYCERIN (NITROSTAT) 0.4 MG SL tablet    Sig: Place 1 tablet (0.4 mg total) under the tongue every 5 (five) minutes x 3 doses as needed for chest pain.    Dispense:  30 tablet    Refill:  6     Current Outpatient Medications:    apixaban (ELIQUIS) 5 MG TABS tablet, Take 1 tablet (5 mg total) by mouth 2 (two) times daily., Disp: 60 tablet, Rfl: 0   aspirin EC 81 MG tablet, Take 1 tablet (81 mg total) by mouth daily., Disp: 90 tablet, Rfl: 3   Calcium Carb-Cholecalciferol (CALCIUM + VITAMIN  D3 PO), Take 1 tablet by mouth 2 (two) times daily., Disp: , Rfl:    ibandronate (BONIVA) 150 MG tablet, Take 150 mg by mouth every 30 (thirty) days. Take in the morning with a full glass of water, on an empty stomach, and do not take anything else by mouth or lie down for the next 30 min. Takes the first week of the month., Disp: , Rfl:    losartan (COZAAR) 25 MG tablet, TAKE 1 TABLET EVERY EVENING, Disp: 90 tablet, Rfl: 3   metoprolol tartrate (LOPRESSOR) 25 MG tablet, TAKE ONE TABLET TWICE A DAY, Disp: 90 tablet, Rfl: 3   Multiple Vitamin (MULTIVITAMIN WITH MINERALS) TABS, Take 1 tablet by mouth 2 (two) times daily. , Disp: , Rfl:    omeprazole (PRILOSEC) 20 MG capsule, Take 20 mg by mouth daily., Disp: , Rfl:    Polyvinyl Alcohol-Povidone PF 1.4-0.6 % SOLN, Place 1 drop into both eyes daily as needed (for dry eyes)., Disp: , Rfl:    rosuvastatin (CRESTOR) 20 MG tablet, Take 20 mg by mouth at bedtime., Disp: , Rfl:    nitroGLYCERIN (NITROSTAT) 0.4 MG SL tablet, Place 1 tablet (0.4 mg total) under the tongue every 5 (five) minutes x 3 doses as needed for chest pain., Disp: 30 tablet, Rfl: 6  IMPRESSION:    ICD-10-CM   1. Abnormal nuclear stress test  F68.12 Basic  metabolic panel    CBC    EKG 12-Lead    2. Paroxysmal atrial flutter (HCC)  I48.92     3. Long term (current) use of anticoagulants  Z79.01     4. Hx of ST elevation myocardial infarction  I25.2 nitroGLYCERIN (NITROSTAT) 0.4 MG SL tablet    5. History of coronary angioplasty with insertion of stent  Z95.5 nitroGLYCERIN (NITROSTAT) 0.4 MG SL tablet    6. Coronary artery disease of native artery of native heart with stable angina pectoris (HCC)  I25.118 nitroGLYCERIN (NITROSTAT) 0.4 MG SL tablet    7. Essential hypertension  I10     8. Mixed hyperlipidemia  E78.2        RECOMMENDATIONS: Davon Folta is a 70 y.o. female whose past medical history and cardiovascular risk factors include: Hypertension, hyperlipidemia, history of anterior STEMI status post PCI to the mid/distal LAD with balloon angioplasty, history of atrial fibrillation, postmenopausal female, advanced age.  Abnormal nuclear stress test: Her symptoms of epigastric discomfort that resolves with resting or taking aspirin 81 mg p.o. daily plus sublingual nitroglycerin tablets are very similar to her anginal equivalent prior to her anterior STEMI back in 2019. SPECT imaging shows small size, mild intensity reversible perfusion defect in the LAD distribution. Given her continued symptoms recommend invasive angiography to reevaluate for obstructive CAD and stent patency.  Patient is agreeable with the plan of care. Continue aspirin, statin therapy, ARB, beta-blocker therapy. Sublingual nitroglycerin tablets refilled. She is asked to seek medical attention by going to the closest ER via EMS if her symptoms increase in intensity, frequency, and/or duration. The procedure of left heart catheterization with possible intervention was explained to the patient in detail.  The indication, alternatives, risks and benefits were reviewed.  Complications include but not limited to bleeding, infection, vascular injury, stroke,  myocardial infection, arrhythmia, kidney injury requiring hemodialysis, radiation-related injury in the case of prolonged fluoroscopy use, emergency cardiac surgery, and death. The patient understands the risks of serious complication is 1-2 in 7517 with diagnostic cardiac cath and 1-2% or less with angioplasty/stenting.  The patient voices understanding and provides verbal feedback and wishes to proceed with coronary angiography with possible PCI.  Paroxysmal atrial flutter:  Currently sinus bradycardia on today's EKG Rate control: Metoprolol. Rhythm control: N/A. Thromboembolic prophylaxis: Eliquis   CHA2DS2-VASc SCORE is 4 which correlates to 4 % risk of stroke per year.   Long-term oral anticoagulation: Indication paroxysmal atrial flutter. Patient does not endorse any evidence of bleeding.  Coronary artery disease with angina pectoris with status post angioplasty and stenting: See above.  History of anterior ST segment elevation MI: See above  Mixed hyperlipidemia: Continue statin therapy.  Cholesterol currently being managed by her primary care provider. Recommend goal LDL less than 70 mg/dL.  Orders Placed This Encounter  Procedures   Basic metabolic panel   CBC   EKG 12-Lead   --Continue cardiac medications as reconciled in final medication list. --Return in about 2 weeks (around 10/11/2020) for Post heart catheterization. Or sooner if needed. --Continue follow-up with your primary care physician regarding the management of your other chronic comorbid conditions.  Patient's questions and concerns were addressed to her satisfaction. She voices understanding of the instructions provided during this encounter.   This note was created using a voice recognition software as a result there may be grammatical errors inadvertently enclosed that do not reflect the nature of this encounter. Every attempt is made to correct such errors.  Rex Kras, [place], [facility]  Pager:  [phone] Office: [phone]

## 2020-09-28 ENCOUNTER — Other Ambulatory Visit: Payer: Self-pay

## 2020-09-28 DIAGNOSIS — I208 Other forms of angina pectoris: Secondary | ICD-10-CM

## 2020-09-28 DIAGNOSIS — I4892 Unspecified atrial flutter: Secondary | ICD-10-CM

## 2020-09-28 MED ORDER — METOPROLOL SUCCINATE ER 50 MG PO TB24: 50.0000 mg | ORAL_TABLET | Freq: Every day | ORAL | 3 refills | Status: DC

## 2020-09-29 ENCOUNTER — Other Ambulatory Visit: Payer: Self-pay

## 2020-09-29 MED ORDER — METOPROLOL SUCCINATE ER 50 MG PO TB24: 50.0000 mg | ORAL_TABLET | Freq: Every day | ORAL | 3 refills | Status: DC

## 2020-10-12 ENCOUNTER — Telehealth: Payer: Self-pay | Admitting: Cardiology

## 2020-10-12 NOTE — Telephone Encounter (Signed)
Called Patient x2 to get scheduled for her cath on June 22 and June 23rd before patient went on vacation . Patient called back to advise she cant get cath before she goes out of town and will be availible after July 1 .

## 2020-10-20 ENCOUNTER — Other Ambulatory Visit: Payer: Self-pay | Admitting: Family Medicine

## 2020-10-20 DIAGNOSIS — Z1231 Encounter for screening mammogram for malignant neoplasm of breast: Secondary | ICD-10-CM

## 2020-10-28 ENCOUNTER — Emergency Department (HOSPITAL_COMMUNITY)
Admission: EM | Admit: 2020-10-28 | Discharge: 2020-10-28 | Disposition: A | Discharge status: Home / Self Care | Payer: BC Managed Care – PPO | Attending: Emergency Medicine | Admitting: Emergency Medicine

## 2020-10-28 ENCOUNTER — Encounter (HOSPITAL_COMMUNITY): Payer: Self-pay | Admitting: Emergency Medicine

## 2020-10-28 ENCOUNTER — Other Ambulatory Visit: Payer: Self-pay

## 2020-10-28 ENCOUNTER — Emergency Department (HOSPITAL_COMMUNITY): Payer: BC Managed Care – PPO

## 2020-10-28 DIAGNOSIS — Z7982 Long term (current) use of aspirin: Secondary | ICD-10-CM | POA: Diagnosis not present

## 2020-10-28 DIAGNOSIS — Z7901 Long term (current) use of anticoagulants: Secondary | ICD-10-CM | POA: Diagnosis not present

## 2020-10-28 DIAGNOSIS — Z79899 Other long term (current) drug therapy: Secondary | ICD-10-CM | POA: Insufficient documentation

## 2020-10-28 DIAGNOSIS — K769 Liver disease, unspecified: Secondary | ICD-10-CM | POA: Diagnosis not present

## 2020-10-28 DIAGNOSIS — R1032 Left lower quadrant pain: Secondary | ICD-10-CM | POA: Insufficient documentation

## 2020-10-28 DIAGNOSIS — I10 Essential (primary) hypertension: Secondary | ICD-10-CM | POA: Diagnosis not present

## 2020-10-28 DIAGNOSIS — D259 Leiomyoma of uterus, unspecified: Secondary | ICD-10-CM | POA: Diagnosis not present

## 2020-10-28 DIAGNOSIS — M5432 Sciatica, left side: Secondary | ICD-10-CM

## 2020-10-28 DIAGNOSIS — M25552 Pain in left hip: Secondary | ICD-10-CM | POA: Insufficient documentation

## 2020-10-28 DIAGNOSIS — I700 Atherosclerosis of aorta: Secondary | ICD-10-CM | POA: Diagnosis not present

## 2020-10-28 DIAGNOSIS — I2510 Atherosclerotic heart disease of native coronary artery without angina pectoris: Secondary | ICD-10-CM | POA: Insufficient documentation

## 2020-10-28 DIAGNOSIS — M549 Dorsalgia, unspecified: Secondary | ICD-10-CM

## 2020-10-28 DIAGNOSIS — M545 Low back pain, unspecified: Secondary | ICD-10-CM | POA: Diagnosis not present

## 2020-10-28 LAB — COMPREHENSIVE METABOLIC PANEL
ALT: 31 U/L (ref 0–44)
AST: 28 U/L (ref 15–41)
Albumin: 3.2 g/dL — ABNORMAL LOW (ref 3.5–5.0)
Alkaline Phosphatase: 95 U/L (ref 38–126)
Anion gap: 9 (ref 5–15)
BUN: 21 mg/dL (ref 8–23)
CO2: 23 mmol/L (ref 22–32)
Calcium: 9.2 mg/dL (ref 8.9–10.3)
Chloride: 104 mmol/L (ref 98–111)
Creatinine, Ser: 1.1 mg/dL — ABNORMAL HIGH (ref 0.44–1.00)
GFR, Estimated: 55 mL/min — ABNORMAL LOW (ref >60)
Glucose, Bld: 117 mg/dL — ABNORMAL HIGH (ref 70–99)
Potassium: 4.1 mmol/L (ref 3.5–5.1)
Sodium: 136 mmol/L (ref 135–145)
Total Bilirubin: 1.4 mg/dL — ABNORMAL HIGH (ref 0.3–1.2)
Total Protein: 6.6 g/dL (ref 6.5–8.1)

## 2020-10-28 LAB — CBC
HCT: 39.6 % (ref 36.0–46.0)
Hemoglobin: 12.7 g/dL (ref 12.0–15.0)
MCH: 31.6 pg (ref 26.0–34.0)
MCHC: 32.1 g/dL (ref 30.0–36.0)
MCV: 98.5 fL (ref 80.0–100.0)
Platelets: 177 10*3/uL (ref 150–400)
RBC: 4.02 MIL/uL (ref 3.87–5.11)
RDW: 13.2 % (ref 11.5–15.5)
WBC: 6.2 10*3/uL (ref 4.0–10.5)
nRBC: 0 % (ref 0.0–0.2)

## 2020-10-28 LAB — LIPASE, BLOOD: Lipase: 32 U/L (ref 11–51)

## 2020-10-28 MED ORDER — PREDNISONE 10 MG PO TABS: 20.0000 mg | ORAL_TABLET | Freq: Every day | ORAL | 0 refills | Status: DC

## 2020-10-28 MED ORDER — DICLOFENAC SODIUM 1 % EX GEL: 4.0000 g | Freq: Four times a day (QID) | CUTANEOUS | 0 refills | Status: DC

## 2020-10-28 MED ORDER — ONDANSETRON HCL 4 MG/2ML IJ SOLN
4.0000 mg | Freq: Once | INTRAMUSCULAR | Status: AC
  Administered 2020-10-28: 4 mg via INTRAVENOUS
  Filled 2020-10-28: qty 2

## 2020-10-28 MED ORDER — FENTANYL CITRATE (PF) 100 MCG/2ML IJ SOLN
50.0000 ug | Freq: Once | INTRAMUSCULAR | Status: AC
  Administered 2020-10-28: 50 ug via INTRAVENOUS
  Filled 2020-10-28: qty 2

## 2020-10-28 NOTE — ED Provider Notes (Signed)
[facility] EMERGENCY DEPARTMENT Provider Note   CSN: [id number] Arrival date & time: 10/28/20  1018     History Chief Complaint  Patient presents with   Abdominal Pain   Back Pain    Suzanne Hunt is a 70 y.o. female.  HPI  Patient presents with left flank, suprapubic and left lower quadrant abdominal pain, left hip pain.  All of the pain started yesterday when sitting down.  The pain has been constant, aggravated by movement.  Pain is left flank, left lower back and radiates down left hip. Reports she is no longer able to ambulate well because of the pain.  She has not had any nausea, vomiting, diarrhea, fevers, chills.  She denies any alleviating factors.  No recent mechanical falls or trauma to the area.  No previous history of kidney stones.  No dysuria or hematuria.  Past Medical History:  Diagnosis Date   CAD (coronary artery disease) 01/07/2019   Coronary artery disease    HTN (hypertension) 01/07/2019   Hypertension    Medical history non-contributory    Myocardial infarction [facility])     Patient Active Problem List   Diagnosis Date Noted   CAD (coronary artery disease) 01/07/2019   HTN (hypertension) 01/07/2019   Aortic regurgitation 02/08/2018   Atrial fibrillation (HCC) 12/10/2017   STEMI involving left anterior descending coronary artery (HCC) 12/08/2017    Past Surgical History:  Procedure Laterality Date   CORONARY/GRAFT ACUTE MI REVASCULARIZATION N/A 12/08/2017   Procedure: Coronary/Graft Acute MI Revascularization;  Surgeon: Yates Decamp, MD;  Location: [facility] INVASIVE CV LAB;  Service: Cardiovascular;  Laterality: N/A;   LEFT HEART CATH AND CORONARY ANGIOGRAPHY N/A 12/08/2017   Procedure: LEFT HEART CATH AND CORONARY ANGIOGRAPHY;  Surgeon: Yates Decamp, MD;  Location: [facility] INVASIVE CV LAB;  Service: Cardiovascular;  Laterality: N/A;   TEE WITHOUT CARDIOVERSION N/A 03/25/2018   Procedure: TRANSESOPHAGEAL ECHOCARDIOGRAM (TEE);  Surgeon: Yates Decamp, MD;  Location: [facility] ENDOSCOPY;  Service: Cardiovascular;  Laterality: N/A;     OB History   No obstetric history on file.     Family History  Problem Relation Age of Onset   Breast cancer Sister 53   Diabetes Sister    Heart disease Sister    Breast cancer Paternal Aunt 97   Heart disease Mother    Diabetes Father     Social History   Tobacco Use   Smoking status: Never   Smokeless tobacco: Never  Vaping Use   Vaping Use: Never used  Substance Use Topics   Alcohol use: No   Drug use: No    Home Medications Prior to Admission medications   Medication Sig Start Date End Date Taking? Authorizing Provider  apixaban (ELIQUIS) 5 MG TABS tablet Take 1 tablet (5 mg total) by mouth 2 (two) times daily. 08/22/20   Tolia, Sunit, DO  aspirin EC 81 MG tablet Take 1 tablet (81 mg total) by mouth daily. 01/07/19   Toniann Fail, NP  Calcium Carb-Cholecalciferol (CALCIUM + VITAMIN D3 PO) Take 1 tablet by mouth 2 (two) times daily.    [provider]  ibandronate (BONIVA) 150 MG tablet Take 150 mg by mouth every 30 (thirty) days. Take in the morning with a full glass of water, on an empty stomach, and do not take anything else by mouth or lie down for the next 30 min. Takes the first week of the month.    [provider]  losartan (COZAAR) 25 MG  tablet TAKE 1 TABLET EVERY EVENING 09/20/20   Tolia, Sunit, DO  metoprolol succinate (TOPROL-XL) 50 MG 24 hr tablet Take 1 tablet (50 mg total) by mouth daily. Take with or immediately following a meal. 09/29/20 12/28/20  Tolia, Sunit, DO  Multiple Vitamin (MULTIVITAMIN WITH MINERALS) TABS Take 1 tablet by mouth 2 (two) times daily.     [provider]  nitroGLYCERIN (NITROSTAT) 0.4 MG SL tablet Place 1 tablet (0.4 mg total) under the tongue every 5 (five) minutes x 3 doses as needed for chest pain. 09/27/20   Tolia, Sunit, DO  omeprazole (PRILOSEC) 20 MG capsule Take 20 mg by mouth daily. 12/09/19   [provider]  Polyvinyl Alcohol-Povidone PF 1.4-0.6 % SOLN Place 1 drop into both eyes daily as needed (for dry eyes).    [provider]  rosuvastatin (CRESTOR) 20 MG tablet Take 20 mg by mouth at bedtime. 08/28/20   [provider]    Allergies    Actinel [pseudoephedrine-[id number]]  Review of Systems   Review of Systems  Constitutional:  Negative for fever.  Respiratory:  Negative for shortness of breath.   Cardiovascular:  Negative for chest pain and leg swelling.  Gastrointestinal:  Positive for abdominal pain. Negative for anal bleeding, blood in stool, nausea and vomiting.  Genitourinary:  Positive for flank pain. Negative for dysuria and hematuria.  Musculoskeletal:        Hip pain   Neurological:  Negative for syncope and light-headedness.   Physical Exam Updated Vital Signs BP 126/67   Pulse 68   Temp 99.2 F (37.3 C) (Oral)   Resp 16   Ht 5\' 3"  (1.6 m)   Wt 59 kg   SpO2 99%   BMI 23.03 kg/m   Physical Exam Vitals and nursing note reviewed. Exam conducted with a chaperone present.  Constitutional:      Appearance: Normal appearance.     Comments: Sitting still upright, uncomfortable appearing  HENT:     Head: Normocephalic and atraumatic.  Eyes:     General: No scleral icterus.       Right eye: No discharge.        Left eye: No discharge.     Extraocular Movements: Extraocular movements intact.     Pupils: Pupils are equal, round, and reactive to light.  Cardiovascular:     Rate and Rhythm: Normal rate and regular rhythm.     Pulses: Normal pulses.     Heart sounds: Normal heart sounds. No murmur heard.   No friction rub. No gallop.  Pulmonary:     Effort: Pulmonary effort is normal. No respiratory distress.     Breath sounds: Normal breath sounds.  Abdominal:     General: Abdomen is flat. Bowel sounds are normal. There is no distension.     Palpations: Abdomen is soft.     Tenderness: There is abdominal tenderness in the suprapubic area and left  lower quadrant. There is left CVA tenderness.  Skin:    General: Skin is warm and dry.     Coloration: Skin is not jaundiced.  Neurological:     Mental Status: She is alert. Mental status is at baseline.     Coordination: Coordination normal.    ED Results / Procedures / Treatments   Labs (all labs ordered are listed, but only abnormal results are displayed) Labs Reviewed  LIPASE, BLOOD  COMPREHENSIVE METABOLIC PANEL  CBC  URINALYSIS, ROUTINE W REFLEX MICROSCOPIC    EKG None  Radiology No results found.  Procedures Procedures   Medications Ordered in ED Medications - No data to display  ED Course  I have reviewed the triage vital signs and the nursing notes.  Pertinent labs & imaging results that were available during my care of the patient were reviewed by me and considered in my medical decision making (see chart for details).  Clinical Course as of 10/28/20 1358  Fri Oct 28, 2020  1143 Lipase: 32 Not consistent with pancreatitis  [HS]  1143 CBC No leukocytosis concerning for infectious process. No anemia concerning for GIB [HS]  1143 Creatinine(!): 1.10 At baseline  [HS]    Clinical Course User Index [HS] Theron Arista, PA-C   MDM Rules/Calculators/A&P                          Patient is stable vitals, she is uncomfortable appearing.  There is some left CVA tenderness and left lower quadrant tenderness.  Diverticulitis and kidney stone are both on the differential.  I doubt a septic joint or fracture to the left hip given there is no trauma.  I suspect that pain is radiated from elsewhere.  Will order labs and a CT abdomen to evaluate.  Please see ED course for interpretation of labs and imaging.  CT scan is reassuring.  No signs of a kidney stone or abdominal process such as diverticulitis colitis.  Patient with physical exam findings, I suspect this pain is likely sciatica.  There are several abnormal findings on the CT scan which I discussed with the patient  and advised her to follow-up with her primary care doctor about.  Patient verbalized understanding and agreement with the plan.  I will prescribe her some prednisone and diclofenac gel to treat the back pain as needed.  Discussed HPI, physical exam and plan of care for this patient with attending Melene Plan. The attending physician evaluated this patient as part of a shared visit and agrees with plan of care.   Final Clinical Impression(s) / ED Diagnoses Final diagnoses:  None    Rx / DC Orders ED Discharge Orders     None        Theron Arista, PA-C 10/28/20 1411    Melene Plan, DO 10/28/20 1624

## 2020-10-28 NOTE — ED Triage Notes (Addendum)
Pt reports lower abd pain and left flank pain that has gotten worse since yesterday. Denies n/v/d, denies dysuria. Pt reports difficulty with ambulation d/t the pain.

## 2020-10-28 NOTE — Discharge Instructions (Addendum)
The pain appears to be due to sciatica.  This is a pinched nerve likely caused by some pain in your back.  Please apply the diclofenac gel to the area that hurts as needed.  May also take prednisone 40 mg once daily for 5 days.  This can help improve some of the healing.  Please also take Tylenol as needed for pain.  Please follow-up with your PCP in the next few weeks if the symptoms continue.  If things change or worsen please return back to the ED for further evaluation.  Follow-up with your primary care doctor regarding the findings on the CT scan.  A copy and pasted them below see you able to reference them. There are a few tiny 1-2 mm nodules in the lung bases including 2  mm right lower lobe pulmonary nodule. No follow-up needed if patient  is low-risk (and has no known or suspected primary neoplasm).  Non-contrast chest CT can be considered in 12 months if patient is  high-risk. This recommendation follows the consensus statement:  Guidelines for Management of Incidental Pulmonary Nodules Detected  on CT Images: From the Fleischner Society 2017; Radiology 2017;  284:228-243.

## 2020-10-28 NOTE — ED Notes (Signed)
Pt verbalizes understanding of discharge instructions. Opportunity for questions and answers were provided. Pt discharged from the ED.   ?

## 2020-11-03 DIAGNOSIS — R9439 Abnormal result of other cardiovascular function study: Secondary | ICD-10-CM | POA: Diagnosis not present

## 2020-11-04 LAB — BASIC METABOLIC PANEL
BUN/Creatinine Ratio: 23 (ref 12–28)
BUN: 25 mg/dL (ref 8–27)
CO2: 24 mmol/L (ref 20–29)
Calcium: 9.1 mg/dL (ref 8.7–10.3)
Chloride: 105 mmol/L (ref 96–106)
Creatinine, Ser: 1.08 mg/dL — ABNORMAL HIGH (ref 0.57–1.00)
Glucose: 88 mg/dL (ref 65–99)
Potassium: 4.4 mmol/L (ref 3.5–5.2)
Sodium: 142 mmol/L (ref 134–144)
eGFR: 56 mL/min/{1.73_m2} — ABNORMAL LOW (ref >59)

## 2020-11-04 LAB — CBC
Hematocrit: 36.9 % (ref 34.0–46.6)
Hemoglobin: 12.3 g/dL (ref 11.1–15.9)
MCH: 31.2 pg (ref 26.6–33.0)
MCHC: 33.3 g/dL (ref 31.5–35.7)
MCV: 94 fL (ref 79–97)
Platelets: 209 10*3/uL (ref 150–450)
RBC: 3.94 x10E6/uL (ref 3.77–5.28)
RDW: 12 % (ref 11.7–15.4)
WBC: 5.5 10*3/uL (ref 3.4–10.8)

## 2020-11-06 ENCOUNTER — Encounter (HOSPITAL_COMMUNITY): Payer: Self-pay | Admitting: Cardiology

## 2020-11-08 ENCOUNTER — Other Ambulatory Visit: Payer: Self-pay

## 2020-11-08 ENCOUNTER — Encounter (HOSPITAL_COMMUNITY)
Admission: RE | Disposition: A | Discharge status: Home / Self Care | Payer: Self-pay | Source: Home / Self Care | Attending: Cardiology

## 2020-11-08 ENCOUNTER — Ambulatory Visit (HOSPITAL_COMMUNITY)
Admission: RE | Admit: 2020-11-08 | Discharge: 2020-11-08 | Disposition: A | Discharge status: Home / Self Care | Payer: BC Managed Care – PPO | Attending: Cardiology | Admitting: Cardiology

## 2020-11-08 DIAGNOSIS — I484 Atypical atrial flutter: Secondary | ICD-10-CM

## 2020-11-08 DIAGNOSIS — I25118 Atherosclerotic heart disease of native coronary artery with other forms of angina pectoris: Secondary | ICD-10-CM | POA: Diagnosis not present

## 2020-11-08 DIAGNOSIS — I2510 Atherosclerotic heart disease of native coronary artery without angina pectoris: Secondary | ICD-10-CM | POA: Diagnosis present

## 2020-11-08 DIAGNOSIS — E785 Hyperlipidemia, unspecified: Secondary | ICD-10-CM | POA: Insufficient documentation

## 2020-11-08 DIAGNOSIS — I10 Essential (primary) hypertension: Secondary | ICD-10-CM | POA: Insufficient documentation

## 2020-11-08 DIAGNOSIS — I480 Paroxysmal atrial fibrillation: Secondary | ICD-10-CM | POA: Insufficient documentation

## 2020-11-08 HISTORY — PX: LEFT HEART CATH AND CORONARY ANGIOGRAPHY: CATH118249

## 2020-11-08 SURGERY — LEFT HEART CATH AND CORONARY ANGIOGRAPHY
Anesthesia: LOCAL

## 2020-11-08 MED ORDER — FENTANYL CITRATE (PF) 100 MCG/2ML IJ SOLN
INTRAMUSCULAR | Status: DC | PRN
  Administered 2020-11-08: 25 ug via INTRAVENOUS

## 2020-11-08 MED ORDER — HEPARIN SODIUM (PORCINE) 1000 UNIT/ML IJ SOLN
INTRAMUSCULAR | Status: AC
  Filled 2020-11-08: qty 1

## 2020-11-08 MED ORDER — FENTANYL CITRATE (PF) 100 MCG/2ML IJ SOLN
INTRAMUSCULAR | Status: AC
  Filled 2020-11-08: qty 2

## 2020-11-08 MED ORDER — SODIUM CHLORIDE 0.9 % IV SOLN: 250.0000 mL | INTRAVENOUS | Status: DC | PRN

## 2020-11-08 MED ORDER — ASPIRIN 81 MG PO CHEW
81.0000 mg | CHEWABLE_TABLET | ORAL | Status: AC
  Administered 2020-11-08: 81 mg via ORAL
  Filled 2020-11-08: qty 1

## 2020-11-08 MED ORDER — HEPARIN (PORCINE) IN NACL 1000-0.9 UT/500ML-% IV SOLN
INTRAVENOUS | Status: DC | PRN
  Administered 2020-11-08 (×2): 500 mL

## 2020-11-08 MED ORDER — SODIUM CHLORIDE 0.9% FLUSH: 3.0000 mL | INTRAVENOUS | Status: DC | PRN

## 2020-11-08 MED ORDER — MIDAZOLAM HCL 2 MG/2ML IJ SOLN
INTRAMUSCULAR | Status: AC
  Filled 2020-11-08: qty 2

## 2020-11-08 MED ORDER — IOHEXOL 350 MG/ML SOLN
INTRAVENOUS | Status: DC | PRN
  Administered 2020-11-08: 30 mL

## 2020-11-08 MED ORDER — HEPARIN SODIUM (PORCINE) 1000 UNIT/ML IJ SOLN
INTRAMUSCULAR | Status: DC | PRN
  Administered 2020-11-08: 3000 [IU] via INTRAVENOUS

## 2020-11-08 MED ORDER — VERAPAMIL HCL 2.5 MG/ML IV SOLN
INTRAVENOUS | Status: DC | PRN
  Administered 2020-11-08: 5 mL via INTRA_ARTERIAL

## 2020-11-08 MED ORDER — SODIUM CHLORIDE 0.9 % WEIGHT BASED INFUSION
3.0000 mL/kg/h | INTRAVENOUS | Status: DC
  Administered 2020-11-08: 3 mL/kg/h via INTRAVENOUS

## 2020-11-08 MED ORDER — HEPARIN (PORCINE) IN NACL 1000-0.9 UT/500ML-% IV SOLN
INTRAVENOUS | Status: AC
  Filled 2020-11-08: qty 1000

## 2020-11-08 MED ORDER — LIDOCAINE HCL (PF) 1 % IJ SOLN
INTRAMUSCULAR | Status: AC
  Filled 2020-11-08: qty 30

## 2020-11-08 MED ORDER — SODIUM CHLORIDE 0.9 % WEIGHT BASED INFUSION: 1.0000 mL/kg/h | INTRAVENOUS | Status: DC

## 2020-11-08 MED ORDER — MIDAZOLAM HCL 2 MG/2ML IJ SOLN
INTRAMUSCULAR | Status: DC | PRN
  Administered 2020-11-08: 2 mg via INTRAVENOUS

## 2020-11-08 MED ORDER — APIXABAN 5 MG PO TABS: 5.0000 mg | ORAL_TABLET | Freq: Two times a day (BID) | ORAL | 0 refills | Status: DC

## 2020-11-08 MED ORDER — LIDOCAINE HCL (PF) 1 % IJ SOLN
INTRAMUSCULAR | Status: DC | PRN
  Administered 2020-11-08: 2 mL

## 2020-11-08 MED ORDER — SODIUM CHLORIDE 0.9% FLUSH: 3.0000 mL | Freq: Two times a day (BID) | INTRAVENOUS | Status: DC

## 2020-11-08 MED ORDER — VERAPAMIL HCL 2.5 MG/ML IV SOLN
INTRAVENOUS | Status: AC
  Filled 2020-11-08: qty 2

## 2020-11-08 SURGICAL SUPPLY — 9 items
CATH OPTITORQUE TIG 4.0 5F (CATHETERS) ×1 IMPLANT
DEVICE RAD TR BAND REGULAR (VASCULAR PRODUCTS) ×1 IMPLANT
GLIDESHEATH SLEND A-KIT 6F 22G (SHEATH) ×1 IMPLANT
GUIDEWIRE INQWIRE 1.5J.035X260 (WIRE) ×1 IMPLANT
INQWIRE 1.5J .035X260CM (WIRE) ×2
KIT HEART LEFT (KITS) ×2 IMPLANT
PACK CARDIAC CATHETERIZATION (CUSTOM PROCEDURE TRAY) ×2 IMPLANT
TRANSDUCER W/STOPCOCK (MISCELLANEOUS) ×2 IMPLANT
TUBING CIL FLEX 10 FLL-RA (TUBING) ×2 IMPLANT

## 2020-11-08 NOTE — Progress Notes (Signed)
Subjective:  Female patient with coronary artery disease and history of anterior STEMI SP balloon angioplasty to mid and distal LAD on 12/08/2017, mild disease in other vessels, hypertension, hyperlipidemia, paroxysmal atrial fibrillation and atrial flutter who presents for evaluation of angina pectoris.  Due to recent acceleration in chest pain, she underwent nuclear stress test which was abnormal with EKG changes and also apical filling defect suggestive of ischemia.  In view of optimal medical therapy, and continued chest discomfort, she is now referred for cardiac catheterization.  Blood pressure 131/63, pulse (!) 55, temperature 98.2 F (36.8 C), temperature source Oral, height _0  (1.6 m), weight 56.7 kg, SpO2 100 %. Physical Exam Constitutional:      Appearance: She is normal weight.  Eyes:     Extraocular Movements: Extraocular movements intact.  Neck:     Vascular: No carotid bruit or JVD.  Cardiovascular:     Rate and Rhythm: Normal rate and regular rhythm.     Pulses: Intact distal pulses.     Heart sounds: Normal heart sounds. No murmur heard.   No gallop.  Pulmonary:     Effort: Pulmonary effort is normal.     Breath sounds: Normal breath sounds.  Abdominal:     General: Bowel sounds are normal.     Palpations: Abdomen is soft.  Musculoskeletal:        General: No swelling.     Cervical back: Normal range of motion and neck supple.     Right lower leg: No edema.     Left lower leg: No edema.  Neurological:     General: No focal deficit present.    Lab Results: BMP BNP (last 3 results) No results for input(s): BNP in the last 8760 hours.  ProBNP (last 3 results) No results for input(s): PROBNP in the last 8760 hours. BMP Latest Ref Rng & Units 11/03/2020 10/28/2020 08/31/2019  Glucose 65 - 99 mg/dL 88 117(H) 81  BUN 8 - 27 mg/dL _1 Creatinine 0.57 - 1.00 mg/dL 1.08(H) 1.10(H) 1.14(H)  BUN/Creat Ratio 12 - 28 23 - 21  Sodium 134 - 144 mmol/L 142 136 141   Potassium 3.5 - 5.2 mmol/L 4.4 4.1 4.5  Chloride 96 - 106 mmol/L 105 104 107(H)  CO2 20 - 29 mmol/L _2 Calcium 8.7 - 10.3 mg/dL 9.1 9.2 9.0   Hepatic Function Latest Ref Rng & Units 10/28/2020 08/31/2019 08/10/2019  Total Protein 6.5 - 8.1 g/dL 6.6 5.9(L) 6.2  Albumin 3.5 - 5.0 g/dL 3.2(L) 3.7(L) 3.8  AST 15 - 41 U/L 28 49(H) 41(H)  ALT 0 - 44 U/L 31 66(H) 40(H)  Alk Phosphatase 38 - 126 U/L 95 145(H) 136(H)  Total Bilirubin 0.3 - 1.2 mg/dL 1.4(H) 0.7 0.7   CBC Latest Ref Rng & Units 11/03/2020 10/28/2020 08/10/2019  WBC 3.4 - 10.8 x10E3/uL 5.5 6.2 3.3(L)  Hemoglobin 11.1 - 15.9 g/dL 12.3 12.7 12.7  Hematocrit 34.0 - 46.6 % 36.9 39.6 38.7  Platelets 150 - 450 x10E3/uL 209 177 151   Lipid Panel     Component Value Date/Time   CHOL 136 08/10/2019 0825   TRIG 36 08/10/2019 0825   HDL 43 08/10/2019 0825   CHOLHDL 3.1 12/09/2017 0616   VLDL 7 12/09/2017 0616   LDLCALC 84 08/10/2019 0825   Cardiac Panel (last 3 results) No results for input(s): CKTOTAL, CKMB, TROPONINI, RELINDX in the last 72 hours.  HEMOGLOBIN A1C Lab Results  Component Value Date  HGBA1C 4.9 12/08/2017   MPG 93.93 12/08/2017   TSH No results for input(s): TSH in the last 8760 hours. Imaging: No results found.  Cardiac Studies: CARDIAC DATABASE: EKG: 12/18/2017: Coarse atrial fibrillation with controlled ventricular response at the rate of 77 bpm, normal axis, anterolateral infarct old with persistent deep T wave inversion in V1 to V6. Abnormal EKG. 06/02/2020: Atrial flutter, 60 bpm, normal axis, nonspecific T wave abnormality.  Compared to prior EKG dated 4/21/2021Sinus bradycardia transitioned to atrial flutter.  09/27/2020: Sinus bradycardia, 54 bpm, normal axis, old anteroseptal infarct, diffuse nonspecific T wave abnormality, low voltage.  Echocardiogram: TEE 03/25/2018: Normal LV systolic function, trileaflet aortic valve. There is mild coaptation of the non-and left coronary cusp at the base  leading to eccentric mild aortic valve regurgitation. No vegetation. Otherwise no significant abnormalities identified. Compared to the TTE 12/09/2017, EF is improved from 35-40%.  Transthoracic echo 01/28/2018: LVEF 60%, biatrial dilatation mild AR, mild to moderate MR, mild MAC, mild to moderate MR, mild to moderate TR, RVSP 33 mmHg.  Stress Testing:  Lexiscan Tetrofosmin Stress Test  08/03/2020: Equivocal ECG stress.  Resting EKG demonstrated normal sinus rhythm. Non-specific T-wave abnormalities. Peak EKG revealed less than 1 mm ST depression present with Lexiscan infusion. Myocardial perfusion is abnormal. There is a reversible mild defect in the apical region. Overall LV systolic function is normal without regional wall motion abnormalities. Stress LV EF: 62%. No previous exam available for comparison. Intermediate risk scan.  Heart Catheterization: 12/08/2017: Dist RCA lesion is 30% stenosed. Mid LAD to Dist LAD lesion is 100% stenosed. Balloon angioplasty was performed using a BALLOON SAPPHIRE 2.0X12. Post intervention, there is a 10% residual stenosis at the apical LAD. Excellent blush and timi 3 flow was present. Prox LAD lesion is 30% stenosed. LV end diastolic pressure is normal. There is mild left ventricular systolic dysfunction. The left ventricular ejection fraction is 45-50% by visual estimate. Balloon angioplasty was performed using a BALLOON SAPPHIRE 2.0X12.  Scheduled Meds:  sodium chloride flush  3 mL Intravenous Q12H   Continuous Infusions:  sodium chloride     [START ON 11/09/2020] sodium chloride 3 mL/kg/hr (11/08/20 1141)   Followed by   Derrill Memo ON 11/09/2020] sodium chloride 1 mL/kg/hr (11/08/20 1241)   PRN Meds:.sodium chloride, sodium chloride flush  Assessment/Plan:  CAD of great vessel with stable angina pectoris 2.  Hypertension 3.  Hyperlipidemia 4.  Paroxysmal atrial fibrillation  CHA2DS2-VASc Score is 4.  Yearly risk of stroke: 4.8% (A, F, HTN, Vasc  Dz).  Score of 1=0.6; 2=2.2; 3=3.2; 4=4.8; 5=7.2; 6=9.8; 7=>9.8) -(CHF; HTN; vasc disease DM,  Female = 1; Age <65 =0; 65-74 = 1,  >75 =2; stroke/embolism= 2).    Recommendation: Patient has been scheduled for left heart catheterization possible coronary intervention.  All questions regarding possible risks and benefits were discussed with the patient and patient is willing to proceed.    Adrian Prows, MD, [facility] 11/08/2020, 2:01 PM Office: [phone] Fax: [phone] Pager: [phone]

## 2020-11-08 NOTE — Interval H&P Note (Signed)
History and Physical Interval Note:  11/08/2020 2:21 PM  Suzanne Hunt  has presented today for surgery, with the diagnosis of chest pain - cad - nstemi.  The various methods of treatment have been discussed with the patient and family. After consideration of risks, benefits and other options for treatment, the patient has consented to  Procedure(s): LEFT HEART CATH AND CORONARY ANGIOGRAPHY (N/A)  and possible intervention as a surgical intervention.  The patient's history has been reviewed, patient examined, no change in status, stable for surgery.  I have reviewed the patient's chart and labs.  Questions were answered to the patient's satisfaction.   Cath Lab Visit (complete for each Cath Lab visit)  Clinical Evaluation Leading to the Procedure:   ACS: No.  Non-ACS:    Anginal Classification: CCS II  Anti-ischemic medical therapy: Maximal Therapy (2 or more classes of medications)  Non-Invasive Test Results: Intermediate-risk stress test findings: cardiac mortality 1-3%/year  Prior CABG: No previous CABG  Yates Decamp

## 2020-11-08 NOTE — H&P (View-Only) (Signed)
Subjective:  Female patient with coronary artery disease and history of anterior STEMI SP balloon angioplasty to mid and distal LAD on 12/08/2017, mild disease in other vessels, hypertension, hyperlipidemia, paroxysmal atrial fibrillation and atrial flutter who presents for evaluation of angina pectoris.  Due to recent acceleration in chest pain, she underwent nuclear stress test which was abnormal with EKG changes and also apical filling defect suggestive of ischemia.  In view of optimal medical therapy, and continued chest discomfort, she is now referred for cardiac catheterization.  Blood pressure 131/63, pulse (!) 55, temperature 98.2 F (36.8 C), temperature source Oral, height _0  (1.6 m), weight 56.7 kg, SpO2 100 %. Physical Exam Constitutional:      Appearance: She is normal weight.  Eyes:     Extraocular Movements: Extraocular movements intact.  Neck:     Vascular: No carotid bruit or JVD.  Cardiovascular:     Rate and Rhythm: Normal rate and regular rhythm.     Pulses: Intact distal pulses.     Heart sounds: Normal heart sounds. No murmur heard.   No gallop.  Pulmonary:     Effort: Pulmonary effort is normal.     Breath sounds: Normal breath sounds.  Abdominal:     General: Bowel sounds are normal.     Palpations: Abdomen is soft.  Musculoskeletal:        General: No swelling.     Cervical back: Normal range of motion and neck supple.     Right lower leg: No edema.     Left lower leg: No edema.  Neurological:     General: No focal deficit present.    Lab Results: BMP BNP (last 3 results) No results for input(s): BNP in the last 8760 hours.  ProBNP (last 3 results) No results for input(s): PROBNP in the last 8760 hours. BMP Latest Ref Rng & Units 11/03/2020 10/28/2020 08/31/2019  Glucose 65 - 99 mg/dL 88 117(H) 81  BUN 8 - 27 mg/dL _1 Creatinine 0.57 - 1.00 mg/dL 1.08(H) 1.10(H) 1.14(H)  BUN/Creat Ratio 12 - 28 23 - 21  Sodium 134 - 144 mmol/L 142 136 141   Potassium 3.5 - 5.2 mmol/L 4.4 4.1 4.5  Chloride 96 - 106 mmol/L 105 104 107(H)  CO2 20 - 29 mmol/L _2 Calcium 8.7 - 10.3 mg/dL 9.1 9.2 9.0   Hepatic Function Latest Ref Rng & Units 10/28/2020 08/31/2019 08/10/2019  Total Protein 6.5 - 8.1 g/dL 6.6 5.9(L) 6.2  Albumin 3.5 - 5.0 g/dL 3.2(L) 3.7(L) 3.8  AST 15 - 41 U/L 28 49(H) 41(H)  ALT 0 - 44 U/L 31 66(H) 40(H)  Alk Phosphatase 38 - 126 U/L 95 145(H) 136(H)  Total Bilirubin 0.3 - 1.2 mg/dL 1.4(H) 0.7 0.7   CBC Latest Ref Rng & Units 11/03/2020 10/28/2020 08/10/2019  WBC 3.4 - 10.8 x10E3/uL 5.5 6.2 3.3(L)  Hemoglobin 11.1 - 15.9 g/dL 12.3 12.7 12.7  Hematocrit 34.0 - 46.6 % 36.9 39.6 38.7  Platelets 150 - 450 x10E3/uL 209 177 151   Lipid Panel     Component Value Date/Time   CHOL 136 08/10/2019 0825   TRIG 36 08/10/2019 0825   HDL 43 08/10/2019 0825   CHOLHDL 3.1 12/09/2017 0616   VLDL 7 12/09/2017 0616   LDLCALC 84 08/10/2019 0825   Cardiac Panel (last 3 results) No results for input(s): CKTOTAL, CKMB, TROPONINI, RELINDX in the last 72 hours.  HEMOGLOBIN A1C Lab Results  Component Value Date  HGBA1C 4.9 12/08/2017   MPG 93.93 12/08/2017   TSH No results for input(s): TSH in the last 8760 hours. Imaging: No results found.  Cardiac Studies: CARDIAC DATABASE: EKG: 12/18/2017: Coarse atrial fibrillation with controlled ventricular response at the rate of 77 bpm, normal axis, anterolateral infarct old with persistent deep T wave inversion in V1 to V6. Abnormal EKG. 06/02/2020: Atrial flutter, 60 bpm, normal axis, nonspecific T wave abnormality.  Compared to prior EKG dated 4/21/2021Sinus bradycardia transitioned to atrial flutter.  09/27/2020: Sinus bradycardia, 54 bpm, normal axis, old anteroseptal infarct, diffuse nonspecific T wave abnormality, low voltage.  Echocardiogram: TEE 03/25/2018: Normal LV systolic function, trileaflet aortic valve. There is mild coaptation of the non-and left coronary cusp at the base  leading to eccentric mild aortic valve regurgitation. No vegetation. Otherwise no significant abnormalities identified. Compared to the TTE 12/09/2017, EF is improved from 35-40%.  Transthoracic echo 01/28/2018: LVEF 60%, biatrial dilatation mild AR, mild to moderate MR, mild MAC, mild to moderate MR, mild to moderate TR, RVSP 33 mmHg.  Stress Testing:  Lexiscan Tetrofosmin Stress Test  08/03/2020: Equivocal ECG stress.  Resting EKG demonstrated normal sinus rhythm. Non-specific T-wave abnormalities. Peak EKG revealed less than 1 mm ST depression present with Lexiscan infusion. Myocardial perfusion is abnormal. There is a reversible mild defect in the apical region. Overall LV systolic function is normal without regional wall motion abnormalities. Stress LV EF: 62%. No previous exam available for comparison. Intermediate risk scan.  Heart Catheterization: 12/08/2017: Dist RCA lesion is 30% stenosed. Mid LAD to Dist LAD lesion is 100% stenosed. Balloon angioplasty was performed using a BALLOON SAPPHIRE 2.0X12. Post intervention, there is a 10% residual stenosis at the apical LAD. Excellent blush and timi 3 flow was present. Prox LAD lesion is 30% stenosed. LV end diastolic pressure is normal. There is mild left ventricular systolic dysfunction. The left ventricular ejection fraction is 45-50% by visual estimate. Balloon angioplasty was performed using a BALLOON SAPPHIRE 2.0X12.  Scheduled Meds:  sodium chloride flush  3 mL Intravenous Q12H   Continuous Infusions:  sodium chloride     [START ON 11/09/2020] sodium chloride 3 mL/kg/hr (11/08/20 1141)   Followed by   Derrill Memo ON 11/09/2020] sodium chloride 1 mL/kg/hr (11/08/20 1241)   PRN Meds:.sodium chloride, sodium chloride flush  Assessment/Plan:  CAD of great vessel with stable angina pectoris 2.  Hypertension 3.  Hyperlipidemia 4.  Paroxysmal atrial fibrillation  CHA2DS2-VASc Score is 4.  Yearly risk of stroke: 4.8% (A, F, HTN, Vasc  Dz).  Score of 1=0.6; 2=2.2; 3=3.2; 4=4.8; 5=7.2; 6=9.8; 7=>9.8) -(CHF; HTN; vasc disease DM,  Female = 1; Age <65 =0; 65-74 = 1,  >75 =2; stroke/embolism= 2).    Recommendation: Patient has been scheduled for left heart catheterization possible coronary intervention.  All questions regarding possible risks and benefits were discussed with the patient and patient is willing to proceed.    Adrian Prows, MD, [facility] 11/08/2020, 2:01 PM Office: [phone] Fax: [phone] Pager: [phone]

## 2020-11-09 ENCOUNTER — Encounter (HOSPITAL_COMMUNITY): Payer: Self-pay | Admitting: Cardiology

## 2020-11-15 ENCOUNTER — Other Ambulatory Visit (HOSPITAL_BASED_OUTPATIENT_CLINIC_OR_DEPARTMENT_OTHER): Payer: Self-pay

## 2020-11-15 ENCOUNTER — Ambulatory Visit: Payer: Medicare Other | Attending: Internal Medicine

## 2020-11-15 DIAGNOSIS — Z23 Encounter for immunization: Secondary | ICD-10-CM

## 2020-11-15 MED ORDER — COVID-19 MRNA VACC (MODERNA) 100 MCG/0.5ML IM SUSP
INTRAMUSCULAR | 0 refills | Status: DC
  Filled 2020-11-15: qty 0.25, 1d supply, fill #0

## 2020-11-15 NOTE — Progress Notes (Signed)
   Covid-19 Vaccination Clinic  Name:  Suzanne Hunt    MRN: [id number] DOB: 1950-04-26  11/15/2020  Ms. Suzanne Hunt was observed post Covid-19 immunization for 15 minutes without incident. She was provided with Vaccine Information Sheet and instruction to access the V-Safe system.   Ms. Suzanne Hunt was instructed to call 911 with any severe reactions post vaccine: Difficulty breathing  Swelling of face and throat  A fast heartbeat  A bad rash all over body  Dizziness and weakness   Immunizations Administered     Name Date Dose VIS Date Route   Moderna Covid-19 Booster Vaccine 11/15/2020 11:07 AM 0.25 mL 02/03/2020 Intramuscular   Manufacturer: Moderna   Lot: [id number]   NDC: 53976-734-19

## 2020-11-24 ENCOUNTER — Ambulatory Visit: Payer: Medicare Other | Admitting: Cardiology

## 2020-11-24 ENCOUNTER — Other Ambulatory Visit: Payer: Self-pay

## 2020-11-24 ENCOUNTER — Encounter: Payer: Self-pay | Admitting: Cardiology

## 2020-11-24 VITALS — BP 146/79 | HR 56 | Resp 16 | Ht 63.0 in | Wt 133.4 lb

## 2020-11-24 DIAGNOSIS — I10 Essential (primary) hypertension: Secondary | ICD-10-CM

## 2020-11-24 DIAGNOSIS — Z955 Presence of coronary angioplasty implant and graft: Secondary | ICD-10-CM

## 2020-11-24 DIAGNOSIS — I480 Paroxysmal atrial fibrillation: Secondary | ICD-10-CM

## 2020-11-24 DIAGNOSIS — R9439 Abnormal result of other cardiovascular function study: Secondary | ICD-10-CM

## 2020-11-24 DIAGNOSIS — Z7901 Long term (current) use of anticoagulants: Secondary | ICD-10-CM

## 2020-11-24 DIAGNOSIS — I25118 Atherosclerotic heart disease of native coronary artery with other forms of angina pectoris: Secondary | ICD-10-CM | POA: Diagnosis not present

## 2020-11-24 DIAGNOSIS — I252 Old myocardial infarction: Secondary | ICD-10-CM | POA: Diagnosis not present

## 2020-11-24 DIAGNOSIS — E782 Mixed hyperlipidemia: Secondary | ICD-10-CM | POA: Diagnosis not present

## 2020-11-24 DIAGNOSIS — I4892 Unspecified atrial flutter: Secondary | ICD-10-CM

## 2020-11-24 NOTE — Progress Notes (Signed)
Suzanne Hunt Date of Birth: 09-28-1950 MRN: [id number] Primary Care Provider:Sun, Charise Carwin, MD  Former Cardiologist: Dr. Yates Decamp and [place]ames[place], APRN, FNP-C Primary Cardiologist: [name], DO (established care 07/16/2019)  Date: 11/24/20 Last Office Visit: 09/27/2020  Chief Complaint  Patient presents with   Post heart catheterization   Follow-up   HPI  Suzanne Hunt is a 70 y.o.  female who presents to the office with a chief complaint of " post heart catheterization follow-up." Patient's past medical history and cardiovascular risk factors include: Hypertension, hyperlipidemia, history of anterior STEMI status post PCI to the mid/distal LAD with balloon angioplasty, history of atrial fibrillation, paroxysmal atrial flutter (EKG 07/16/2019), postmenopausal female, advanced age.   During the prior office visit patient was complaining of epigastric discomfort though appeared to be noncardiac; however, patient was concerned because that is how she presented with an acute anterior wall STEMI in the past.  Therefore she underwent nuclear stress test which was noted to be an intermediate risk scan as noted below.  Despite up titration of antianginal therapy, patient was requiring frequent sublingual nitroglycerin tablets and shared decision was to proceed with left heart catheterization.  She underwent left heart catheterization in July 2022 which noted patent stents in the LAD distribution without any new obstructive CAD.  Results reviewed with the patient in great detail and noted below for further reference.  Patient has done well postprocedure without any complications.  She continues to have the discomfort but has not required the use of sublingual nitroglycerin tablet since her left heart catheterization.  FUNCTIONAL STATUS: She walks 6 days a week at least 3 to 4 miles each time.  And does resistance training 5 days a week.  ALLERGIES: Allergies  Allergen  Reactions   Actinel [Pseudoephedrine-Dm-Gg] Other (See Comments)    Flu like symptoms   MEDICATION LIST PRIOR TO VISIT: Current Outpatient Medications on File Prior to Visit  Medication Sig Dispense Refill   apixaban (ELIQUIS) 5 MG TABS tablet Take 1 tablet (5 mg total) by mouth 2 (two) times daily. 60 tablet 0   Calcium Carb-Cholecalciferol (CALCIUM 500 + D3 PO) Take 1 tablet by mouth daily.     COVID-19 mRNA vaccine, Moderna, 100 MCG/0.5ML injection Inject into the muscle. 0.25 mL 0   diclofenac Sodium (VOLTAREN) 1 % GEL Apply 4 g topically 4 (four) times daily. 4 g 0   ibandronate (BONIVA) 150 MG tablet Take 150 mg by mouth every 30 (thirty) days. Take in the morning with a full glass of water, on an empty stomach, and do not take anything else by mouth or lie down for the next 30 min. Takes the first week of the month.     losartan (COZAAR) 25 MG tablet TAKE 1 TABLET EVERY EVENING 90 tablet 3   metoprolol succinate (TOPROL-XL) 50 MG 24 hr tablet Take 1 tablet (50 mg total) by mouth daily. Take with or immediately following a meal. 90 tablet 3   Multiple Vitamin (MULTIVITAMIN WITH MINERALS) TABS tablet Take 1 tablet by mouth daily.     nitroGLYCERIN (NITROSTAT) 0.4 MG SL tablet Place 1 tablet (0.4 mg total) under the tongue every 5 (five) minutes x 3 doses as needed for chest pain. 30 tablet 6   omeprazole (PRILOSEC) 20 MG capsule Take 20 mg by mouth daily as needed (heartburn/indigestion).     Polyvinyl Alcohol-Povidone PF 1.4-0.6 % SOLN Place 1 drop into both eyes daily as needed (dry/irritated eyes.).  rosuvastatin (CRESTOR) 20 MG tablet Take 20 mg by mouth daily in the afternoon.     acetaminophen (TYLENOL) 500 MG tablet Take 500-1,000 mg by mouth every 6 (six) hours as needed (pain.).     No current facility-administered medications on file prior to visit.    PAST MEDICAL HISTORY: Past Medical History:  Diagnosis Date   CAD (coronary artery disease) 01/07/2019   Coronary artery  disease    HTN (hypertension) 01/07/2019   Hypertension    Medical history non-contributory    Myocardial infarction [place](HCC)    STEMI involving left anterior descending coronary artery (HCC) 12/08/2017   Balloon PTCA of the Distal LAD    PAST SURGICAL HISTORY: Past Surgical History:  Procedure Laterality Date   CORONARY/GRAFT ACUTE MI REVASCULARIZATION N/A 12/08/2017   Procedure: Coronary/Graft Acute MI Revascularization;  Surgeon: Yates DecampGanji, Jay, MD;  Location: [facility];  Service: Cardiovascular;  Laterality: N/A;   LEFT HEART CATH AND CORONARY ANGIOGRAPHY N/A 12/08/2017   Procedure: LEFT HEART CATH AND CORONARY ANGIOGRAPHY;  Surgeon: Yates DecampGanji, Jay, MD;  Location: [facility];  Service: Cardiovascular;  Laterality: N/A;   LEFT HEART CATH AND CORONARY ANGIOGRAPHY N/A 11/08/2020   Procedure: LEFT HEART CATH AND CORONARY ANGIOGRAPHY;  Surgeon: Yates DecampGanji, Jay, MD;  Location: [facility];  Service: Cardiovascular;  Laterality: N/A;   TEE WITHOUT CARDIOVERSION N/A 03/25/2018   Procedure: TRANSESOPHAGEAL ECHOCARDIOGRAM (TEE);  Surgeon: Yates DecampGanji, Jay, MD;  [facility];  Service: Cardiovascular;  Laterality: N/A;    FAMILY HISTORY: The patient's family history includes Breast cancer (age of onset: 8240) in her paternal aunt; Breast cancer (age of onset: 7760) in her sister; Diabetes in her father and sister; Heart disease in her mother and sister.   SOCIAL HISTORY:  The patient  reports that she has never smoked. She has never used smokeless tobacco. She reports that she does not drink alcohol and does not use drugs.  Review of Systems  Constitutional: Negative for chills and fever.  HENT:  Negative for ear discharge, ear pain and nosebleeds.   Eyes:  Negative for blurred vision and discharge.  Cardiovascular:  Negative for chest pain, claudication, dyspnea on exertion, leg swelling, near-syncope, orthopnea, palpitations, paroxysmal nocturnal dyspnea and syncope.  Respiratory:   Negative for cough and shortness of breath.   Endocrine: Negative for polydipsia, polyphagia and polyuria.  Hematologic/Lymphatic: Negative for bleeding problem.  Skin:  Negative for flushing and nail changes.  Musculoskeletal:  Negative for muscle cramps and myalgias.  Gastrointestinal:  Negative for abdominal pain, dysphagia, hematemesis, hematochezia, melena, nausea and vomiting.  Neurological:  Negative for dizziness, focal weakness and light-headedness.   PHYSICAL EXAM: Vitals with BMI 11/24/2020 11/08/2020 11/08/2020  Height 5\' 3"  - -  Weight 133 lbs 6 oz - -  BMI 23.64 - -  Systolic 146 - -  Diastolic 79 - -  Pulse 56 25 55   CONSTITUTIONAL: Well-developed and well-nourished. No acute distress.  SKIN: Skin is warm and dry. No rash noted. No cyanosis. No pallor. No jaundice HEAD: Normocephalic and atraumatic.  EYES: No scleral icterus MOUTH/THROAT: Moist oral membranes.  NECK: No JVD present. No thyromegaly noted. No carotid bruits  LYMPHATIC: No visible cervical adenopathy.  CHEST Normal respiratory effort. No intercostal retractions  LUNGS: Clear to auscultation bilaterally.  No stridor. No wheezes. No rales.  CARDIOVASCULAR: Irregularly irregular, positive S1-S2, soft holosystolic murmur heard at the apex, no gallops or rubs. ABDOMINAL: Nonobese, soft, nontender, nondistended, positive bowel sounds  in all 4 quadrants.  No apparent ascites.  EXTREMITIES: No peripheral edema  HEMATOLOGIC: No significant bruising NEUROLOGIC: Oriented to person, place, and time. Nonfocal. Normal muscle tone.  PSYCHIATRIC: Normal mood and affect. Normal behavior. Cooperative  CARDIAC DATABASE: EKG: 12/18/2017: Coarse atrial fibrillation with controlled ventricular response at the rate of 77 bpm, normal axis, anterolateral infarct old with persistent deep T wave inversion in V1 to V6. Abnormal EKG. 06/02/2020: Atrial flutter, 60 bpm, normal axis, nonspecific T wave abnormality.  Compared to prior  EKG dated  09/27/2020: Sinus bradycardia, 54 bpm, normal axis, old anteroseptal infarct, diffuse nonspecific T wave abnormality, low voltage.  Echocardiogram: 07/24/2019:  Left ventricle cavity is normal in size and wall thickness. Normal global wall motion. Normal LV systolic function with EF 70%. Normal diastolic filling pattern.  Left atrial cavity is moderately dilated.  Trileaflet aortic valve.  Trace aortic regurgitation.  Moderate (Grade II) mitral regurgitation.  Moderate tricuspid regurgitation. Estimated pulmonary artery systolic pressure is 35-40 mmHg.  Unlike study in 2019, paravalvular aortic regurgitation is not  appreciated. No other significant change noted.   Stress Testing:  Lexiscan Tetrofosmin Stress Test  08/03/2020: Equivocal ECG stress.  Resting EKG demonstrated normal sinus rhythm. Non-specific T-wave abnormalities. Peak EKG revealed less than 1 mm ST depression present with Lexiscan infusion. Myocardial perfusion is abnormal. There is a reversible mild defect in the apical region. Overall LV systolic function is normal without regional wall motion abnormalities. Stress LV EF: 62%. No previous exam available for comparison. Intermediate risk scan.  Heart Catheterization: Left Heart Catheterization 11/08/20:  LV: LV pressure: 107/0, EDP 5 mmHg.  Aortic pressure 100/49, mean 69 mmHg.  There is no pressure gradient across the aortic valve.  Low normal LV systolic function 50% with mid to distal anterolateral hypokinesis. No mitral regurgitation.   Left main: Long vessel, mild calcification is noted. LAD: Large vessel, mild diffuse coronary calcification is evident.  There is minimal disease in the midsegment at most 20 to 30%.  Previously performed balloon angioplasty site on 12/08/2017 the mid LAD and apical LAD is widely patent. CX: Moderate caliber vessel, smooth and normal.  Mild coronary calcification is evident. RCA: Mid segment has a 30% stenosis.  Otherwise vessel is  smooth.  There is mild calcification noted in the proximal RCA.   Recommendation: Patient has very mild disease and previously performed angioplasty site at the mid LAD and distal LAD on 12/08/2017 is widely patent.  Continue anticoagulation for paroxysmal atrial fibrillation, patient was in coarse atrial fibrillation today during heart catheterization.  30 mill contrast utilized.   LABORATORY DATA: CBC Latest Ref Rng & Units 11/03/2020 10/28/2020 08/10/2019  WBC 3.4 - 10.8 x10E3/uL 5.5 6.2 3.3(L)  Hemoglobin 11.1 - 15.9 g/dL 79.4 80.1 65.5  Hematocrit 34.0 - 46.6 % 36.9 39.6 38.7  Platelets 150 - 450 x10E3/uL 209 177 151    CMP Latest Ref Rng & Units 11/03/2020 10/28/2020 08/31/2019  Glucose 65 - 99 mg/dL 88 374(M) 81  BUN 8 - 27 mg/dL 25 21 24   Creatinine 0.57 - 1.00 mg/dL ) 2.70(B) 8.67(J)  Sodium 134 - 144 mmol/L 142 136 141  Potassium 3.5 - 5.2 mmol/L 4.4 4.1 4.5  Chloride 96 - 106 mmol/L 105 104 107(H)  CO2 20 - 29 mmol/L 24 23 21   Calcium 8.7 - 10.3 mg/dL 9.1 9.2 9.0  Total Protein 6.5 - 8.1 g/dL - 6.6 5.9(L)  Total Bilirubin 0.3 - 1.2 mg/dL - 4.49(E) 0.7  Alkaline Phos 38 -  126 U/L - 95 145(H)  AST 15 - 41 U/L - 28 49(H)  ALT 0 - 44 U/L - 31 66(H)    Lipid Panel     Component Value Date/Time   CHOL 136 08/10/2019 0825   TRIG 36 08/10/2019 0825   HDL 43 08/10/2019 0825   CHOLHDL 3.1 12/09/2017 0616   VLDL 7 12/09/2017 0616   LDLCALC 84 08/10/2019 0825   LABVLDL 9 08/10/2019 0825    Lab Results  Component Value Date   HGBA1C 4.9 12/08/2017   No components found for: NTPROBNP Lab Results  Component Value Date   TSH 3.796 12/09/2017   FINAL MEDICATION LIST END OF ENCOUNTER: No orders of the defined types were placed in this encounter.    Current Outpatient Medications:    apixaban (ELIQUIS) 5 MG TABS tablet, Take 1 tablet (5 mg total) by mouth 2 (two) times daily., Disp: 60 tablet, Rfl: 0   Calcium Carb-Cholecalciferol (CALCIUM 500 + D3 PO), Take 1 tablet by  mouth daily., Disp: , Rfl:    COVID-19 mRNA vaccine, Moderna, 100 MCG/0.5ML injection, Inject into the muscle., Disp: 0.25 mL, Rfl: 0   diclofenac Sodium (VOLTAREN) 1 % GEL, Apply 4 g topically 4 (four) times daily., Disp: 4 g, Rfl: 0   ibandronate (BONIVA) 150 MG tablet, Take 150 mg by mouth every 30 (thirty) days. Take in the morning with a full glass of water, on an empty stomach, and do not take anything else by mouth or lie down for the next 30 min. Takes the first week of the month., Disp: , Rfl:    losartan (COZAAR) 25 MG tablet, TAKE 1 TABLET EVERY EVENING, Disp: 90 tablet, Rfl: 3   metoprolol succinate (TOPROL-XL) 50 MG 24 hr tablet, Take 1 tablet (50 mg total) by mouth daily. Take with or immediately following a meal., Disp: 90 tablet, Rfl: 3   Multiple Vitamin (MULTIVITAMIN WITH MINERALS) TABS tablet, Take 1 tablet by mouth daily., Disp: , Rfl:    nitroGLYCERIN (NITROSTAT) 0.4 MG SL tablet, Place 1 tablet (0.4 mg total) under the tongue every 5 (five) minutes x 3 doses as needed for chest pain., Disp: 30 tablet, Rfl: 6   omeprazole (PRILOSEC) 20 MG capsule, Take 20 mg by mouth daily as needed (heartburn/indigestion)., Disp: , Rfl:    Polyvinyl Alcohol-Povidone PF 1.4-0.6 % SOLN, Place 1 drop into both eyes daily as needed (dry/irritated eyes.)., Disp: , Rfl:    rosuvastatin (CRESTOR) 20 MG tablet, Take 20 mg by mouth daily in the afternoon., Disp: , Rfl:    acetaminophen (TYLENOL) 500 MG tablet, Take 500-1,000 mg by mouth every 6 (six) hours as needed (pain.)., Disp: , Rfl:   IMPRESSION:    ICD-10-CM   1. Coronary artery disease of native artery of native heart with stable angina pectoris (HCC)  I25.118     2. Hx of ST elevation myocardial infarction  I25.2     3. History of coronary angioplasty with insertion of stent  Z95.5     4. Paroxysmal atrial fibrillation (HCC)  I48.0     5. Long term (current) use of anticoagulants  Z79.01     6. Essential hypertension  I10     7.  Mixed hyperlipidemia  E78.2        RECOMMENDATIONS: Aylyn Wenzler is a 70 y.o. female whose past medical history and cardiovascular risk factors include: Hypertension, hyperlipidemia, history of anterior STEMI status post PCI to the mid/distal LAD with balloon angioplasty,  history of atrial fibrillation, postmenopausal female, advanced age.  Coronary artery disease of the native vessels with prior angioplasty and stent with stable angina pectoris: Ischemic evaluation included stress test and left heart catheterization as noted above. Medications reconciled. Has not required sublingual nitroglycerin tablets since last visit. I have asked her to discuss noncardiac causes of epigastric discomfort with the patient's PCP.   If her symptoms continue may consider initiation of Ranexa.  Paroxysmal atrial flutter/fibrillation:  Rate control: Metoprolol. Rhythm control: N/A. Thromboembolic prophylaxis: Eliquis   CHA2DS2-VASc SCORE is 4 which correlates to 4 % risk of stroke per year.   Long-term oral anticoagulation: Does not endorse evidence of bleeding. Risks, benefits, and alternatives to oral anticoagulation discussed with the patient at today's visit. Fall precautions discussed.  History of anterior ST segment elevation MI: See above  Mixed hyperlipidemia: Continue statin therapy.  Cholesterol currently being managed by her primary care provider. Recommend goal LDL less than 70 mg/dL.  No orders of the defined types were placed in this encounter.  --Continue cardiac medications as reconciled in final medication list. --Return in about 6 months (around 05/27/2021) for Follow up, CAD, A. fib/Flutter. Or sooner if needed. --Continue follow-up with your primary care physician regarding the management of your other chronic comorbid conditions.  Patient's questions and concerns were addressed to her satisfaction. She voices understanding of the instructions provided during this  encounter.   This note was created using a voice recognition software as a result there may be grammatical errors inadvertently enclosed that do not reflect the nature of this encounter. Every attempt is made to correct such errors.  [name], [place], [facility]  Pager: [phone] Office: [phone]

## 2020-11-25 ENCOUNTER — Ambulatory Visit: Payer: Medicare Other | Admitting: Cardiology

## 2020-12-09 ENCOUNTER — Other Ambulatory Visit (HOSPITAL_COMMUNITY)
Admission: RE | Admit: 2020-12-09 | Discharge: 2020-12-09 | Disposition: A | Discharge status: Home / Self Care | Payer: BC Managed Care – PPO | Source: Ambulatory Visit | Attending: Family Medicine | Admitting: Family Medicine

## 2020-12-09 ENCOUNTER — Other Ambulatory Visit: Payer: Self-pay | Admitting: Family Medicine

## 2020-12-09 DIAGNOSIS — Z1151 Encounter for screening for human papillomavirus (HPV): Secondary | ICD-10-CM | POA: Diagnosis not present

## 2020-12-09 DIAGNOSIS — Z01419 Encounter for gynecological examination (general) (routine) without abnormal findings: Secondary | ICD-10-CM | POA: Insufficient documentation

## 2020-12-09 DIAGNOSIS — Z0000 Encounter for general adult medical examination without abnormal findings: Secondary | ICD-10-CM | POA: Diagnosis not present

## 2020-12-13 LAB — CYTOLOGY - PAP
Comment: NEGATIVE
Diagnosis: NEGATIVE
High risk HPV: NEGATIVE

## 2020-12-16 ENCOUNTER — Other Ambulatory Visit: Payer: Self-pay

## 2020-12-16 ENCOUNTER — Ambulatory Visit
Admission: RE | Admit: 2020-12-16 | Discharge: 2020-12-16 | Disposition: A | Discharge status: Home / Self Care | Payer: BC Managed Care – PPO | Source: Ambulatory Visit | Attending: Family Medicine | Admitting: Family Medicine

## 2020-12-16 DIAGNOSIS — Z1231 Encounter for screening mammogram for malignant neoplasm of breast: Secondary | ICD-10-CM

## 2021-01-27 ENCOUNTER — Other Ambulatory Visit: Payer: Self-pay

## 2021-01-27 ENCOUNTER — Telehealth: Payer: Self-pay | Admitting: Cardiology

## 2021-01-27 DIAGNOSIS — I484 Atypical atrial flutter: Secondary | ICD-10-CM

## 2021-01-27 MED ORDER — APIXABAN 5 MG PO TABS: 5.0000 mg | ORAL_TABLET | Freq: Two times a day (BID) | ORAL | 0 refills | Status: DC

## 2021-01-27 MED ORDER — METOPROLOL SUCCINATE ER 50 MG PO TB24: 50.0000 mg | ORAL_TABLET | Freq: Every day | ORAL | 3 refills | Status: DC

## 2021-01-27 NOTE — Telephone Encounter (Signed)
[facility]

## 2021-01-27 NOTE — Telephone Encounter (Signed)
Req ref on below meds   metoprolol succinate (TOPROL-XL) 50 MG 24 hr tablet (Expired)  apixaban (ELIQUIS) 5 MG TABS tablet  [facility]

## 2021-01-30 DIAGNOSIS — Z86010 Personal history of colonic polyps: Secondary | ICD-10-CM | POA: Diagnosis not present

## 2021-01-30 DIAGNOSIS — Z7901 Long term (current) use of anticoagulants: Secondary | ICD-10-CM | POA: Diagnosis not present

## 2021-01-30 DIAGNOSIS — R1013 Epigastric pain: Secondary | ICD-10-CM | POA: Diagnosis not present

## 2021-02-23 ENCOUNTER — Telehealth: Payer: Self-pay

## 2021-02-24 NOTE — Telephone Encounter (Signed)
Have her take both metoprolol and losartan at the current prescribed doses.

## 2021-02-24 NOTE — Telephone Encounter (Signed)
I tried calling patient back yesterday afternoon no answer left a vm after speaking to [place], [facility]

## 2021-02-25 DIAGNOSIS — R1013 Epigastric pain: Secondary | ICD-10-CM | POA: Diagnosis not present

## 2021-02-25 DIAGNOSIS — Z86010 Personal history of colonic polyps: Secondary | ICD-10-CM | POA: Diagnosis not present

## 2021-02-25 DIAGNOSIS — Z1211 Encounter for screening for malignant neoplasm of colon: Secondary | ICD-10-CM | POA: Diagnosis not present

## 2021-03-08 ENCOUNTER — Telehealth: Payer: Self-pay | Admitting: Internal Medicine

## 2021-03-08 ENCOUNTER — Telehealth: Payer: Self-pay | Admitting: Cardiology

## 2021-03-08 ENCOUNTER — Other Ambulatory Visit: Payer: Self-pay

## 2021-03-08 DIAGNOSIS — I484 Atypical atrial flutter: Secondary | ICD-10-CM

## 2021-03-08 MED ORDER — METOPROLOL SUCCINATE ER 50 MG PO TB24: 50.0000 mg | ORAL_TABLET | Freq: Every day | ORAL | 3 refills | Status: DC

## 2021-03-08 MED ORDER — APIXABAN 5 MG PO TABS: 5.0000 mg | ORAL_TABLET | Freq: Two times a day (BID) | ORAL | 0 refills | Status: DC

## 2021-03-08 NOTE — Telephone Encounter (Signed)
Refill sent.

## 2021-03-08 NOTE — Telephone Encounter (Signed)
Pt req below meds   metoprolol succinate (TOPROL-XL) 50 MG 24 hr tablet  apixaban (ELIQUIS) 5 MG TABS tablet  [facility]

## 2021-03-13 ENCOUNTER — Other Ambulatory Visit: Payer: Self-pay

## 2021-03-13 ENCOUNTER — Telehealth: Payer: Self-pay | Admitting: Cardiology

## 2021-03-13 DIAGNOSIS — I484 Atypical atrial flutter: Secondary | ICD-10-CM

## 2021-03-13 MED ORDER — METOPROLOL SUCCINATE ER 50 MG PO TB24: 50.0000 mg | ORAL_TABLET | Freq: Every day | ORAL | 3 refills | Status: DC

## 2021-03-13 MED ORDER — APIXABAN 5 MG PO TABS: 5.0000 mg | ORAL_TABLET | Freq: Two times a day (BID) | ORAL | 3 refills | Status: DC

## 2021-04-22 DIAGNOSIS — A048 Other specified bacterial intestinal infections: Secondary | ICD-10-CM | POA: Diagnosis not present

## 2021-04-22 DIAGNOSIS — Z86010 Personal history of colonic polyps: Secondary | ICD-10-CM | POA: Diagnosis not present

## 2021-04-22 DIAGNOSIS — K259 Gastric ulcer, unspecified as acute or chronic, without hemorrhage or perforation: Secondary | ICD-10-CM | POA: Diagnosis not present

## 2021-05-19 DIAGNOSIS — K259 Gastric ulcer, unspecified as acute or chronic, without hemorrhage or perforation: Secondary | ICD-10-CM | POA: Diagnosis not present

## 2021-05-25 ENCOUNTER — Ambulatory Visit: Payer: Self-pay | Admitting: *Deleted

## 2021-05-25 DIAGNOSIS — K2970 Gastritis, unspecified, without bleeding: Secondary | ICD-10-CM | POA: Diagnosis not present

## 2021-05-25 NOTE — Telephone Encounter (Signed)
Third attempt to reach patient- no answer- did leave message for the most up to date information: [email or web address] website

## 2021-05-25 NOTE — Telephone Encounter (Signed)
2nd attempt to reach pt, left VM to call back to discuss concerns.

## 2021-05-25 NOTE — Telephone Encounter (Signed)
Pt wants a fu [phone] re the latest covid vaccine and how relevant is to present virus. If she is not available pls leave a message     Attempted to reach pt, left VM to call back to discuss concerns

## 2021-05-30 ENCOUNTER — Ambulatory Visit: Payer: Medicare Other | Admitting: Cardiology

## 2021-05-30 ENCOUNTER — Other Ambulatory Visit: Payer: Self-pay

## 2021-05-30 ENCOUNTER — Encounter: Payer: Self-pay | Admitting: Cardiology

## 2021-05-30 VITALS — BP 126/76 | HR 59 | Temp 97.6°F | Resp 16 | Ht 63.0 in | Wt 131.0 lb

## 2021-05-30 DIAGNOSIS — I252 Old myocardial infarction: Secondary | ICD-10-CM | POA: Diagnosis not present

## 2021-05-30 DIAGNOSIS — I480 Paroxysmal atrial fibrillation: Secondary | ICD-10-CM

## 2021-05-30 DIAGNOSIS — I4892 Unspecified atrial flutter: Secondary | ICD-10-CM | POA: Diagnosis not present

## 2021-05-30 DIAGNOSIS — I340 Nonrheumatic mitral (valve) insufficiency: Secondary | ICD-10-CM

## 2021-05-30 DIAGNOSIS — E782 Mixed hyperlipidemia: Secondary | ICD-10-CM

## 2021-05-30 DIAGNOSIS — I10 Essential (primary) hypertension: Secondary | ICD-10-CM | POA: Diagnosis not present

## 2021-05-30 DIAGNOSIS — Z955 Presence of coronary angioplasty implant and graft: Secondary | ICD-10-CM

## 2021-05-30 DIAGNOSIS — I25118 Atherosclerotic heart disease of native coronary artery with other forms of angina pectoris: Secondary | ICD-10-CM | POA: Diagnosis not present

## 2021-05-30 DIAGNOSIS — Z7901 Long term (current) use of anticoagulants: Secondary | ICD-10-CM

## 2021-05-30 DIAGNOSIS — I2510 Atherosclerotic heart disease of native coronary artery without angina pectoris: Secondary | ICD-10-CM

## 2021-05-30 NOTE — Progress Notes (Signed)
Suzanne Hunt Date of Birth: May 31, 1950 MRN: [id number] Primary Care Provider:Sun, Gari Crown, MD  Former Cardiologist: Dr. Adrian Prows and Jeri Lager, APRN, FNP-C Primary Cardiologist: Rex Kras, DO (established care 07/16/2019)  Date: 05/30/21 Last Office Visit: 11/24/2020.   Chief Complaint  Patient presents with   Atrial Fibrillation   Coronary Artery Disease   Follow-up   HPI  Suzanne Hunt is a 71 y.o.  female who presents to the office with a chief complaint of " 6 months follow-up for paroxysmal atrial fibrillation/flutter and CAD." Patient's past medical history and cardiovascular risk factors include: Hypertension, hyperlipidemia, history of anterior STEMI status post PCI to the mid/distal LAD with balloon angioplasty, history of atrial fibrillation, paroxysmal atrial flutter (EKG 07/16/2019), postmenopausal female, advanced age.   Patient has history of anterior STEMI status post angioplasty and PCI to the mid/distal LAD and her anginal equivalent as epigastric discomfort.  During her prior office visit she was having reoccurrence of epigastric discomfort and as a result underwent stress testing.  The stress test was reported to be intermediate risk and after up titration of medical therapy she continued to have symptoms requiring frequent sublingual nitroglycerin tablets and therefore was scheduled for a left heart catheterization.  The report was reviewed as part of today's office visit and decision-making process.  Since last office visit she has not had any reoccurrence of anginal discomfort.  No hospitalizations or urgent care visits for cardiovascular symptoms.  No use of sublingual nitroglycerin tablets.  And overall functional status remains relatively stable.  FUNCTIONAL STATUS: Goes to the gym on a regular basis.  She ambulates at least 1 mile each time and up to 3 miles every Saturday.  She does resistance training regularly.  ALLERGIES: Allergies   Allergen Reactions   Actinel [Pseudoephedrine-Dm-Gg] Other (See Comments)    Flu like symptoms   MEDICATION LIST PRIOR TO VISIT: Current Outpatient Medications on File Prior to Visit  Medication Sig Dispense Refill   apixaban (ELIQUIS) 5 MG TABS tablet Take 1 tablet (5 mg total) by mouth 2 (two) times daily. 180 tablet 3   Calcium Carb-Cholecalciferol (CALCIUM 500 + D3 PO) Take 1 tablet by mouth daily.     ibandronate (BONIVA) 150 MG tablet Take 150 mg by mouth every 30 (thirty) days. Take in the morning with a full glass of water, on an empty stomach, and do not take anything else by mouth or lie down for the next 30 min. Takes the first week of the month.     losartan (COZAAR) 25 MG tablet TAKE 1 TABLET EVERY EVENING 90 tablet 3   metoprolol succinate (TOPROL-XL) 50 MG 24 hr tablet Take 1 tablet (50 mg total) by mouth daily. Take with or immediately following a meal. 90 tablet 3   Multiple Vitamin (MULTIVITAMIN WITH MINERALS) TABS tablet Take 1 tablet by mouth daily.     nitroGLYCERIN (NITROSTAT) 0.4 MG SL tablet Place 1 tablet (0.4 mg total) under the tongue every 5 (five) minutes x 3 doses as needed for chest pain. 30 tablet 6   omeprazole (PRILOSEC) 20 MG capsule Take 20 mg by mouth daily as needed (heartburn/indigestion).     Polyvinyl Alcohol-Povidone PF 1.4-0.6 % SOLN Place 1 drop into both eyes daily as needed (dry/irritated eyes.).     rosuvastatin (CRESTOR) 20 MG tablet Take 20 mg by mouth daily in the afternoon.     No current facility-administered medications on file prior to visit.    PAST MEDICAL HISTORY:  Past Medical History:  Diagnosis Date   CAD (coronary artery disease) 01/07/2019   Coronary artery disease    HTN (hypertension) 01/07/2019   Hypertension    Medical history non-contributory    Myocardial infarction [facility])    STEMI involving left anterior descending coronary artery ([place]) 12/08/2017   Balloon PTCA of the Distal LAD    PAST SURGICAL HISTORY: Past  Surgical History:  Procedure Laterality Date   CORONARY/GRAFT ACUTE MI REVASCULARIZATION N/A 12/08/2017   Procedure: Coronary/Graft Acute MI Revascularization;  Surgeon: Adrian Prows, MD;  Location: [place] [facility];  Service: Cardiovascular;  Laterality: N/A;   LEFT HEART CATH AND CORONARY ANGIOGRAPHY N/A 12/08/2017   Procedure: LEFT HEART CATH AND CORONARY ANGIOGRAPHY;  Surgeon: Adrian Prows, MD;  Location: [place] [facility];  Service: Cardiovascular;  Laterality: N/A;   LEFT HEART CATH AND CORONARY ANGIOGRAPHY N/A 11/08/2020   Procedure: LEFT HEART CATH AND CORONARY ANGIOGRAPHY;  Surgeon: Adrian Prows, MD;  Location: [place] [facility];  Service: Cardiovascular;  Laterality: N/A;   TEE WITHOUT CARDIOVERSION N/A 03/25/2018   Procedure: TRANSESOPHAGEAL ECHOCARDIOGRAM (TEE);  Surgeon: Adrian Prows, MD;  Location: [facility] ENDOSCOPY;  Service: Cardiovascular;  Laterality: N/A;    FAMILY HISTORY: The patient's family history includes Breast cancer (age of onset: 25) in her paternal aunt; Breast cancer (age of onset: 59) in her sister; Diabetes in her father and sister; Heart disease in her mother and sister.   SOCIAL HISTORY:  The patient  reports that she has never smoked. She has never used smokeless tobacco. She reports that she does not drink alcohol and does not use drugs.  Review of Systems  Constitutional: Negative for chills and fever.  HENT:  Negative for ear discharge, ear pain and nosebleeds.   Eyes:  Negative for blurred vision and discharge.  Cardiovascular:  Negative for chest pain, claudication, dyspnea on exertion, leg swelling, near-syncope, orthopnea, palpitations, paroxysmal nocturnal dyspnea and syncope.  Respiratory:  Negative for cough and shortness of breath.   Endocrine: Negative for polydipsia, polyphagia and polyuria.  Hematologic/Lymphatic: Negative for bleeding problem.  Skin:  Negative for flushing and nail changes.  Musculoskeletal:  Negative for muscle cramps and myalgias.   Gastrointestinal:  Negative for abdominal pain, dysphagia, hematemesis, hematochezia, melena, nausea and vomiting.  Neurological:  Negative for dizziness, focal weakness and light-headedness.   PHYSICAL EXAM: Vitals with BMI 05/30/2021 11/24/2020 11/08/2020  Height 5\' 3"  5\' 3"  -  Weight 131 lbs 133 lbs 6 oz -  BMI [id number] 0000000 -  Systolic [id number] [id number] -  Diastolic 76 79 -  Pulse 59 56 25   CONSTITUTIONAL: Well-developed and well-nourished. No acute distress.  SKIN: Skin is warm and dry. No rash noted. No cyanosis. No pallor. No jaundice HEAD: Normocephalic and atraumatic.  EYES: No scleral icterus MOUTH/THROAT: Moist oral membranes.  NECK: No JVD present. No thyromegaly noted. No carotid bruits  LYMPHATIC: No visible cervical adenopathy.  CHEST Normal respiratory effort. No intercostal retractions  LUNGS: Clear to auscultation bilaterally.  No stridor. No wheezes. No rales.  CARDIOVASCULAR: Regular, bradycardic, positive Q000111Q, soft holosystolic murmur heard at the apex, no gallops or rubs. ABDOMINAL: Nonobese, soft, nontender, nondistended, positive bowel sounds in all 4 quadrants.  No apparent ascites.  EXTREMITIES: No peripheral edema  HEMATOLOGIC: No significant bruising NEUROLOGIC: Oriented to person, place, and time. Nonfocal. Normal muscle tone.  PSYCHIATRIC: Normal mood and affect. Normal behavior. Cooperative  CARDIAC DATABASE: EKG: 12/18/2017: Coarse atrial fibrillation with controlled ventricular response at the  rate of 77 bpm, normal axis, anterolateral infarct old with persistent deep T wave inversion in V1 to V6. Abnormal EKG. 06/02/2020: Atrial flutter, 60 bpm, normal axis, nonspecific T wave abnormality.  Compared to prior EKG dated  05/30/2021: Sinus bradycardia, old anteroseptal infarct, without underlying injury pattern.   Echocardiogram: 07/24/2019:  Left ventricle cavity is normal in size and wall thickness. Normal global wall motion. Normal LV systolic function with EF  70%. Normal diastolic filling pattern.  Left atrial cavity is moderately dilated.  Trileaflet aortic valve.  Trace aortic regurgitation.  Moderate (Grade II) mitral regurgitation.  Moderate tricuspid regurgitation. Estimated pulmonary artery systolic pressure is [id number] mmHg.  Unlike study in 2019, paravalvular aortic regurgitation is not  appreciated. No other significant change noted.   Stress Testing:  Lexiscan Tetrofosmin Stress Test  08/03/2020: Equivocal ECG stress.  Resting EKG demonstrated normal sinus rhythm. Non-specific T-wave abnormalities. Peak EKG revealed less than 1 mm ST depression present with Lexiscan infusion. Myocardial perfusion is abnormal. There is a reversible mild defect in the apical region. Overall LV systolic function is normal without regional wall motion abnormalities. Stress LV EF: 62%. No previous exam available for comparison. Intermediate risk scan.  Heart Catheterization: Left Heart Catheterization 11/08/20:  LV: LV pressure: 107/0, EDP 5 mmHg.  Aortic pressure 100/49, mean 69 mmHg.  There is no pressure gradient across the aortic valve.  Low normal LV systolic function [id number] with mid to distal anterolateral hypokinesis. No mitral regurgitation.   Left main: Long vessel, mild calcification is noted. LAD: Large vessel, mild diffuse coronary calcification is evident.  There is minimal disease in the midsegment at most 20 to 30%.  Previously performed balloon angioplasty site on 12/08/2017 the mid LAD and apical LAD is widely patent. CX: Moderate caliber vessel, smooth and normal.  Mild coronary calcification is evident. RCA: Mid segment has a 30% stenosis.  Otherwise vessel is smooth.  There is mild calcification noted in the proximal RCA.   Recommendation: Patient has very mild disease and previously performed angioplasty site at the mid LAD and distal LAD on 12/08/2017 is widely patent.  Continue anticoagulation for paroxysmal atrial fibrillation, patient was in  coarse atrial fibrillation today during heart catheterization.  30 mill contrast utilized.   LABORATORY DATA: CBC Latest Ref Rng & Units 11/03/2020 10/28/2020 08/10/2019  WBC 3.4 - 10.8 x10E3/uL 5.5 6.2 3.3(L)  Hemoglobin 11.1 - 15.9 g/dL 12.3 12.7 12.7  Hematocrit 34.0 - 46.6 % 36.9 39.6 38.7  Platelets 150 - 450 x10E3/uL 209 177 151    CMP Latest Ref Rng & Units 11/03/2020 10/28/2020 08/31/2019  Glucose 65 - 99 mg/dL 88 117(H) 81  BUN 8 - 27 mg/dL 25 21 24   Creatinine 0.57 - 1.00 mg/dL 1.08(H) 1.10(H) 1.14(H)  Sodium 134 - 144 mmol/L 142 136 141  Potassium 3.5 - 5.2 mmol/L 4.4 4.1 4.5  Chloride 96 - 106 mmol/L 105 104 107(H)  CO2 20 - 29 mmol/L 24 23 21   Calcium 8.7 - 10.3 mg/dL 9.1 9.2 9.0  Total Protein 6.5 - 8.1 g/dL - 6.6 5.9(L)  Total Bilirubin 0.3 - 1.2 mg/dL - 1.4(H) 0.7  Alkaline Phos 38 - 126 U/L - 95 145(H)  AST 15 - 41 U/L - 28 49(H)  ALT 0 - 44 U/L - 31 66(H)    Lipid Panel  Lab Results  Component Value Date   CHOL 136 08/10/2019   HDL 43 08/10/2019   LDLCALC 84 08/10/2019   TRIG 36 08/10/2019  CHOLHDL 3.1 12/09/2017     Lab Results  Component Value Date   HGBA1C 4.9 12/08/2017   No components found for: NTPROBNP Lab Results  Component Value Date   TSH 3.796 12/09/2017   External Labs: Collected: 11/17/2020 available in Care Everywhere performed at [place]. Hemoglobin 13.1 g/dL, hematocrit 39.6%. BUN 20, creatinine 1.14. Sodium 139, potassium 4.3, chloride 106, bicarb 27, AST 27, ALT 31, alkaline phosphatase 103 Total cholesterol 147, triglycerides 46, LDL 87, non-HDL 98.  FINAL MEDICATION LIST END OF ENCOUNTER: No orders of the defined types were placed in this encounter.     Current Outpatient Medications:    apixaban (ELIQUIS) 5 MG TABS tablet, Take 1 tablet (5 mg total) by mouth 2 (two) times daily., Disp: 180 tablet, Rfl: 3   Calcium Carb-Cholecalciferol (CALCIUM 500 + D3 PO), Take 1 tablet by mouth daily., Disp: , Rfl:    ibandronate  (BONIVA) 150 MG tablet, Take 150 mg by mouth every 30 (thirty) days. Take in the morning with a full glass of water, on an empty stomach, and do not take anything else by mouth or lie down for the next 30 min. Takes the first week of the month., Disp: , Rfl:    losartan (COZAAR) 25 MG tablet, TAKE 1 TABLET EVERY EVENING, Disp: 90 tablet, Rfl: 3   metoprolol succinate (TOPROL-XL) 50 MG 24 hr tablet, Take 1 tablet (50 mg total) by mouth daily. Take with or immediately following a meal., Disp: 90 tablet, Rfl: 3   Multiple Vitamin (MULTIVITAMIN WITH MINERALS) TABS tablet, Take 1 tablet by mouth daily., Disp: , Rfl:    nitroGLYCERIN (NITROSTAT) 0.4 MG SL tablet, Place 1 tablet (0.4 mg total) under the tongue every 5 (five) minutes x 3 doses as needed for chest pain., Disp: 30 tablet, Rfl: 6   omeprazole (PRILOSEC) 20 MG capsule, Take 20 mg by mouth daily as needed (heartburn/indigestion)., Disp: , Rfl:    Polyvinyl Alcohol-Povidone PF 1.4-0.6 % SOLN, Place 1 drop into both eyes daily as needed (dry/irritated eyes.)., Disp: , Rfl:    rosuvastatin (CRESTOR) 20 MG tablet, Take 20 mg by mouth daily in the afternoon., Disp: , Rfl:   IMPRESSION:    ICD-10-CM   1. Coronary artery disease involving native coronary artery of native heart without angina pectoris  I25.10 EKG 12-Lead    2. Hx of ST elevation myocardial infarction  I25.2     3. History of coronary angioplasty with insertion of stent  Z95.5     4. Paroxysmal atrial fibrillation (HCC)  I48.0     5. Paroxysmal atrial flutter (HCC)  I48.92     6. Long term (current) use of anticoagulants  Z79.01     7. Nonrheumatic mitral valve regurgitation  I34.0 PCV ECHOCARDIOGRAM COMPLETE    8. Essential hypertension  I10     9. Mixed hyperlipidemia  E78.2        RECOMMENDATIONS: Urmila Zablocki is a 71 y.o. female whose past medical history and cardiovascular risk factors include: Hypertension, hyperlipidemia, history of anterior STEMI status  post PCI to the mid/distal LAD with balloon angioplasty, history of atrial fibrillation, postmenopausal female, advanced age.  Coronary artery disease of native artery of native heart without angina pectoris ([place]), Hx of Anterior STEMI, S/p angioplasty and stenting: Denies angina pectoris No use of sublingual nitroglycerin tablets since last visit. Medications reconciled. Reviewed the last echocardiog 1 telescope ram and left heart catheterization report as part of today's office visit. Outside labs  independently reviewed from August 2022 available in [place]. LDL currently not at goal.  Recommend Zetia or PCSK9 inhibitor; however, patient refuses additional medications at this time.  Paroxysmal atrial fibrillation /flutter [facility]) Rate control: Metoprolol. Rhythm control: N/A. Thromboembolic prophylaxis: Eliquis CHA2DS2-VASc SCORE is 4 which correlates to 4% risk of stroke per year. Outside labs from August 2022 independently reviewed which notes stable renal and hemoglobin levels She does not endorse evidence of bleeding. Reemphasized the risks, benefits, and alternatives to oral anticoagulation.  Nonrheumatic mitral valve regurgitation Plan echocardiogram prior to the next office visit to reevaluate MR.   Essential hypertension Blood pressure is stable.  Medications reconciled.   Mixed hyperlipidemia Currently on Crestor.   She denies myalgia or other side effects. Most recent lipids dated August 2022 reviewed as noted above. Currently managed by primary care provider.  Orders Placed This Encounter  Procedures   EKG 12-Lead   PCV ECHOCARDIOGRAM COMPLETE    --Continue cardiac medications as reconciled in final medication list. --Return in about 6 months (around 11/27/2021) for Follow up, CAD. Or sooner if needed. --Continue follow-up with your primary care physician regarding the management of your other chronic comorbid conditions.  Patient's questions and concerns  were addressed to her satisfaction. She voices understanding of the instructions provided during this encounter.   This note was created using a voice recognition software as a result there may be grammatical errors inadvertently enclosed that do not reflect the nature of this encounter. Every attempt is made to correct such errors.  Rex Kras, [place], [facility]  Pager: [phone] Office: [phone]

## 2021-06-05 DIAGNOSIS — H04123 Dry eye syndrome of bilateral lacrimal glands: Secondary | ICD-10-CM | POA: Diagnosis not present

## 2021-06-05 DIAGNOSIS — H40013 Open angle with borderline findings, low risk, bilateral: Secondary | ICD-10-CM | POA: Diagnosis not present

## 2021-06-05 DIAGNOSIS — H524 Presbyopia: Secondary | ICD-10-CM | POA: Diagnosis not present

## 2021-06-05 DIAGNOSIS — H25813 Combined forms of age-related cataract, bilateral: Secondary | ICD-10-CM | POA: Diagnosis not present

## 2021-07-13 ENCOUNTER — Other Ambulatory Visit: Payer: Self-pay | Admitting: Cardiology

## 2021-09-15 ENCOUNTER — Other Ambulatory Visit: Payer: Self-pay | Admitting: Cardiology

## 2021-11-08 ENCOUNTER — Other Ambulatory Visit: Payer: Self-pay | Admitting: Family Medicine

## 2021-11-08 DIAGNOSIS — Z1231 Encounter for screening mammogram for malignant neoplasm of breast: Secondary | ICD-10-CM

## 2021-11-15 ENCOUNTER — Other Ambulatory Visit: Payer: Medicare PPO

## 2021-11-23 ENCOUNTER — Other Ambulatory Visit: Payer: Medicare PPO

## 2021-11-30 ENCOUNTER — Ambulatory Visit: Payer: Medicare PPO | Admitting: Cardiology

## 2021-12-11 LAB — LAB REPORT - SCANNED: EGFR: 53

## 2021-12-12 ENCOUNTER — Other Ambulatory Visit: Payer: Self-pay | Admitting: Family Medicine

## 2021-12-12 DIAGNOSIS — M810 Age-related osteoporosis without current pathological fracture: Secondary | ICD-10-CM

## 2021-12-19 ENCOUNTER — Ambulatory Visit
Admission: RE | Admit: 2021-12-19 | Discharge: 2021-12-19 | Disposition: A | Discharge status: Home / Self Care | Payer: Medicare PPO | Source: Ambulatory Visit | Attending: Family Medicine | Admitting: Family Medicine

## 2021-12-19 DIAGNOSIS — Z1231 Encounter for screening mammogram for malignant neoplasm of breast: Secondary | ICD-10-CM

## 2021-12-21 ENCOUNTER — Ambulatory Visit: Payer: Medicare PPO

## 2021-12-21 DIAGNOSIS — I340 Nonrheumatic mitral (valve) insufficiency: Secondary | ICD-10-CM

## 2021-12-28 ENCOUNTER — Ambulatory Visit: Payer: Medicare PPO | Admitting: Cardiology

## 2021-12-28 ENCOUNTER — Encounter: Payer: Self-pay | Admitting: Cardiology

## 2021-12-28 VITALS — BP 114/63 | HR 66 | Temp 96.2°F | Resp 16 | Ht 63.0 in | Wt 124.8 lb

## 2021-12-28 DIAGNOSIS — I10 Essential (primary) hypertension: Secondary | ICD-10-CM

## 2021-12-28 DIAGNOSIS — I4892 Unspecified atrial flutter: Secondary | ICD-10-CM

## 2021-12-28 DIAGNOSIS — I2510 Atherosclerotic heart disease of native coronary artery without angina pectoris: Secondary | ICD-10-CM

## 2021-12-28 DIAGNOSIS — Z955 Presence of coronary angioplasty implant and graft: Secondary | ICD-10-CM

## 2021-12-28 DIAGNOSIS — I252 Old myocardial infarction: Secondary | ICD-10-CM

## 2021-12-28 DIAGNOSIS — Z7901 Long term (current) use of anticoagulants: Secondary | ICD-10-CM

## 2021-12-28 DIAGNOSIS — E782 Mixed hyperlipidemia: Secondary | ICD-10-CM

## 2021-12-28 NOTE — Progress Notes (Signed)
Suzanne Hunt Date of Birth: 06/12/1950 MRN: [id number] Primary Care Provider:Sun, Charise Carwin, MD  Former Cardiologist: Dr. Yates Decamp and Altamese , APRN, FNP-C Primary Cardiologist: [name], DO (established care 07/16/2019)  Date: 12/28/21 Last Office Visit: 05/30/2021  Chief Complaint  Patient presents with   Coronary Artery Disease   Follow-up   HPI  Suzanne Hunt is a 71 y.o.  female whose past medical history and cardiovascular risk factors include: Hypertension, hyperlipidemia, history of anterior STEMI status post PCI to the mid/distal LAD with balloon angioplasty, history of atrial fibrillation, paroxysmal atrial flutter, postmenopausal female, advanced age.  Has history of anterior STEMI back in August 2019 and underwent angioplasty/PCI to the mid/distal LAD.  She also has history of paroxysmal atrial flutter/coarse atrial fibrillation.  She now presents for 53-month follow-up visit.  Clinically patient states that she feels tired and fatigued but denies angina pectoris or heart failure symptoms.  She goes to the gym regularly without any significant change in physical endurance.  Patient states that she recently had labs with PCP and was told to uptitrate statin therapy but states " I will not do so."  I do not have those labs available for review.  Patient does not endorse evidence of bleeding.  No use of sublingual nitroglycerin tablets since last office encounter.  FUNCTIONAL STATUS: Goes to the gym on a regular basis.  She ambulates at least 1 mile each time and up to 3 miles every Saturday.  She does resistance training regularly.  ALLERGIES: Allergies  Allergen Reactions   Actinel [Pseudoephedrine-Dm-Gg] Other (See Comments)    Flu like symptoms   MEDICATION LIST PRIOR TO VISIT: Current Outpatient Medications on File Prior to Visit  Medication Sig Dispense Refill   apixaban (ELIQUIS) 5 MG TABS tablet Take 1 tablet (5 mg total) by mouth 2  (two) times daily. 180 tablet 3   Calcium Carb-Cholecalciferol (CALCIUM 500 + D3 PO) Take 1 tablet by mouth daily.     ibandronate (BONIVA) 150 MG tablet Take 150 mg by mouth every 30 (thirty) days. Take in the morning with a full glass of water, on an empty stomach, and do not take anything else by mouth or lie down for the next 30 min. Takes the first week of the month.     losartan (COZAAR) 25 MG tablet TAKE 1 TABLET EVERY EVENING 90 tablet 3   metoprolol succinate (TOPROL-XL) 50 MG 24 hr tablet Take 1 tablet (50 mg total) by mouth daily. Take with or immediately following a meal. 90 tablet 3   Multiple Vitamin (MULTIVITAMIN WITH MINERALS) TABS tablet Take 1 tablet by mouth daily.     nitroGLYCERIN (NITROSTAT) 0.4 MG SL tablet Place 1 tablet (0.4 mg total) under the tongue every 5 (five) minutes x 3 doses as needed for chest pain. 30 tablet 6   omeprazole (PRILOSEC) 20 MG capsule Take 20 mg by mouth daily as needed (heartburn/indigestion).     Polyvinyl Alcohol-Povidone PF 1.4-0.6 % SOLN Place 1 drop into both eyes daily as needed (dry/irritated eyes.).     rosuvastatin (CRESTOR) 20 MG tablet TAKE 1 TABLET DAILY AT 6 P.M. (NEED APPOINTMENT FOR ADDITIONAL REFILLS) 30 tablet 11   No current facility-administered medications on file prior to visit.    PAST MEDICAL HISTORY: Past Medical History:  Diagnosis Date   CAD (coronary artery disease) 01/07/2019   Coronary artery disease    HTN (hypertension) 01/07/2019   Hypertension    Medical history non-contributory  Myocardial infarction [facility])    STEMI involving left anterior descending coronary artery (HCC) 12/08/2017   Balloon PTCA of the Distal LAD    PAST SURGICAL HISTORY: Past Surgical History:  Procedure Laterality Date   CORONARY/GRAFT ACUTE MI REVASCULARIZATION N/A 12/08/2017   Procedure: Coronary/Graft Acute MI Revascularization;  Surgeon: Yates Decamp, MD;  Location: [facility] INVASIVE CV LAB;  Service: Cardiovascular;  Laterality: N/A;    LEFT HEART CATH AND CORONARY ANGIOGRAPHY N/A 12/08/2017   Procedure: LEFT HEART CATH AND CORONARY ANGIOGRAPHY;  Surgeon: Yates Decamp, MD;  Location: [facility] INVASIVE CV LAB;  Service: Cardiovascular;  Laterality: N/A;   LEFT HEART CATH AND CORONARY ANGIOGRAPHY N/A 11/08/2020   Procedure: LEFT HEART CATH AND CORONARY ANGIOGRAPHY;  Surgeon: Yates Decamp, MD;  Location: [facility] INVASIVE CV LAB;  Service: Cardiovascular;  Laterality: N/A;   TEE WITHOUT CARDIOVERSION N/A 03/25/2018   Procedure: TRANSESOPHAGEAL ECHOCARDIOGRAM (TEE);  Surgeon: Yates Decamp, MD;  Location: [facility] ENDOSCOPY;  Service: Cardiovascular;  Laterality: N/A;    FAMILY HISTORY: The patient's family history includes Breast cancer (age of onset: 97) in her paternal aunt; Breast cancer (age of onset: 55) in her sister; Diabetes in her father and sister; Heart disease in her mother and sister.   SOCIAL HISTORY:  The patient  reports that she has never smoked. She has never used smokeless tobacco. She reports that she does not drink alcohol and does not use drugs.  Review of Systems  Constitutional: Negative for chills and fever.  HENT:  Negative for ear discharge, ear pain and nosebleeds.   Eyes:  Negative for blurred vision and discharge.  Cardiovascular:  Negative for chest pain, claudication, dyspnea on exertion, leg swelling, near-syncope, orthopnea, palpitations, paroxysmal nocturnal dyspnea and syncope.  Respiratory:  Negative for cough and shortness of breath.   Endocrine: Negative for polydipsia, polyphagia and polyuria.  Hematologic/Lymphatic: Negative for bleeding problem.  Skin:  Negative for flushing and nail changes.  Musculoskeletal:  Negative for muscle cramps and myalgias.  Gastrointestinal:  Negative for abdominal pain, dysphagia, hematemesis, hematochezia, melena, nausea and vomiting.  Neurological:  Negative for dizziness, focal weakness and light-headedness.    PHYSICAL EXAM:    12/28/2021    3:13 PM 05/30/2021    1:33 PM  11/24/2020    3:46 PM  Vitals with BMI  Height 5\' 3"  5\' 3"  5\' 3"   Weight 124 lbs 13 oz 131 lbs 133 lbs 6 oz  BMI 22.11 23.21 23.64  Systolic 114 126  Diastolic 63 76 79  Pulse 66 59 56   Physical Exam  Constitutional: No distress.  Age appropriate, hemodynamically stable.   Neck: No JVD present.  Cardiovascular: Normal rate, regular rhythm, S1 normal, S2 normal, intact distal pulses and normal pulses. Exam reveals no gallop, no S3 and no S4.  No murmur heard. Pulmonary/Chest: Effort normal and breath sounds normal. No stridor. She has no wheezes. She has no rales.  Abdominal: Soft. Bowel sounds are normal. She exhibits no distension. There is no abdominal tenderness.  Musculoskeletal:        General: No edema.     Cervical back: Neck supple.  Neurological: She is alert and oriented to person, place, and time. She has intact cranial nerves (2-12).  Skin: Skin is warm and moist.   CARDIAC DATABASE: EKG: 12/18/2017: Coarse atrial fibrillation with controlled ventricular response at the rate of 77 bpm, normal axis, anterolateral infarct old with persistent deep T wave inversion in V1 to V6. Abnormal EKG. 06/02/2020: Atrial flutter,  60 bpm, normal axis, nonspecific T wave abnormality.  Compared to prior EKG dated  05/30/2021: Sinus bradycardia, old anteroseptal infarct, without underlying injury pattern.  12/28/2021: Atrial flutter, 62 bpm, without underlying injury pattern.  Echocardiogram: 12/21/2021:  Normal LV systolic function with visual EF 55-60%. Left ventricle cavity is normal in size. Normal left ventricular wall thickness. Normal global wall motion. Unable to evaluate diastolic function due to atrial flutter. Normal LAP.  Trace aortic regurgitation.  Moderate (Grade II) mitral regurgitation.  Mild tricuspid regurgitation. No evidence of pulmonary hypertension.  Compared to 07/24/2019: MR remains stable, moderate LAE is now normal, otherwise no significant change.   Stress  Testing:  Lexiscan Tetrofosmin Stress Test  08/03/2020: Equivocal ECG stress.  Resting EKG demonstrated normal sinus rhythm. Non-specific T-wave abnormalities. Peak EKG revealed less than 1 mm ST depression present with Lexiscan infusion. Myocardial perfusion is abnormal. There is a reversible mild defect in the apical region. Overall LV systolic function is normal without regional wall motion abnormalities. Stress LV EF: 62%. No previous exam available for comparison. Intermediate risk scan.  Heart Catheterization: Left Heart Catheterization 11/08/20:  LV: LV pressure: 107/0, EDP 5 mmHg.  Aortic pressure 100/49, mean 69 mmHg.  There is no pressure gradient across the aortic valve.  Low normal LV systolic function 50% with mid to distal anterolateral hypokinesis. No mitral regurgitation.   Left main: Long vessel, mild calcification is noted. LAD: Large vessel, mild diffuse coronary calcification is evident.  There is minimal disease in the midsegment at most 20 to 30%.  Previously performed balloon angioplasty site on 12/08/2017 the mid LAD and apical LAD is widely patent. CX: Moderate caliber vessel, smooth and normal.  Mild coronary calcification is evident. RCA: Mid segment has a 30% stenosis.  Otherwise vessel is smooth.  There is mild calcification noted in the proximal RCA.   Recommendation: Patient has very mild disease and previously performed angioplasty site at the mid LAD and distal LAD on 12/08/2017 is widely patent.  Continue anticoagulation for paroxysmal atrial fibrillation, patient was in coarse atrial fibrillation today during heart catheterization.  30 mill contrast utilized.   LABORATORY DATA:    Latest Ref Rng & Units 11/03/2020    8:09 AM 10/28/2020   10:53 AM 08/10/2019    8:25 AM  CBC  WBC 3.4 - 10.8 x10E3/uL 5.5  6.2  3.3   Hemoglobin 11.1 - 15.9 g/dL 26.7  12.4  58.0   Hematocrit 34.0 - 46.6 % 36.9  39.6  38.7   Platelets 150 - 450 x10E3/uL 209  177  151         Latest Ref Rng & Units 11/03/2020    8:09 AM 10/28/2020   10:53 AM 08/31/2019    8:15 AM  CMP  Glucose 65 - 99 mg/dL 88  998  81   BUN 8 - 27 mg/dL 25  21  24    Creatinine 0.57 - 1.00 mg/dL  3.38  2.50   Sodium 134 - 144 mmol/L 142  136  141   Potassium 3.5 - 5.2 mmol/L 4.4  4.1  4.5   Chloride 96 - 106 mmol/L 105  104  107   CO2 20 - 29 mmol/L 24  23  21    Calcium 8.7 - 10.3 mg/dL 9.1  9.2  9.0   Total Protein 6.5 - 8.1 g/dL  6.6  5.9   Total Bilirubin 0.3 - 1.2 mg/dL  1.4  0.7   Alkaline Phos 38 - 126  U/L  95  145   AST 15 - 41 U/L  28  49   ALT 0 - 44 U/L  31  66     Lipid Panel  Lab Results  Component Value Date   CHOL 136 08/10/2019   HDL 43 08/10/2019   LDLCALC 84 08/10/2019   TRIG 36 08/10/2019   CHOLHDL 3.1 12/09/2017     Lab Results  Component Value Date   HGBA1C 4.9 12/08/2017   No components found for: "NTPROBNP" Lab Results  Component Value Date   TSH 3.796 12/09/2017   External Labs: Collected: 11/17/2020 available in Care Everywhere performed at [facility] physicians. Hemoglobin 13.1 g/dL, hematocrit 36.1%. BUN 20, creatinine 1.14. Sodium 139, potassium 4.3, chloride 106, bicarb 27, AST 27, ALT 31, alkaline phosphatase 103 Total cholesterol 147, triglycerides 46, LDL 87, non-HDL 98.  FINAL MEDICATION LIST END OF ENCOUNTER: No orders of the defined types were placed in this encounter.     Current Outpatient Medications:    apixaban (ELIQUIS) 5 MG TABS tablet, Take 1 tablet (5 mg total) by mouth 2 (two) times daily., Disp: 180 tablet, Rfl: 3   Calcium Carb-Cholecalciferol (CALCIUM 500 + D3 PO), Take 1 tablet by mouth daily., Disp: , Rfl:    ibandronate (BONIVA) 150 MG tablet, Take 150 mg by mouth every 30 (thirty) days. Take in the morning with a full glass of water, on an empty stomach, and do not take anything else by mouth or lie down for the next 30 min. Takes the first week of the month., Disp: , Rfl:    losartan (COZAAR) 25 MG tablet, TAKE 1 TABLET  EVERY EVENING, Disp: 90 tablet, Rfl: 3   metoprolol succinate (TOPROL-XL) 50 MG 24 hr tablet, Take 1 tablet (50 mg total) by mouth daily. Take with or immediately following a meal., Disp: 90 tablet, Rfl: 3   Multiple Vitamin (MULTIVITAMIN WITH MINERALS) TABS tablet, Take 1 tablet by mouth daily., Disp: , Rfl:    nitroGLYCERIN (NITROSTAT) 0.4 MG SL tablet, Place 1 tablet (0.4 mg total) under the tongue every 5 (five) minutes x 3 doses as needed for chest pain., Disp: 30 tablet, Rfl: 6   omeprazole (PRILOSEC) 20 MG capsule, Take 20 mg by mouth daily as needed (heartburn/indigestion)., Disp: , Rfl:    Polyvinyl Alcohol-Povidone PF 1.4-0.6 % SOLN, Place 1 drop into both eyes daily as needed (dry/irritated eyes.)., Disp: , Rfl:    rosuvastatin (CRESTOR) 20 MG tablet, TAKE 1 TABLET DAILY AT 6 P.M. (NEED APPOINTMENT FOR ADDITIONAL REFILLS), Disp: 30 tablet, Rfl: 11  IMPRESSION:    ICD-10-CM   1. Coronary artery disease involving native coronary artery of native heart without angina pectoris  I25.10 EKG 12-Lead    2. Hx of ST elevation myocardial infarction  I25.2     3. History of coronary angioplasty with insertion of stent  Z95.5     4. Paroxysmal atrial flutter (HCC)  I48.92 Ambulatory referral to Cardiac Electrophysiology    5. Long term (current) use of anticoagulants  Z79.01     6. Essential hypertension  I10     7. Mixed hyperlipidemia  E78.2        RECOMMENDATIONS: Jakera Beaupre is a 71 y.o. female whose past medical history and cardiovascular risk factors include: Hypertension, hyperlipidemia, history of anterior STEMI status post PCI to the mid/distal LAD with balloon angioplasty, history of atrial fibrillation, postmenopausal female, advanced age.  Coronary artery disease involving native coronary artery of native heart  without angina pectoris / Hx of ST elevation myocardial infarction / History of coronary angioplasty with insertion of stent. Denies angina pectoris. EKG  nonischemic. No use of sublingual nitroglycerin tablets since last office visit. Medications reconciled. Most recent echocardiogram results reviewed.  LVEF remains preserved. Currently on statin therapy. Recommended a goal LDL of less than 70 mg/dL.  Patient is resistant with regards up titration of statin therapy. We will obtain the most recent lipid profile from PCP.  Paroxysmal atrial flutter (HCC) Ventricular rate is controlled. Rate control: Metoprolol. Rhythm control: N/A. Thromboembolic prophylaxis: Eliquis [id number] SCORE is 4 which correlates to 4% risk of stroke per year (HTN, age, prior MI, gender).  Patient is currently in atrial flutter and therefore we discussed considering antiarrhythmic medications/direct-current cardioversion.  Patient states that she is not interested in proceeding with addition of more medications or cardiac rehab. Since the underlying rhythm is atrial flutter recommended considering consultation with cardiac electrophysiology for possible atrial flutter ablation.  Patient is in agreement.  We will place a consult for Dr. Lalla BrothersLambert.  Long term (current) use of anticoagulants Indication: Paroxysmal atrial fibrillation/flutter. Did not endorse evidence of bleeding. We will obtain labs from PCP to evaluate her hemoglobin and hematocrit.  Essential hypertension Office blood pressures are well controlled. Medications reconciled. No changes warranted at this time.  Mixed hyperlipidemia Currently on Crestor.   She denies myalgia or other side effects. We we will request most recent labs from PCP for further evaluation and management.  Orders Placed This Encounter  Procedures   Ambulatory referral to Cardiac Electrophysiology   EKG 12-Lead    --Continue cardiac medications as reconciled in final medication list. --Return in about 6 months (around 06/28/2022) for Follow up, CAD, Atrial flutter. Or sooner if needed. --Continue follow-up with your  primary care physician regarding the management of your other chronic comorbid conditions.  Patient's questions and concerns were addressed to her satisfaction. She voices understanding of the instructions provided during this encounter.   This note was created using a voice recognition software as a result there may be grammatical errors inadvertently enclosed that do not reflect the nature of this encounter. Every attempt is made to correct such errors.  Tessa LernerSunit Elic Vencill, [place]DO, [facility]ACC  Pager: [phone] Office: [phone]

## 2022-02-27 ENCOUNTER — Ambulatory Visit: Payer: TRICARE For Life (TFL) | Attending: Cardiology | Admitting: Cardiology

## 2022-02-27 ENCOUNTER — Encounter: Payer: Self-pay | Admitting: Cardiology

## 2022-02-27 VITALS — BP 154/84 | HR 58 | Ht 63.0 in | Wt 128.6 lb

## 2022-02-27 DIAGNOSIS — I4891 Unspecified atrial fibrillation: Secondary | ICD-10-CM

## 2022-02-27 DIAGNOSIS — I10 Essential (primary) hypertension: Secondary | ICD-10-CM

## 2022-02-27 NOTE — Patient Instructions (Signed)
Medication Instructions:  None *If you need a refill on your cardiac medications before your next appointment, please call your pharmacy*   Lab Work: None  If you have labs (blood work) drawn today and your tests are completely normal, you will receive your results only by: MyChart Message (if you have MyChart) OR A paper copy in the mail If you have any lab test that is abnormal or we need to change your treatment, we will call you to review the results.   Testing/Procedures: None    Follow-Up: At [facility], you and your health needs are our priority.  As part of our continuing mission to provide you with exceptional heart care, we have created designated Provider Care Teams.  These Care Teams include your primary Cardiologist (physician) and Advanced Practice Providers (APPs -  Physician Assistants and Nurse Practitioners) who all work together to provide you with the care you need, when you need it.  We recommend signing up for the patient portal called "MyChart".  Sign up information is provided on this After Visit Summary.  MyChart is used to connect with patients for Virtual Visits (Telemedicine).  Patients are able to view lab/test results, encounter notes, upcoming appointments, etc.  Non-urgent messages can be sent to your provider as well.   To learn more about what you can do with MyChart, go to [email or web address].    Your next appointment:   As needed with Dr. Lalla Brothers  Other Instructions None   Important Information About Sugar

## 2022-02-27 NOTE — Progress Notes (Signed)
Electrophysiology Office Note:    Date:  02/27/2022   ID:  Suzanne Hunt, DOB March 05, 1951, MRN [id number]  PCP:  [name], MD  [facility] Cardiologist:  None  [facility] Electrophysiologist:  Lanier Prude, MD   Referring MD: [name], DO   Chief Complaint: Atrial flutter  History of Present Illness:    Suzanne Hunt is a 71 y.o. female who presents for an evaluation for ablation consideration at the request of Dr. Odis Hollingshead. Their medical history includes CAD, anterior STEMI s/p balloon PTCA of the Distal LAD, paroxysmal atrial flutter, coarse atrial fibrillation, hypertension, and hyperlipidemia,.   Seen by Dr. Odis Hollingshead on 12/28/2021 where her EKG showed Atrial flutter at 62 bpm without underlying injury pattern. She was rate-controlled on metoprolol and compliant with Eliquis. She was not interested in adding more medications or cardiac rehab. She was referred to EP for consideration of atrial flutter ablation.  Today, she states that every now and then she feels a little flutter in her chest. She remains compliant with Eliquis.  Since her hospital admission for STEMI she denies any similar cardiac events.  She exercises her legs and arms on alternate weight resistance days. Occasionally she will walk up to 5 miles or 1 hour at a time.  In her family, her mother had a PPM implanted. Her brother recently underwent CABG.  She denies any chest pain, shortness of breath, or peripheral edema. No lightheadedness, headaches, syncope, orthopnea, or PND.     Past Medical History:  Diagnosis Date   CAD (coronary artery disease) 01/07/2019   Coronary artery disease    HTN (hypertension) 01/07/2019   Hypertension    Medical history non-contributory    Myocardial infarction [facility])    STEMI involving left anterior descending coronary artery (HCC) 12/08/2017   Balloon PTCA of the Distal LAD    Past Surgical History:  Procedure Laterality Date    CORONARY/GRAFT ACUTE MI REVASCULARIZATION N/A 12/08/2017   Procedure: Coronary/Graft Acute MI Revascularization;  Surgeon: Yates Decamp, MD;  Location: [facility] INVASIVE CV LAB;  Service: Cardiovascular;  Laterality: N/A;   LEFT HEART CATH AND CORONARY ANGIOGRAPHY N/A 12/08/2017   Procedure: LEFT HEART CATH AND CORONARY ANGIOGRAPHY;  Surgeon: Yates Decamp, MD;  Location: [facility] INVASIVE CV LAB;  Service: Cardiovascular;  Laterality: N/A;   LEFT HEART CATH AND CORONARY ANGIOGRAPHY N/A 11/08/2020   Procedure: LEFT HEART CATH AND CORONARY ANGIOGRAPHY;  Surgeon: Yates Decamp, MD;  Location: [facility] INVASIVE CV LAB;  Service: Cardiovascular;  Laterality: N/A;   TEE WITHOUT CARDIOVERSION N/A 03/25/2018   Procedure: TRANSESOPHAGEAL ECHOCARDIOGRAM (TEE);  Surgeon: Yates Decamp, MD;  Location: [facility] ENDOSCOPY;  Service: Cardiovascular;  Laterality: N/A;    Current Medications: Current Meds  Medication Sig   apixaban (ELIQUIS) 5 MG TABS tablet Take 1 tablet (5 mg total) by mouth 2 (two) times daily.   Calcium Carb-Cholecalciferol (CALCIUM 500 + D3 PO) Take 1 tablet by mouth daily.   ibandronate (BONIVA) 150 MG tablet Take 150 mg by mouth every 30 (thirty) days. Take in the morning with a full glass of water, on an empty stomach, and do not take anything else by mouth or lie down for the next 30 min. Takes the first week of the month.   losartan (COZAAR) 25 MG tablet TAKE 1 TABLET EVERY EVENING   Multiple Vitamin (MULTIVITAMIN WITH MINERALS) TABS tablet Take 1 tablet by mouth daily.   nitroGLYCERIN (NITROSTAT) 0.4 MG SL tablet Place 1 tablet (0.4 mg total) under  the tongue every 5 (five) minutes x 3 doses as needed for chest pain.   omeprazole (PRILOSEC) 20 MG capsule Take 20 mg by mouth daily as needed (heartburn/indigestion).   Polyvinyl Alcohol-Povidone PF 1.4-0.6 % SOLN Place 1 drop into both eyes daily as needed (dry/irritated eyes.).   rosuvastatin (CRESTOR) 20 MG tablet TAKE 1 TABLET DAILY AT 6 P.M. (NEED APPOINTMENT FOR  ADDITIONAL REFILLS)     Allergies:   Actinel [[id number]]   Social History   Socioeconomic History   Marital status: Married    Spouse name: [name]   Number of children: 1   Years of education: Not on file   Highest education level: Not on file  Occupational History   Not on file  Tobacco Use   Smoking status: Never   Smokeless tobacco: Never  Vaping Use   Vaping Use: Never used  Substance and Sexual Activity   Alcohol use: No   Drug use: No   Sexual activity: Yes    Birth control/protection: None  Other Topics Concern   Not on file  Social History Narrative   Not on file   Social Determinants of Health   Financial Resource Strain: Low Risk  (12/09/2017)   Overall Financial Resource Strain (CARDIA)    Difficulty of Paying Living Expenses: Not hard at all  Food Insecurity: No Food Insecurity (12/09/2017)   Hunger Vital Sign    Worried About Running Out of Food in the Last Year: Never true    Ran Out of Food in the Last Year: Never true  Transportation Needs: No Transportation Needs (12/09/2017)   PRAPARE - Administrator, Civil Service (Medical): No    Lack of Transportation (Non-Medical): No  Physical Activity: Sufficiently Active (12/09/2017)   Exercise Vital Sign    Days of Exercise per Week: 5 days    Minutes of Exercise per Session: 60 min  Stress: No Stress Concern Present (12/09/2017)   [employer] of Occupational Health - Occupational Stress Questionnaire    Feeling of Stress : Only a little  Social Connections: Socially Integrated (12/09/2017)   Social Connection and Isolation Panel [NHANES]    Frequency of Communication with Friends and Family: More than three times a week    Frequency of Social Gatherings with Friends and Family: More than three times a week    Attends Religious Services: More than 4 times per year    Active Member of [employer] or Organizations: Yes    Attends Banker Meetings: 1 to 4 times  per year    Marital Status: Married     Family History: The patient's family history includes Breast cancer (age of onset: 72) in her paternal aunt; Breast cancer (age of onset: 46) in her sister; Diabetes in her father and sister; Heart disease in her mother and sister.  ROS:   Please see the history of present illness.    (+) Palpitations All other systems reviewed and are negative.  EKGs/Labs/Other Studies Reviewed:    The following studies were reviewed today:  Echo  12/21/2021: Normal LV systolic function with visual EF 55-60%. Left ventricle cavity  is normal in size. Normal left ventricular wall thickness. Normal global  wall motion. Unable to evaluate diastolic function due to atrial flutter.  Normal LAP.  Trace aortic regurgitation.  Moderate (Grade II) mitral regurgitation.  Mild tricuspid regurgitation. No evidence of pulmonary hypertension.  Compared to 07/24/2019: MR remains stable, moderate LAE is now normal,  otherwise no  significant change.   EKG:   EKG is personally reviewed.  02/27/2022:  atrial flutter with slow ventricular response (58 bpm)   Recent Labs: No results found for requested labs within last 365 days.   Recent Lipid Panel    Component Value Date/Time   CHOL 136 08/10/2019 0825   TRIG 36 08/10/2019 0825   HDL 43 08/10/2019 0825   CHOLHDL 3.1 12/09/2017 0616   VLDL 7 12/09/2017 0616   LDLCALC 84 08/10/2019 0825    Physical Exam:    VS:  BP (!) 154/84   Pulse (!) 58   Ht 5\' 3"  (1.6 m)   Wt 128 lb 9.6 oz (58.3 kg)   SpO2 98%   BMI 22.78 kg/m     Wt Readings from Last 3 Encounters:  02/27/22 128 lb 9.6 oz (58.3 kg)  12/28/21 124 lb 12.8 oz (56.6 kg)  05/30/21 131 lb (59.4 kg)     GEN: Well nourished, well developed in no acute distress HEENT: Normal NECK: No JVD; No carotid bruits LYMPHATICS: No lymphadenopathy CARDIAC: Irregularly irregular, bradycardic, no murmurs, rubs, gallops RESPIRATORY:  Clear to auscultation without  rales, wheezing or rhonchi  ABDOMEN: Soft, non-tender, non-distended MUSCULOSKELETAL:  No edema; No deformity  SKIN: Warm and dry NEUROLOGIC:  Alert and oriented x 3 PSYCHIATRIC:  Normal affect       ASSESSMENT:    1. Atrial fibrillation, unspecified type (HCC)   2. Primary hypertension    PLAN:    In order of problems listed above:  #AFL No evidence of AF, only AFL visible to me on chart review. I have discussed options for rhythm control with the patient in depth including ablation and antiarrhythmic therapy. I have also discussed continuing with a conservative management strategy with stroke ppx alone.  After our discussion the patient would like to avoid invasive procedures and would like to avoid the potential for off target effects with antiarrhythmic therapy. I do think this is reasonable given the asymptomatic nature of her arrhythmia and rate control.   We also discussed her slow ventricular response today. We discussed the possibility of pacemaker in the future.  For now, continue eliquis for stroke ppx.  #Hypertension Above goal today.  Recommend checking blood pressures 1-2 times per week at home and recording the values.  Recommend bringing these recordings to the primary care physician.   Follow-up with EP as needed.     Medication Adjustments/Labs and Tests Ordered: Current medicines are reviewed at length with the patient today.  Concerns regarding medicines are outlined above.   No orders of the defined types were placed in this encounter.  No orders of the defined types were placed in this encounter.  I,Mathew Stumpf,acting as a 06/01/21 for Neurosurgeon, MD.,have documented all relevant documentation on the behalf of Lanier Prude, MD,as directed by  Lanier Prude, MD while in the presence of Lanier Prude, MD.  I, Lanier Prude, MD, have reviewed all documentation for this visit. The documentation on 02/27/22 for the exam,  diagnosis, procedures, and orders are all accurate and complete.   Signed, 03/01/22. Rossie Muskrat, MD, [facility], [facility] 02/27/2022 7:00 PM    Electrophysiology [facility]

## 2022-03-08 ENCOUNTER — Other Ambulatory Visit: Payer: Self-pay | Admitting: Cardiology

## 2022-03-08 DIAGNOSIS — I484 Atypical atrial flutter: Secondary | ICD-10-CM

## 2022-03-13 ENCOUNTER — Other Ambulatory Visit: Payer: Self-pay

## 2022-03-13 DIAGNOSIS — I484 Atypical atrial flutter: Secondary | ICD-10-CM

## 2022-03-13 MED ORDER — APIXABAN 5 MG PO TABS: 5.0000 mg | ORAL_TABLET | Freq: Two times a day (BID) | ORAL | 3 refills | Status: DC

## 2022-03-13 MED ORDER — METOPROLOL SUCCINATE ER 50 MG PO TB24: 50.0000 mg | ORAL_TABLET | Freq: Every day | ORAL | 3 refills | Status: DC

## 2022-03-13 MED ORDER — LOSARTAN POTASSIUM 25 MG PO TABS: 25.0000 mg | ORAL_TABLET | Freq: Every evening | ORAL | 3 refills | Status: DC

## 2022-03-13 MED ORDER — ROSUVASTATIN CALCIUM 20 MG PO TABS: 20.0000 mg | ORAL_TABLET | Freq: Every day | ORAL | 3 refills | Status: DC

## 2022-03-28 ENCOUNTER — Other Ambulatory Visit: Payer: Self-pay | Admitting: Cardiology

## 2022-04-17 ENCOUNTER — Other Ambulatory Visit (HOSPITAL_BASED_OUTPATIENT_CLINIC_OR_DEPARTMENT_OTHER): Payer: Self-pay

## 2022-04-17 MED ORDER — COMIRNATY 30 MCG/0.3ML IM SUSY
PREFILLED_SYRINGE | INTRAMUSCULAR | 0 refills | Status: DC
  Filled 2022-04-17: qty 0.3, 1d supply, fill #0

## 2022-04-20 ENCOUNTER — Other Ambulatory Visit (HOSPITAL_BASED_OUTPATIENT_CLINIC_OR_DEPARTMENT_OTHER): Payer: Self-pay

## 2022-05-03 ENCOUNTER — Other Ambulatory Visit: Payer: Self-pay

## 2022-05-03 MED ORDER — ROSUVASTATIN CALCIUM 20 MG PO TABS: 20.0000 mg | ORAL_TABLET | Freq: Every day | ORAL | 3 refills | Status: DC

## 2022-05-07 ENCOUNTER — Other Ambulatory Visit: Payer: Self-pay

## 2022-05-07 MED ORDER — ROSUVASTATIN CALCIUM 20 MG PO TABS: 20.0000 mg | ORAL_TABLET | Freq: Every day | ORAL | 0 refills | Status: DC

## 2022-06-05 DIAGNOSIS — H35363 Drusen (degenerative) of macula, bilateral: Secondary | ICD-10-CM | POA: Diagnosis not present

## 2022-06-05 DIAGNOSIS — H40013 Open angle with borderline findings, low risk, bilateral: Secondary | ICD-10-CM | POA: Diagnosis not present

## 2022-06-05 DIAGNOSIS — H04123 Dry eye syndrome of bilateral lacrimal glands: Secondary | ICD-10-CM | POA: Diagnosis not present

## 2022-06-05 DIAGNOSIS — H25813 Combined forms of age-related cataract, bilateral: Secondary | ICD-10-CM | POA: Diagnosis not present

## 2022-06-12 ENCOUNTER — Other Ambulatory Visit: Payer: TRICARE For Life (TFL)

## 2022-06-28 ENCOUNTER — Ambulatory Visit: Payer: Medicare PPO | Admitting: Cardiology

## 2022-06-28 ENCOUNTER — Other Ambulatory Visit: Payer: Self-pay

## 2022-06-28 ENCOUNTER — Telehealth: Payer: Self-pay | Admitting: Cardiology

## 2022-06-28 ENCOUNTER — Encounter: Payer: Self-pay | Admitting: Cardiology

## 2022-06-28 VITALS — BP 142/77 | HR 58 | Resp 16 | Ht 63.0 in | Wt 130.0 lb

## 2022-06-28 DIAGNOSIS — I2510 Atherosclerotic heart disease of native coronary artery without angina pectoris: Secondary | ICD-10-CM | POA: Diagnosis not present

## 2022-06-28 DIAGNOSIS — Z7901 Long term (current) use of anticoagulants: Secondary | ICD-10-CM

## 2022-06-28 DIAGNOSIS — I10 Essential (primary) hypertension: Secondary | ICD-10-CM

## 2022-06-28 DIAGNOSIS — Z955 Presence of coronary angioplasty implant and graft: Secondary | ICD-10-CM | POA: Diagnosis not present

## 2022-06-28 DIAGNOSIS — I252 Old myocardial infarction: Secondary | ICD-10-CM

## 2022-06-28 DIAGNOSIS — I4892 Unspecified atrial flutter: Secondary | ICD-10-CM

## 2022-06-28 DIAGNOSIS — E782 Mixed hyperlipidemia: Secondary | ICD-10-CM

## 2022-06-28 DIAGNOSIS — I25118 Atherosclerotic heart disease of native coronary artery with other forms of angina pectoris: Secondary | ICD-10-CM

## 2022-06-28 MED ORDER — NITROGLYCERIN 0.4 MG SL SUBL: 0.4000 mg | SUBLINGUAL_TABLET | SUBLINGUAL | 0 refills | Status: DC | PRN

## 2022-06-28 MED ORDER — APIXABAN 5 MG PO TABS: 5.0000 mg | ORAL_TABLET | Freq: Two times a day (BID) | ORAL | 3 refills | Status: DC

## 2022-06-28 NOTE — Progress Notes (Signed)
Suzanne Hunt Date of Birth: 12/27/1950 MRN: [id number] Primary Care Provider:Sun, Gari Crown, MD  Former Cardiologist: Dr. Adrian Prows and Jeri Lager, APRN, FNP-C Primary Cardiologist: Rex Kras, DO (established care 07/16/2019)  Date: 06/28/22 Last Office Visit: 12/28/2021  Chief Complaint  Patient presents with   Coronary Artery Disease   Atrial Flutter   Follow-up    6 months   HPI  Suzanne Hunt is a 72 y.o.  female whose past medical history and cardiovascular risk factors include: Hypertension, hyperlipidemia, history of anterior STEMI status post PCI to the mid/distal LAD with balloon angioplasty, history of atrial fibrillation, paroxysmal atrial flutter, postmenopausal female, advanced age.  Has history of anterior STEMI back in August 2019 and underwent angioplasty/PCI to the mid/distal LAD.  She also has history of paroxysmal atrial flutter/coarse atrial fibrillation.  She now presents for 15-monthfollow-up visit.  She denies anginal chest pain or heart related symptoms.  Overall functional capacity remains relatively stable.  She goes to the gym regularly and denies any exertional symptoms.  At the last office visit had a long discussion with her regarding the management of atrial flutter.  Shared decision was to follow-up with electrophysiology and discussed catheter directed ablation versus antiarrhythmic medications.  Patient did follow-up with Dr. LQuentin Oreand prefers to continue with conservative management for now with rate control strategy.  She does not endorse evidence of bleeding.  She is requesting refill on nitroglycerin tablets as well as Eliquis.  ALLERGIES: Allergies  Allergen Reactions   Actinel [Pseudoephedrine-Dm-Gg] Other (See Comments)    Flu like symptoms   MEDICATION LIST PRIOR TO VISIT: Current Outpatient Medications on File Prior to Visit  Medication Sig Dispense Refill   Calcium Carb-Cholecalciferol (CALCIUM 500 + D3 PO)  Take 1 tablet by mouth daily.     COVID-19 mRNA vaccine 2023-2024 (COMIRNATY) syringe Inject into the muscle. 0.3 mL 0   ibandronate (BONIVA) 150 MG tablet Take 150 mg by mouth every 30 (thirty) days. Take in the morning with a full glass of water, on an empty stomach, and do not take anything else by mouth or lie down for the next 30 min. Takes the first week of the month.     losartan (COZAAR) 25 MG tablet Take 1 tablet (25 mg total) by mouth every evening. 90 tablet 3   metoprolol succinate (TOPROL-XL) 50 MG 24 hr tablet TAKE 1 TABLET DAILY. TAKE WITH OR IMMEDIATELY FOLLOWING A MEAL 90 tablet 3   Multiple Vitamin (MULTIVITAMIN WITH MINERALS) TABS tablet Take 1 tablet by mouth daily.     omeprazole (PRILOSEC) 20 MG capsule Take 20 mg by mouth daily as needed (heartburn/indigestion).     Polyvinyl Alcohol-Povidone PF 1.4-0.6 % SOLN Place 1 drop into both eyes daily as needed (dry/irritated eyes.).     rosuvastatin (CRESTOR) 20 MG tablet Take 1 tablet (20 mg total) by mouth daily. 10 tablet 0   No current facility-administered medications on file prior to visit.    PAST MEDICAL HISTORY: Past Medical History:  Diagnosis Date   CAD (coronary artery disease) 01/07/2019   Coronary artery disease    HTN (hypertension) 01/07/2019   Hypertension    Medical history non-contributory    Myocardial infarction ([facility]    STEMI involving left anterior descending coronary artery ([place] 12/08/2017   Balloon PTCA of the Distal LAD    PAST SURGICAL HISTORY: Past Surgical History:  Procedure Laterality Date   CORONARY/GRAFT ACUTE MI REVASCULARIZATION N/A 12/08/2017   Procedure: Coronary/Graft  Acute MI Revascularization;  Surgeon: Adrian Prows, MD;  Location: [facility];  Service: Cardiovascular;  Laterality: N/A;   LEFT HEART CATH AND CORONARY ANGIOGRAPHY N/A 12/08/2017   Procedure: LEFT HEART CATH AND CORONARY ANGIOGRAPHY;  Surgeon: Adrian Prows, MD;  Location: [facility];  Service:  Cardiovascular;  Laterality: N/A;   LEFT HEART CATH AND CORONARY ANGIOGRAPHY N/A 11/08/2020   Procedure: LEFT HEART CATH AND CORONARY ANGIOGRAPHY;  Surgeon: Adrian Prows, MD;  Location: [facility];  Service: Cardiovascular;  Laterality: N/A;   TEE WITHOUT CARDIOVERSION N/A 03/25/2018   Procedure: TRANSESOPHAGEAL ECHOCARDIOGRAM (TEE);  Surgeon: Adrian Prows, MD;  Location: [facility];  Service: Cardiovascular;  Laterality: N/A;    FAMILY HISTORY: The patient's family history includes Breast cancer (age of onset: 39) in her paternal aunt; Breast cancer (age of onset: 42) in her sister; Diabetes in her father and sister; Heart disease in her mother and sister.   SOCIAL HISTORY:  The patient  reports that she has never smoked. She has never used smokeless tobacco. She reports that she does not drink alcohol and does not use drugs.  Review of Systems  Constitutional: Negative for chills and fever.  HENT:  Negative for ear discharge, ear pain and nosebleeds.   Eyes:  Negative for blurred vision and discharge.  Cardiovascular:  Negative for chest pain, claudication, dyspnea on exertion, leg swelling, near-syncope, orthopnea, palpitations, paroxysmal nocturnal dyspnea and syncope.  Respiratory:  Negative for cough and shortness of breath.   Endocrine: Negative for polydipsia, polyphagia and polyuria.  Hematologic/Lymphatic: Negative for bleeding problem.  Skin:  Negative for flushing and nail changes.  Musculoskeletal:  Negative for muscle cramps and myalgias.  Gastrointestinal:  Negative for abdominal pain, dysphagia, hematemesis, hematochezia, melena, nausea and vomiting.  Neurological:  Negative for dizziness, focal weakness and light-headedness.    PHYSICAL EXAM:    06/28/2022    3:39 PM 06/28/2022    3:38 PM 06/28/2022    3:31 PM  Vitals with BMI  Height   '5\' 3"'$   Weight   130 lbs  BMI   0000000  Systolic [id number]  Diastolic 77 76 67  Pulse   58   Physical Exam   Constitutional: No distress.  Age appropriate, hemodynamically stable.   Neck: No JVD present.  Cardiovascular: S1 normal, S2 normal, intact distal pulses and normal pulses. An irregularly irregular rhythm present. Bradycardia present. Exam reveals no gallop, no S3 and no S4.  No murmur heard. Pulmonary/Chest: Effort normal and breath sounds normal. No stridor. She has no wheezes. She has no rales.  Abdominal: Soft. Bowel sounds are normal. She exhibits no distension. There is no abdominal tenderness.  Musculoskeletal:        General: No edema.     Cervical back: Neck supple.  Neurological: She is alert and oriented to person, place, and time. She has intact cranial nerves (2-12).  Skin: Skin is warm and moist.   CARDIAC DATABASE: EKG: 12/18/2017: Coarse atrial fibrillation with controlled ventricular response at the rate of 77 bpm, normal axis, anterolateral infarct old with persistent deep T wave inversion in V1 to V6. Abnormal EKG. 06/02/2020: Atrial flutter, 60 bpm, normal axis, nonspecific T wave abnormality.  Compared to prior EKG dated  05/30/2021: Sinus bradycardia, old anteroseptal infarct, without underlying injury pattern.  12/28/2021: Atrial flutter, 62 bpm, without underlying injury pattern. 06/28/2022: Atrial flutter with variable conduction, 55 bpm.  Echocardiogram: 12/21/2021:  Normal LV systolic function with visual  EF 55-60%. Left ventricle cavity is normal in size. Normal left ventricular wall thickness. Normal global wall motion. Unable to evaluate diastolic function due to atrial flutter. Normal LAP.  Trace aortic regurgitation.  Moderate (Grade II) mitral regurgitation.  Mild tricuspid regurgitation. No evidence of pulmonary hypertension.  Compared to 07/24/2019: MR remains stable, moderate LAE is now normal, otherwise no significant change.   Stress Testing:  Lexiscan Tetrofosmin Stress Test  08/03/2020: Equivocal ECG stress.  Resting EKG demonstrated normal sinus  rhythm. Non-specific T-wave abnormalities. Peak EKG revealed less than 1 mm ST depression present with Lexiscan infusion. Myocardial perfusion is abnormal. There is a reversible mild defect in the apical region. Overall LV systolic function is normal without regional wall motion abnormalities. Stress LV EF: 62%. No previous exam available for comparison. Intermediate risk scan.  Heart Catheterization: Left Heart Catheterization 11/08/20:  LV: LV pressure: 107/0, EDP 5 mmHg.  Aortic pressure 100/49, mean 69 mmHg.  There is no pressure gradient across the aortic valve.  Low normal LV systolic function [id number] with mid to distal anterolateral hypokinesis. No mitral regurgitation.   Left main: Long vessel, mild calcification is noted. LAD: Large vessel, mild diffuse coronary calcification is evident.  There is minimal disease in the midsegment at most 20 to 30%.  Previously performed balloon angioplasty site on 12/08/2017 the mid LAD and apical LAD is widely patent. CX: Moderate caliber vessel, smooth and normal.  Mild coronary calcification is evident. RCA: Mid segment has a 30% stenosis.  Otherwise vessel is smooth.  There is mild calcification noted in the proximal RCA.   Recommendation: Patient has very mild disease and previously performed angioplasty site at the mid LAD and distal LAD on 12/08/2017 is widely patent.  Continue anticoagulation for paroxysmal atrial fibrillation, patient was in coarse atrial fibrillation today during heart catheterization.  30 mill contrast utilized.   LABORATORY DATA:    Latest Ref Rng & Units 11/03/2020    8:09 AM 10/28/2020   10:53 AM 08/10/2019    8:25 AM  CBC  WBC 3.4 - 10.8 x10E3/uL 5.5  6.2  3.3   Hemoglobin 11.1 - 15.9 g/dL 12.3  12.7  12.7   Hematocrit 34.0 - 46.6 % 36.9  39.6  38.7   Platelets 150 - 450 x10E3/uL 209  177  151        Latest Ref Rng & Units 11/03/2020    8:09 AM 10/28/2020   10:53 AM 08/31/2019    8:15 AM  CMP  Glucose 65 - 99 mg/dL 88   117  81   BUN 8 - 27 mg/dL '25  21  24   '$ Creatinine 0.57 - 1.00 mg/dL 1.08  1.10  1.14   Sodium 134 - 144 mmol/L 142  136  141   Potassium 3.5 - 5.2 mmol/L 4.4  4.1  4.5   Chloride 96 - 106 mmol/L 105  104  107   CO2 20 - 29 mmol/L '24  23  21   '$ Calcium 8.7 - 10.3 mg/dL 9.1  9.2  9.0   Total Protein 6.5 - 8.1 g/dL  6.6  5.9   Total Bilirubin 0.3 - 1.2 mg/dL  1.4  0.7   Alkaline Phos 38 - 126 U/L  95  145   AST 15 - 41 U/L  28  49   ALT 0 - 44 U/L  31  66     Lipid Panel  Lab Results  Component Value Date   CHOL 136 08/10/2019  HDL 43 08/10/2019   LDLCALC 84 08/10/2019   TRIG 36 08/10/2019   CHOLHDL 3.1 12/09/2017     Lab Results  Component Value Date   HGBA1C 4.9 12/08/2017   No components found for: "NTPROBNP" Lab Results  Component Value Date   TSH 3.796 12/09/2017   External Labs: Collected: 11/17/2020 available in Care Everywhere performed at [facility]. Hemoglobin 13.1 g/dL, hematocrit 39.6%. BUN 20, creatinine 1.14. Sodium 139, potassium 4.3, chloride 106, bicarb 27, AST 27, ALT 31, alkaline phosphatase 103 Total cholesterol 147, triglycerides 46, LDL 87, non-HDL 98  External labs 12/13/2021 Lipid Panel w/reflex Reviewed date:12/13/2021 02:12:57 PM Interpretation:LDL 86 Performing Lab: Notes/Report: Testing Performed at: [facility], [address], [place], [place] [address]  Cholesterol 139 <200 mg/dL    CHOL/HDL 3.2 2.0-4.0 Ratio    HDLD 44 30-85 mg/dL Values below 40 mg/dL indicate increased risk factor  Triglyceride 40 0-199 mg/dL    NHDL 95 0-129 mg/dL Range dependent upon risk factors.  LDL Chol Calc (NIH) 86 0-99 mg/dL    Comp Metabolic Panel Reviewed [id number] 02:12:49 PM Interpretation:eGFR 53 Performing Lab: Notes/Report: Testing Performed at: [facility], [address], [address], [place], [place] [address]  Glucose 81 70-99 mg/dL    BUN 22 6-26 mg/dL    Creatinine 1.12 0.60-1.30 mg/dl    [id number] 53 >60 calc In  accordance with recommendations from NKF-ASN Task Force, [facility] has updated its eGFR calc to the 2021 CKD-EDI equation that estimates kidney function without a race variable;Stage 1 > 90 ML/Min plus Albuminuria;Stage 2 60-89 ML/MIN;Stage 3 30-59 ML/MIN;Stage 4 15-29 ML/MIN;Stage 5 <15 ML/MIN  Sodium 141 136-145 mmol/L    Potassium 4.6 3.5-5.5 mmol/L    Chloride 108 98-107 mmol/L    CO2 30 22-32 mmol/L    Anion Gap 8.0 6.0-20.0 mmol/L    Calcium 9.1 8.6-10.3 mg/dL    CA-corrected 9.41 8.60-10.30 mg/dL    Protein, Total 6.3 6.0-8.3 g/dL    Albumin 3.7 3.4-4.8 g/dL    TBIL 0.8 0.3-1.0 mg/dL    ALP 86 38-126 U/L    AST 21 0-39 U/L    ALT 13 0-52 U/L    CBC without Diff Reviewed date:12/13/2021 02:12:35 PM Interpretation:Hgb 11.7 Performing Lab: Notes/Report: Testing Performed at: [facility], [address], [address], [place],  [address]  WBC 3.6 4.0-11.0 K/ul    RBC 3.73 4.20-5.40 M/uL    HGB 11.7 12.0-16.0 g/dL    HCT 35.6 37.0-47.0 %    MCV 95.3 81.0-99.0 fL    MCH 31.3 27.0-33.0 pg    MCHC 32.8 32.0-36.0 g/dL    RDW 14.0 11.5-15.5 %    PLT 166 150-400   .  FINAL MEDICATION LIST END OF ENCOUNTER: Meds ordered this encounter  Medications   DISCONTD: nitroGLYCERIN (NITROSTAT) 0.4 MG SL tablet    Sig: Place 1 tablet (0.4 mg total) under the tongue every 5 (five) minutes as needed for chest pain. If you require more than two tablets five minutes apart go to the nearest ER via EMS.    Dispense:  30 tablet    Refill:  0   apixaban (ELIQUIS) 5 MG TABS tablet    Sig: Take 1 tablet (5 mg total) by mouth 2 (two) times daily.    Dispense:  180 tablet    Refill:  3      Current Outpatient Medications:    Calcium Carb-Cholecalciferol (CALCIUM 500 + D3 PO), Take 1 tablet by mouth daily., Disp: ,  Rfl:    COVID-19 mRNA vaccine 2023-2024 (COMIRNATY) syringe, Inject into the muscle., Disp: 0.3 mL, Rfl: 0   ibandronate (BONIVA) 150 MG tablet, Take 150 mg by mouth every 30 (thirty)  days. Take in the morning with a full glass of water, on an empty stomach, and do not take anything else by mouth or lie down for the next 30 min. Takes the first week of the month., Disp: , Rfl:    losartan (COZAAR) 25 MG tablet, Take 1 tablet (25 mg total) by mouth every evening., Disp: 90 tablet, Rfl: 3   metoprolol succinate (TOPROL-XL) 50 MG 24 hr tablet, TAKE 1 TABLET DAILY. TAKE WITH OR IMMEDIATELY FOLLOWING A MEAL, Disp: 90 tablet, Rfl: 3   Multiple Vitamin (MULTIVITAMIN WITH MINERALS) TABS tablet, Take 1 tablet by mouth daily., Disp: , Rfl:    nitroGLYCERIN (NITROSTAT) 0.4 MG SL tablet, Place 1 tablet (0.4 mg total) under the tongue every 5 (five) minutes as needed for chest pain. If you require more than two tablets five minutes apart go to the nearest ER via EMS., Disp: 30 tablet, Rfl: 0   omeprazole (PRILOSEC) 20 MG capsule, Take 20 mg by mouth daily as needed (heartburn/indigestion)., Disp: , Rfl:    Polyvinyl Alcohol-Povidone PF 1.4-0.6 % SOLN, Place 1 drop into both eyes daily as needed (dry/irritated eyes.)., Disp: , Rfl:    rosuvastatin (CRESTOR) 20 MG tablet, Take 1 tablet (20 mg total) by mouth daily., Disp: 10 tablet, Rfl: 0   apixaban (ELIQUIS) 5 MG TABS tablet, Take 1 tablet (5 mg total) by mouth 2 (two) times daily., Disp: 180 tablet, Rfl: 3  IMPRESSION:    ICD-10-CM   1. Coronary artery disease involving native coronary artery of native heart without angina pectoris  I25.10 EKG 12-Lead    Lipid Panel With LDL/HDL Ratio    LDL cholesterol, direct    CMP14+EGFR    DISCONTINUED: nitroGLYCERIN (NITROSTAT) 0.4 MG SL tablet    2. Hx of ST elevation myocardial infarction  I25.2 DISCONTINUED: nitroGLYCERIN (NITROSTAT) 0.4 MG SL tablet    3. History of coronary angioplasty with insertion of stent  Z95.5 DISCONTINUED: nitroGLYCERIN (NITROSTAT) 0.4 MG SL tablet    4. Atrial flutter, chronic (HCC)  I48.92 EKG 12-Lead    Hemoglobin and hematocrit, blood    apixaban (ELIQUIS) 5 MG  TABS tablet    5. Long term (current) use of anticoagulants  Z79.01 Hemoglobin and hematocrit, blood    6. Essential hypertension  I10     7. Mixed hyperlipidemia  E78.2     8. Coronary artery disease of native artery of native heart with stable angina pectoris [facility])  I25.118        RECOMMENDATIONS: Shadon Barcena is a 72 y.o. female whose past medical history and cardiovascular risk factors include: Hypertension, hyperlipidemia, history of anterior STEMI status post PCI to the mid/distal LAD with balloon angioplasty, history of atrial fibrillation, postmenopausal female, advanced age.  Coronary artery disease involving native coronary artery of native heart without angina pectoris / Hx of ST elevation myocardial infarction / History of coronary angioplasty with insertion of stent. Denies anginal discomfort. No use of sublingual nitroglycerin tablets. EKG atrial flutter without obvious evidence of ischemia. Last echo and stress test results reviewed. Recommended goal LDL less than 55 mg/dL given her history of STEMI and PCI to the LAD. Nitroglycerin tablets refilled. Fasting lipid profile.  Persistent atrial flutter (HCC) Ventricular rate is controlled. Rate control: Metoprolol. Rhythm control: N/A. Thromboembolic  prophylaxis: Eliquis CHA2DS2-VASc SCORE is 4 which correlates to 4% risk of stroke per year (HTN, age, prior MI, gender).  Saw Dr. Quentin Ore in November 2023 and she prefers current management for now. However, she may reach out to Dr. Quentin Ore to discuss this further.  Long term (current) use of anticoagulants Indication: Persistent atrial flutter. Did not endorse evidence of bleeding. Check hemoglobin and hematocrit  Essential hypertension Office blood pressures are now well-controlled. Discussed uptitration of medical therapy but she would like to hold off on additional medications. Discussed with PCP.  Mixed hyperlipidemia Currently on Crestor.   She  denies myalgia or other side effects. Recommend a goal LDL <55 mg/dL given her history of STEMI and CAD.  Will check fasting lipid profile as discussed above.  Orders Placed This Encounter  Procedures   Lipid Panel With LDL/HDL Ratio   LDL cholesterol, direct   CMP14+EGFR   Hemoglobin and hematocrit, blood   EKG 12-Lead    --Continue cardiac medications as reconciled in final medication list. --Return in about 6 months (around 12/29/2022) for Follow up, Atrial flutter, CAD. Or sooner if needed. --Continue follow-up with your primary care physician regarding the management of your other chronic comorbid conditions.  Patient's questions and concerns were addressed to her satisfaction. She voices understanding of the instructions provided during this encounter.   This note was created using a voice recognition software as a result there may be grammatical errors inadvertently enclosed that do not reflect the nature of this encounter. Every attempt is made to correct such errors.  Rex Kras, [place], [facility]  Pager:  [phone] Office: [phone]

## 2022-06-28 NOTE — Telephone Encounter (Signed)
Left a message to call back.

## 2022-06-28 NOTE — Telephone Encounter (Signed)
Pt called in asking to speak to Dr. Quentin Ore about discussing some procedure options (pacemaker).

## 2022-06-29 ENCOUNTER — Other Ambulatory Visit: Payer: Self-pay

## 2022-06-29 DIAGNOSIS — Z955 Presence of coronary angioplasty implant and graft: Secondary | ICD-10-CM

## 2022-06-29 DIAGNOSIS — I2510 Atherosclerotic heart disease of native coronary artery without angina pectoris: Secondary | ICD-10-CM

## 2022-06-29 DIAGNOSIS — I252 Old myocardial infarction: Secondary | ICD-10-CM

## 2022-06-29 DIAGNOSIS — I4892 Unspecified atrial flutter: Secondary | ICD-10-CM

## 2022-06-29 DIAGNOSIS — Z7901 Long term (current) use of anticoagulants: Secondary | ICD-10-CM

## 2022-06-29 MED ORDER — NITROGLYCERIN 0.4 MG SL SUBL: 0.4000 mg | SUBLINGUAL_TABLET | SUBLINGUAL | 3 refills | Status: DC | PRN

## 2022-06-29 NOTE — Telephone Encounter (Signed)
Returned call to patient and had lengthy discussion about ablation and potential pacemaker mentioned by Quentin Ore at 11/23 appt. Pt verified multiple times that she is not having, and has not had, any symptoms since being seen, but is now wanting to discuss the ablation and pacemaker that was mentioned again. I attempted to answer all questions, she states however, that she wants another appt with Quentin Ore and would like her husband to be with her at that appt.   Will route to scheduler to see when his schedule would accommodate. She would like a Tuesday or Thursday afternoon.

## 2022-07-12 DIAGNOSIS — I2510 Atherosclerotic heart disease of native coronary artery without angina pectoris: Secondary | ICD-10-CM | POA: Diagnosis not present

## 2022-07-12 DIAGNOSIS — I4892 Unspecified atrial flutter: Secondary | ICD-10-CM | POA: Diagnosis not present

## 2022-07-12 DIAGNOSIS — Z7901 Long term (current) use of anticoagulants: Secondary | ICD-10-CM | POA: Diagnosis not present

## 2022-07-13 LAB — CMP14+EGFR
ALT: 37 IU/L — ABNORMAL HIGH (ref 0–32)
AST: 25 IU/L (ref 0–40)
Albumin/Globulin Ratio: 1.5 (ref 1.2–2.2)
Albumin: 3.6 g/dL — ABNORMAL LOW (ref 3.9–4.9)
Alkaline Phosphatase: 152 IU/L — ABNORMAL HIGH (ref 44–121)
BUN/Creatinine Ratio: 23 (ref 12–28)
BUN: 23 mg/dL (ref 8–27)
Bilirubin Total: 0.5 mg/dL (ref 0.0–1.2)
CO2: 23 mmol/L (ref 20–29)
Calcium: 8.9 mg/dL (ref 8.7–10.3)
Chloride: 106 mmol/L (ref 96–106)
Creatinine, Ser: 1 mg/dL (ref 0.57–1.00)
Globulin, Total: 2.4 g/dL (ref 1.5–4.5)
Glucose: 88 mg/dL (ref 70–99)
Potassium: 4.4 mmol/L (ref 3.5–5.2)
Sodium: 140 mmol/L (ref 134–144)
Total Protein: 6 g/dL (ref 6.0–8.5)
eGFR: 61 mL/min/{1.73_m2} (ref >59)

## 2022-07-13 LAB — HEMOGLOBIN AND HEMATOCRIT, BLOOD
Hematocrit: 36.9 % (ref 34.0–46.6)
Hemoglobin: 11.6 g/dL (ref 11.1–15.9)

## 2022-07-13 LAB — LIPID PANEL WITH LDL/HDL RATIO
Cholesterol, Total: 129 mg/dL (ref 100–199)
HDL: 47 mg/dL (ref >39)
LDL Chol Calc (NIH): 73 mg/dL (ref 0–99)
LDL/HDL Ratio: 1.6 ratio (ref 0.0–3.2)
Triglycerides: 35 mg/dL (ref 0–149)
VLDL Cholesterol Cal: 9 mg/dL (ref 5–40)

## 2022-07-13 LAB — LDL CHOLESTEROL, DIRECT: LDL Direct: 81 mg/dL (ref 0–99)

## 2022-07-20 IMAGING — MG MM DIGITAL SCREENING BILAT W/ TOMO AND CAD
6 of 10 series · 6 of 30 positions shown · non-contrast
Comparison: Previous exam(s).

CLINICAL DATA: Screening.

EXAM:
DIGITAL SCREENING BILATERAL MAMMOGRAM WITH TOMOSYNTHESIS AND CAD
TECHNIQUE: Bilateral screening digital craniocaudal and mediolateral oblique
mammograms were obtained. Bilateral screening digital breast
tomosynthesis was performed. The images were evaluated with
computer-aided detection.

[R MLO synth-2D]
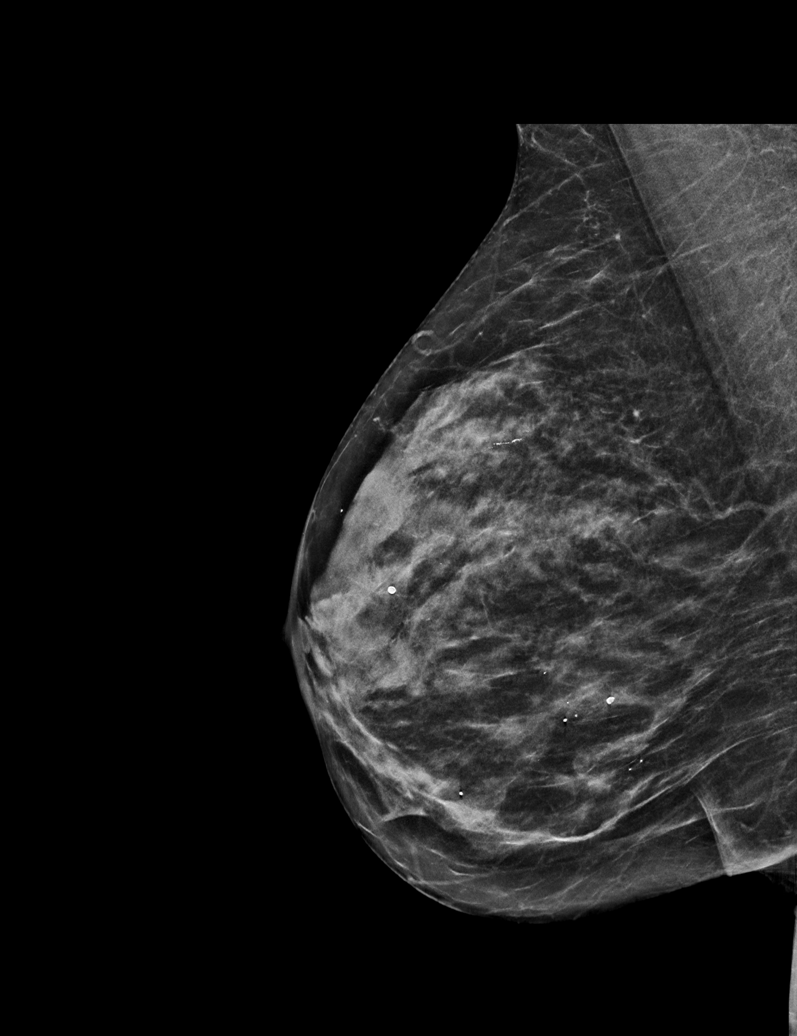

[R CC synth-2D (1 of 2)]
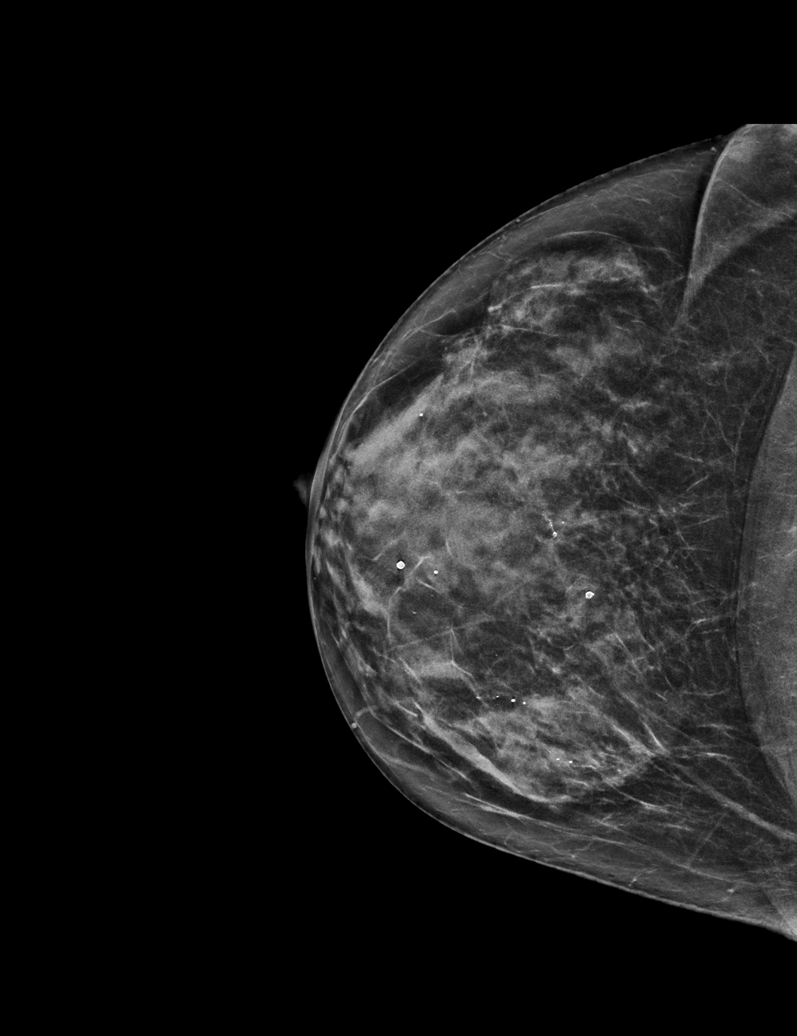

[R CC synth-2D (2 of 2)]
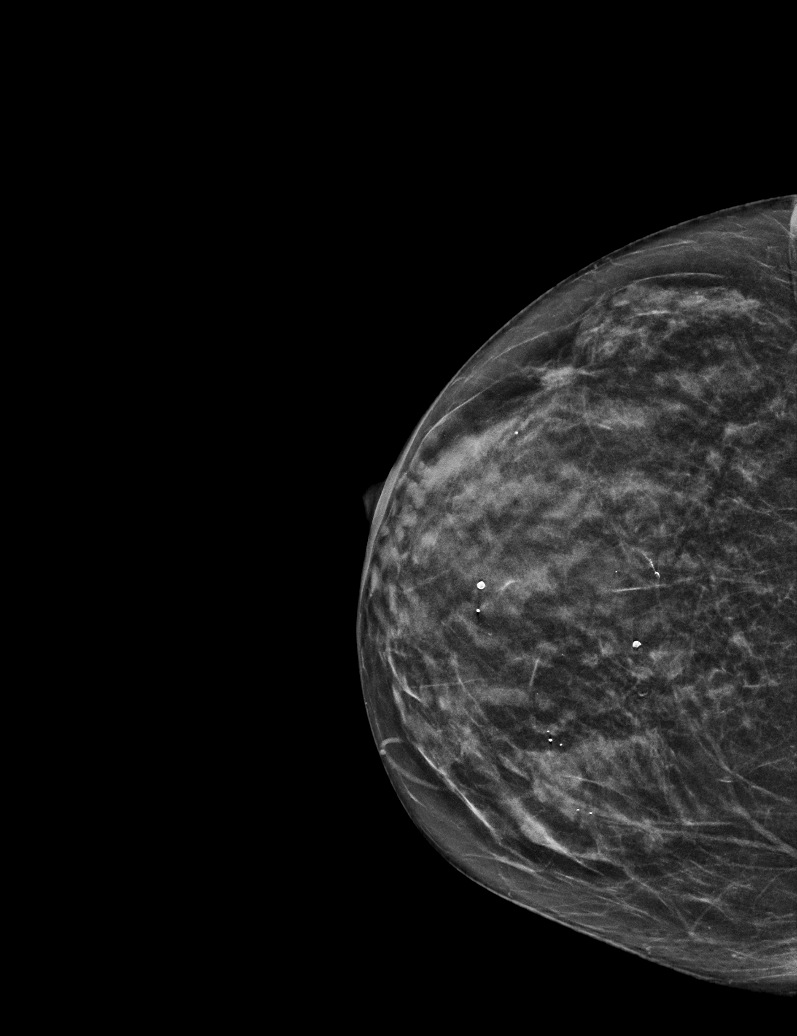

[L MLO synth-2D]
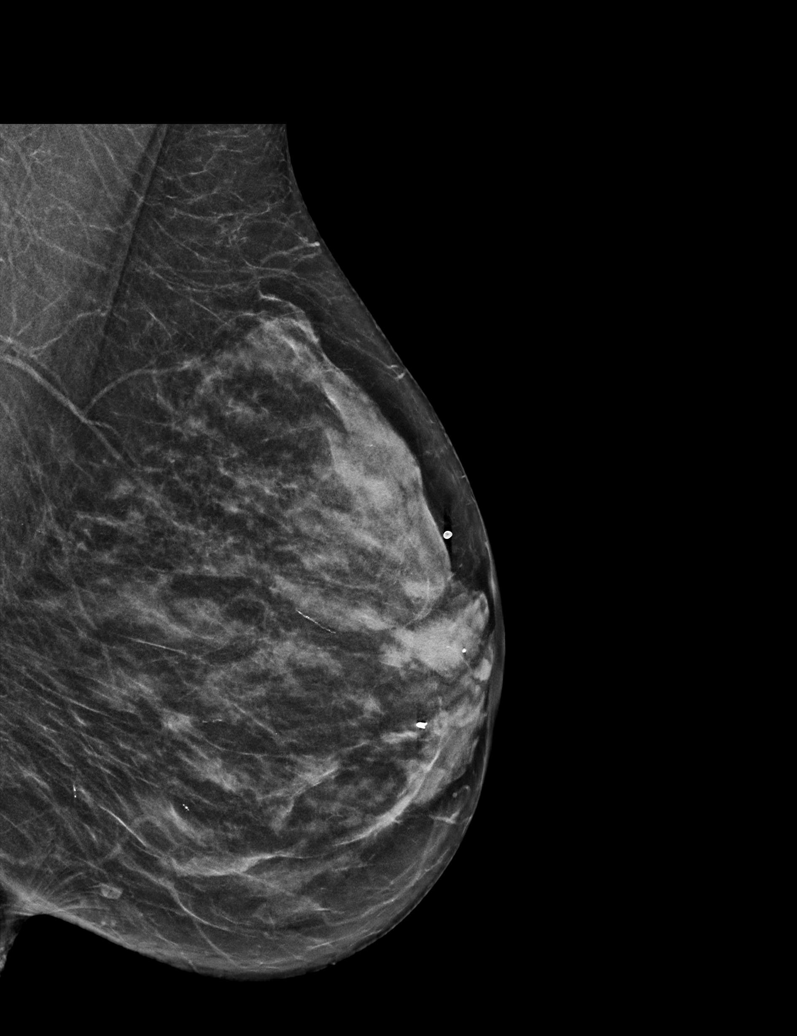

[L CC synth-2D]
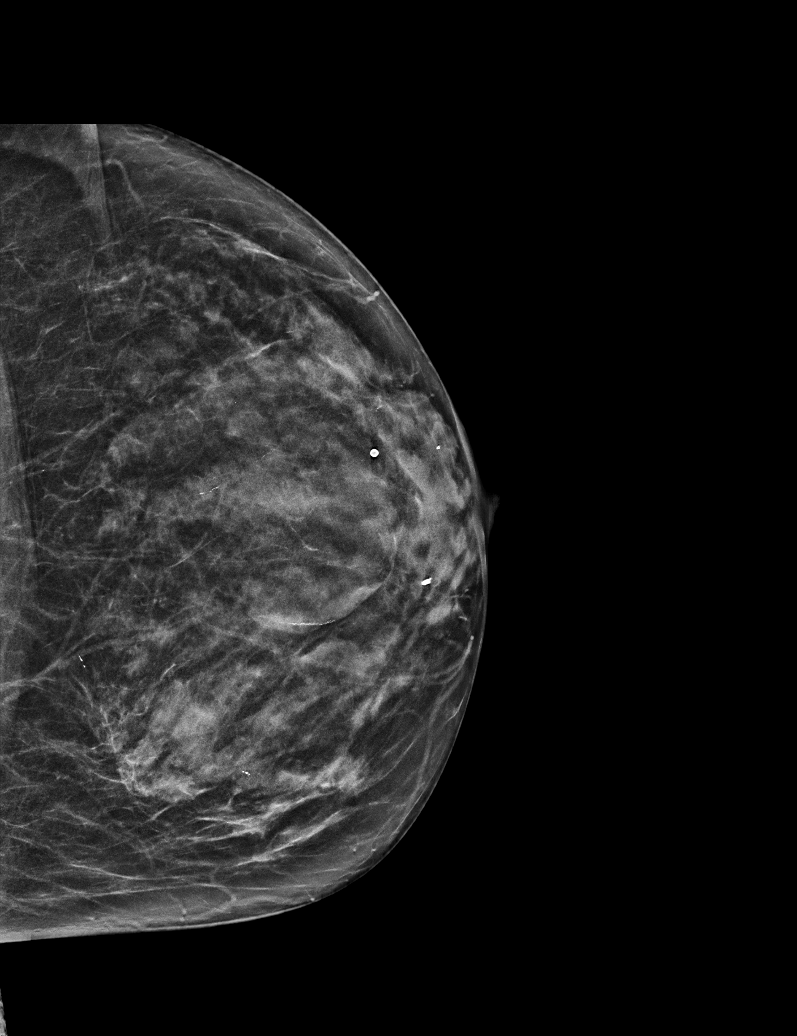

[L MLO tomo · tomo slice 27/52.0]
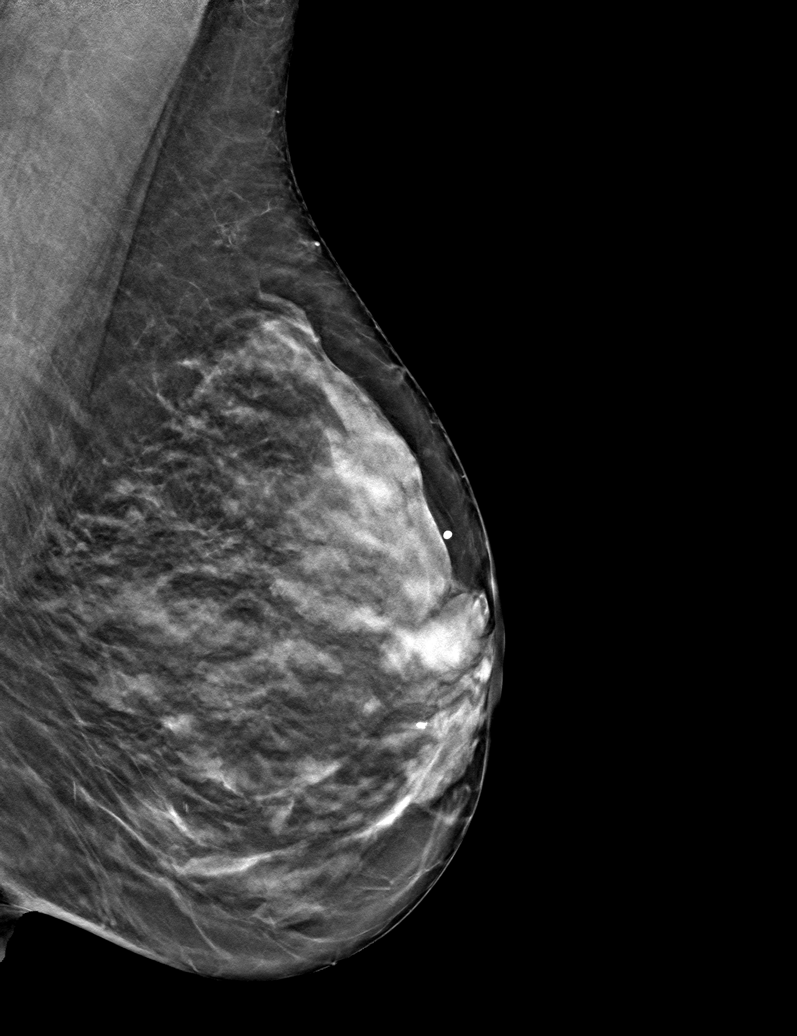

[6 of 30 positions shown; findings below may reference images not displayed]

ACR Breast Density Category c: The breast tissue is heterogeneously
dense, which may obscure small masses.
FINDINGS: There are no findings suspicious for malignancy.
IMPRESSION: No mammographic evidence of malignancy. A result letter of this
screening mammogram will be mailed directly to the patient.

RECOMMENDATION:
Screening mammogram in one year. (Code:Q3-[id number])

BI-RADS CATEGORY  1: Negative.

## 2022-07-24 ENCOUNTER — Other Ambulatory Visit: Payer: Self-pay

## 2022-08-28 ENCOUNTER — Ambulatory Visit: Payer: Medicare PPO | Admitting: Cardiology

## 2022-08-29 ENCOUNTER — Ambulatory Visit
Admission: RE | Admit: 2022-08-29 | Discharge: 2022-08-29 | Disposition: A | Discharge status: Home / Self Care | Payer: TRICARE For Life (TFL) | Source: Ambulatory Visit | Attending: Family Medicine | Admitting: Family Medicine

## 2022-08-29 DIAGNOSIS — M810 Age-related osteoporosis without current pathological fracture: Secondary | ICD-10-CM

## 2022-09-17 ENCOUNTER — Telehealth: Payer: Self-pay | Admitting: Cardiology

## 2022-09-17 NOTE — Telephone Encounter (Signed)
Spoke with the patient and advised her of heart rate of 58 when she last saw [place]. Advised on normal range is 60-100. She is scheduled to see Dr. Lalla Brothers on Friday 6/7 to discuss ablation vs. PPM. Advised he will answer further questions at that time.

## 2022-09-17 NOTE — Telephone Encounter (Signed)
New Message:       Patient would like to know whar was her last heart rate when she was here in the office. She also wants to know what is the normal heart rate for a person please?

## 2022-09-20 NOTE — Progress Notes (Signed)
Electrophysiology Office Follow up Visit Note:    Date:  09/21/2022   ID:  Suzanne Hunt, DOB 1951-01-24, MRN [id number]  PCP:  Suzanne James, MD  [facility] Cardiologist:  None  [facility] Electrophysiologist:  Suzanne Prude, MD    Interval History:    Suzanne Hunt is a 72 y.o. female who presents for a follow up visit.   I last saw the patient February 27, 2022 for her atrial flutter.  After our last appointment, the patient elected to proceed with a conservative management strategy.  The patient saw Dr. Raynaldo Opitz on June 28, 2022.  She is on Eliquis for stroke prophylaxis.  She recently called back in and requested an appointment to discuss atrial flutter ablation and possible pacemaker implant.  She is with her husband today in clinic.    Past medical, surgical, social and family history were reviewed.  ROS:   Please see the history of present illness.    All other systems reviewed and are negative.  EKGs/Labs/Other Studies Reviewed:    The following studies were reviewed today:  June 28, 2022 EKG shows atrial flutter with a ventricular rate of 58 bpm.  Typical appearing flutter.  Physical Exam:    VS:  BP 120/78   Pulse 72   Ht 5\' 3"  (1.6 m)   Wt 123 lb (55.8 kg)   SpO2 95%   BMI 21.79 kg/m     Wt Readings from Last 3 Encounters:  09/21/22 123 lb (55.8 kg)  06/28/22 130 lb (59 kg)  02/27/22 128 lb 9.6 oz (58.3 kg)     GEN:  Well nourished, well developed in no acute distress CARDIAC: RRR, no murmurs, rubs, gallops RESPIRATORY:  Clear to auscultation without rales, wheezing or rhonchi       ASSESSMENT:    1. Atrial flutter, chronic (HCC)   2. Primary hypertension    PLAN:    In order of problems listed above:  #Persistent atrial flutter No history of atrial fibrillation.  Have previously discussed treatment options including conservative medical management, antiarrhythmic drugs, cardioversion and catheter ablation.  She  has, up until this point, been asymptomatic.  We discussed the pros and cons of staying in atrial flutter during today's clinic appointment.  I do suspect that she would feel better being back in rhythm after the atrial function is restored.  We also discussed the possibility of stopping Eliquis after successful atrial flutter ablation as long as there was a heart rhythm monitoring strategy in place such as a loop recorder.  I discussed the catheter ablation procedure including its risks and recovery.  She would like some time to think about it before finalizing her decision which is very reasonable.  If she elects to proceed, we will plan for atrial flutter ablation followed 90 days later by loop recorder implant and stopping Eliquis.   Discussed treatment options today for AFL including antiarrhythmic drug therapy and ablation. Discussed risks, recovery and likelihood of success with each treatment strategy. Risk, benefits, and alternatives to EP study and radiofrequency ablation for afib were discussed. These risks include but are not limited to stroke, bleeding, vascular damage, tamponade, perforation, damage to the lungs, phrenic nerve and other structures, worsening renal function, and death.  Discussed potential need for repeat ablation procedures and antiarrhythmic drugs after an initial ablation. The patient understands these risks.  Carto, ICE, anesthesia are requested for the procedure.  Will also obtain CT PV protocol prior to the procedure to  exclude LAA thrombus and further evaluate atrial anatomy.  She will call us should she decide to proceed with catheter ablation.  #Hypertension At goal today.  Recommend checking blood pressures 1-2 times per week at home and recording the values.  Recommend bringing these recordings to the primary care physician.      Signed, Steffanie Dunn, MD, [facility], [facility] 09/21/2022 2:11 PM    Electrophysiology [facility]

## 2022-09-21 ENCOUNTER — Ambulatory Visit: Payer: Medicare PPO | Attending: Cardiology | Admitting: Cardiology

## 2022-09-21 VITALS — BP 120/78 | HR 72 | Ht 63.0 in | Wt 123.0 lb

## 2022-09-21 DIAGNOSIS — I10 Essential (primary) hypertension: Secondary | ICD-10-CM | POA: Diagnosis not present

## 2022-09-21 DIAGNOSIS — I4892 Unspecified atrial flutter: Secondary | ICD-10-CM | POA: Diagnosis not present

## 2022-09-21 NOTE — Patient Instructions (Signed)
Medication Instructions:  Your physician recommends that you continue on your current medications as directed. Please refer to the Current Medication list given to you today.  *If you need a refill on your cardiac medications before your next appointment, please call your pharmacy*  Follow-Up: At [facility], you and your health needs are our priority.  As part of our continuing mission to provide you with exceptional heart care, we have created designated Provider Care Teams.  These Care Teams include your primary Cardiologist (physician) and Advanced Practice Providers (APPs -  Physician Assistants and Nurse Practitioners) who all work together to provide you with the care you need, when you need it.   Your next appointment:   Call us if you decide that you would like to proceed with an ablation [phone]

## 2022-09-22 ENCOUNTER — Other Ambulatory Visit: Payer: Self-pay | Admitting: Cardiology

## 2022-09-25 ENCOUNTER — Telehealth: Payer: Self-pay | Admitting: Cardiology

## 2022-09-25 DIAGNOSIS — I4892 Unspecified atrial flutter: Secondary | ICD-10-CM

## 2022-09-25 NOTE — Telephone Encounter (Signed)
Patient is returning call. Requesting return call.  

## 2022-09-25 NOTE — Telephone Encounter (Signed)
Patient is requesting to schedule an ablation with Dr. Lalla Brothers.

## 2022-09-25 NOTE — Telephone Encounter (Signed)
Spoke with the patient and scheduled her for an ablation with Dr. Lalla Brothers. Labs and CT scan have been ordered.

## 2022-09-25 NOTE — Telephone Encounter (Signed)
Left message for patient to call back  

## 2022-09-28 ENCOUNTER — Telehealth: Payer: Self-pay | Admitting: Cardiology

## 2022-09-28 NOTE — Telephone Encounter (Signed)
Pt calling to see if procedure date can be moved up or she be added to the cancellation list. Please advise

## 2022-09-28 NOTE — Telephone Encounter (Signed)
Called pt to advise her that he doesn't have anything any sooner.  I told her if anything comes available I would let her know.

## 2022-11-20 ENCOUNTER — Ambulatory Visit: Payer: Medicare PPO | Attending: Cardiology

## 2022-11-20 ENCOUNTER — Other Ambulatory Visit: Payer: Self-pay | Admitting: Family Medicine

## 2022-11-20 DIAGNOSIS — I4892 Unspecified atrial flutter: Secondary | ICD-10-CM | POA: Diagnosis not present

## 2022-11-20 DIAGNOSIS — Z1231 Encounter for screening mammogram for malignant neoplasm of breast: Secondary | ICD-10-CM

## 2022-11-20 LAB — BASIC METABOLIC PANEL
BUN/Creatinine Ratio: 18 (ref 12–28)
BUN: 24 mg/dL (ref 8–27)
CO2: 25 mmol/L (ref 20–29)
Calcium: 8.9 mg/dL (ref 8.7–10.3)
Chloride: 103 mmol/L (ref 96–106)
Creatinine, Ser: 1.32 mg/dL — ABNORMAL HIGH (ref 0.57–1.00)
Glucose: 78 mg/dL (ref 70–99)
Potassium: 4.2 mmol/L (ref 3.5–5.2)
Sodium: 141 mmol/L (ref 134–144)
eGFR: 43 mL/min/{1.73_m2} — ABNORMAL LOW (ref >59)

## 2022-11-20 LAB — CBC

## 2022-11-27 ENCOUNTER — Ambulatory Visit (HOSPITAL_COMMUNITY)
Admission: RE | Admit: 2022-11-27 | Discharge: 2022-11-27 | Disposition: A | Discharge status: Home / Self Care | Payer: Medicare PPO | Source: Ambulatory Visit | Attending: Cardiology | Admitting: Cardiology

## 2022-11-27 DIAGNOSIS — I4891 Unspecified atrial fibrillation: Secondary | ICD-10-CM | POA: Insufficient documentation

## 2022-11-27 DIAGNOSIS — I4892 Unspecified atrial flutter: Secondary | ICD-10-CM | POA: Diagnosis not present

## 2022-11-27 DIAGNOSIS — I10 Essential (primary) hypertension: Secondary | ICD-10-CM | POA: Diagnosis not present

## 2022-11-27 MED ORDER — IOHEXOL 350 MG/ML SOLN
100.0000 mL | Freq: Once | INTRAVENOUS | Status: AC | PRN
  Administered 2022-11-27: 100 mL via INTRAVENOUS

## 2022-11-28 NOTE — Telephone Encounter (Signed)
Done

## 2022-12-03 NOTE — Pre-Procedure Instructions (Signed)
Instructed patient on the following items: Arrival time 1100 Nothing to eat or drink after midnight No meds AM of procedure Responsible person to drive you home and stay with you for 24 hrs  Have you missed any doses of anti-coagulant Eliquis- takes twice a day, hasn't missed any doses.  Don't take dose in the morning.

## 2022-12-03 NOTE — Anesthesia Preprocedure Evaluation (Signed)
Anesthesia Evaluation  Patient identified by MRN, date of birth, ID band Patient awake    Reviewed: Allergy & Precautions, NPO status , Patient's Chart, lab work & pertinent test results  History of Anesthesia Complications Negative for: history of anesthetic complications  Airway Mallampati: III  TM Distance: >3 FB Neck ROM: Full    Dental  (+) Dental Advisory Given   Pulmonary neg pulmonary ROS   Pulmonary exam normal breath sounds clear to auscultation       Cardiovascular hypertension (losartan, metoprolol), Pt. on medications and Pt. on home beta blockers (-) angina + CAD and + Past MI (11/2017)  + dysrhythmias Atrial Fibrillation + Valvular Problems/Murmurs (moderate MR, mild TR)  Rhythm:Regular Rate:Normal  HLD  Echocardiogram 12/21/2021: Normal LV systolic function with visual EF 55-60%. Left ventricle cavity is normal in size. Normal left ventricular wall thickness. Normal global wall motion. Unable to evaluate diastolic function due to atrial flutter. Normal LAP. Trace aortic regurgitation. Moderate (Grade II) mitral regurgitation. Mild tricuspid regurgitation. No evidence of pulmonary hypertension.  Left Heart Catheterization 11/08/20:  LV: LV pressure: 107/0, EDP 5 mmHg.  Aortic pressure 100/49, mean 69 mmHg.  There is no pressure gradient across the aortic valve.  Low normal LV systolic function 50% with mid to distal anterolateral hypokinesis. No mitral regurgitation.   Left main: Long vessel, mild calcification is noted. LAD: Large vessel, mild diffuse coronary calcification is evident.  There is minimal disease in the midsegment at most 20 to 30%.  Previously performed balloon angioplasty site on 12/08/2017 the mid LAD and apical LAD is widely patent. CX: Moderate caliber vessel, smooth and normal.  Mild coronary calcification is evident. RCA: Mid segment has a 30% stenosis.  Otherwise vessel is smooth.  There is  mild calcification noted in the proximal RCA.     Neuro/Psych negative neurological ROS     GI/Hepatic Neg liver ROS,GERD  Medicated,,  Endo/Other  negative endocrine ROS    Renal/GU negative Renal ROS     Musculoskeletal   Abdominal   Peds  Hematology negative hematology ROS (+) Lab Results      Component                Value               Date                      WBC                      3.1 (L)             11/20/2022                HGB                      11.4                11/20/2022                HCT                      35.5                11/20/2022                MCV  93                  11/20/2022                PLT                      142 (L)             11/20/2022              Anesthesia Other Findings   Reproductive/Obstetrics                             Anesthesia Physical Anesthesia Plan  ASA: 3  Anesthesia Plan: General   Post-op Pain Management: Tylenol PO (pre-op)*   Induction: Intravenous  PONV Risk Score and Plan: 3 and Ondansetron, Dexamethasone and Treatment may vary due to age or medical condition  Airway Management Planned: Oral ETT  Additional Equipment:   Intra-op Plan:   Post-operative Plan: Extubation in OR  Informed Consent: I have reviewed the patients History and Physical, chart, labs and discussed the procedure including the risks, benefits and alternatives for the proposed anesthesia with the patient or authorized representative who has indicated his/her understanding and acceptance.     Dental advisory given  Plan Discussed with: Anesthesiologist and CRNA  Anesthesia Plan Comments: (Risks of general anesthesia discussed including, but not limited to, sore throat, hoarse voice, chipped/damaged teeth, injury to vocal cords, nausea and vomiting, allergic reactions, lung infection, heart attack, stroke, and death. All questions answered. )       Anesthesia Quick  Evaluation

## 2022-12-04 ENCOUNTER — Ambulatory Visit (HOSPITAL_COMMUNITY): Payer: Medicare PPO | Admitting: Anesthesiology

## 2022-12-04 ENCOUNTER — Encounter (HOSPITAL_COMMUNITY)
Admission: RE | Disposition: A | Discharge status: Home / Self Care | Payer: Self-pay | Source: Ambulatory Visit | Attending: Cardiology

## 2022-12-04 ENCOUNTER — Other Ambulatory Visit (HOSPITAL_COMMUNITY): Payer: Self-pay

## 2022-12-04 ENCOUNTER — Ambulatory Visit (HOSPITAL_COMMUNITY): Admission: RE | Admit: 2022-12-04 | Payer: Medicare PPO | Source: Ambulatory Visit | Admitting: Cardiology

## 2022-12-04 ENCOUNTER — Ambulatory Visit (HOSPITAL_BASED_OUTPATIENT_CLINIC_OR_DEPARTMENT_OTHER): Payer: Medicare PPO | Admitting: Anesthesiology

## 2022-12-04 DIAGNOSIS — I4891 Unspecified atrial fibrillation: Secondary | ICD-10-CM | POA: Diagnosis not present

## 2022-12-04 DIAGNOSIS — Z79899 Other long term (current) drug therapy: Secondary | ICD-10-CM | POA: Insufficient documentation

## 2022-12-04 DIAGNOSIS — I351 Nonrheumatic aortic (valve) insufficiency: Secondary | ICD-10-CM

## 2022-12-04 DIAGNOSIS — I213 ST elevation (STEMI) myocardial infarction of unspecified site: Secondary | ICD-10-CM | POA: Diagnosis not present

## 2022-12-04 DIAGNOSIS — I10 Essential (primary) hypertension: Secondary | ICD-10-CM | POA: Insufficient documentation

## 2022-12-04 DIAGNOSIS — I483 Typical atrial flutter: Secondary | ICD-10-CM

## 2022-12-04 DIAGNOSIS — I4892 Unspecified atrial flutter: Secondary | ICD-10-CM

## 2022-12-04 DIAGNOSIS — I484 Atypical atrial flutter: Secondary | ICD-10-CM | POA: Insufficient documentation

## 2022-12-04 DIAGNOSIS — I2510 Atherosclerotic heart disease of native coronary artery without angina pectoris: Secondary | ICD-10-CM

## 2022-12-04 HISTORY — PX: A-FLUTTER ABLATION: EP1230

## 2022-12-04 SURGERY — A-FLUTTER ABLATION
Anesthesia: General

## 2022-12-04 MED ORDER — ACETAMINOPHEN 500 MG PO TABS
1000.0000 mg | ORAL_TABLET | Freq: Once | ORAL | Status: AC
  Administered 2022-12-04: 1000 mg via ORAL
  Filled 2022-12-04: qty 2

## 2022-12-04 MED ORDER — EPHEDRINE SULFATE (PRESSORS) 50 MG/ML IJ SOLN
INTRAMUSCULAR | Status: DC | PRN
  Administered 2022-12-04 (×2): 5 mg via INTRAVENOUS

## 2022-12-04 MED ORDER — ONDANSETRON HCL 4 MG/2ML IJ SOLN
INTRAMUSCULAR | Status: DC | PRN
  Administered 2022-12-04: 4 mg via INTRAVENOUS

## 2022-12-04 MED ORDER — HEPARIN SODIUM (PORCINE) 1000 UNIT/ML IJ SOLN
INTRAMUSCULAR | Status: DC | PRN
  Administered 2022-12-04: 1000 [IU] via INTRAVENOUS

## 2022-12-04 MED ORDER — METOPROLOL SUCCINATE ER 25 MG PO TB24
25.0000 mg | ORAL_TABLET | Freq: Every day | ORAL | 11 refills | Status: DC
Start: 2022-12-04 — End: 2023-01-03
  Filled 2022-12-04: qty 30, 30d supply, fill #0

## 2022-12-04 MED ORDER — PROPOFOL 10 MG/ML IV BOLUS
INTRAVENOUS | Status: DC | PRN
  Administered 2022-12-04: 120 mg via INTRAVENOUS

## 2022-12-04 MED ORDER — PHENYLEPHRINE HCL-NACL 20-0.9 MG/250ML-% IV SOLN: INTRAVENOUS | Status: DC | PRN

## 2022-12-04 MED ORDER — APIXABAN 5 MG PO TABS
5.0000 mg | ORAL_TABLET | Freq: Two times a day (BID) | ORAL | Status: DC
  Administered 2022-12-04: 5 mg via ORAL
  Filled 2022-12-04 (×2): qty 1

## 2022-12-04 MED ORDER — ROCURONIUM BROMIDE 10 MG/ML (PF) SYRINGE
PREFILLED_SYRINGE | INTRAVENOUS | Status: DC | PRN
  Administered 2022-12-04: 40 mg via INTRAVENOUS

## 2022-12-04 MED ORDER — SODIUM CHLORIDE 0.9 % IV SOLN: 250.0000 mL | INTRAVENOUS | Status: DC | PRN

## 2022-12-04 MED ORDER — LIDOCAINE 2% (20 MG/ML) 5 ML SYRINGE
INTRAMUSCULAR | Status: DC | PRN
  Administered 2022-12-04: 60 mg via INTRAVENOUS

## 2022-12-04 MED ORDER — ONDANSETRON HCL 4 MG/2ML IJ SOLN: 4.0000 mg | Freq: Four times a day (QID) | INTRAMUSCULAR | Status: DC | PRN

## 2022-12-04 MED ORDER — AMIODARONE IV BOLUS ONLY 150 MG/100ML
INTRAVENOUS | Status: DC | PRN
  Administered 2022-12-04: 150 mg via INTRAVENOUS

## 2022-12-04 MED ORDER — HEPARIN SODIUM (PORCINE) 1000 UNIT/ML IJ SOLN
INTRAMUSCULAR | Status: AC
  Filled 2022-12-04: qty 10

## 2022-12-04 MED ORDER — SODIUM CHLORIDE 0.9% FLUSH: 3.0000 mL | Freq: Two times a day (BID) | INTRAVENOUS | Status: DC

## 2022-12-04 MED ORDER — ACETAMINOPHEN 325 MG PO TABS: 650.0000 mg | ORAL_TABLET | ORAL | Status: DC | PRN

## 2022-12-04 MED ORDER — AMIODARONE HCL 200 MG PO TABS
200.0000 mg | ORAL_TABLET | Freq: Every day | ORAL | 5 refills | Status: DC
  Filled 2022-12-04: qty 35, 35d supply, fill #0

## 2022-12-04 MED ORDER — AMIODARONE HCL 150 MG/3ML IV SOLN
INTRAVENOUS | Status: AC
  Filled 2022-12-04: qty 3

## 2022-12-04 MED ORDER — SODIUM CHLORIDE 0.9% FLUSH: 3.0000 mL | INTRAVENOUS | Status: DC | PRN

## 2022-12-04 MED ORDER — SUGAMMADEX SODIUM 200 MG/2ML IV SOLN
INTRAVENOUS | Status: DC | PRN
  Administered 2022-12-04: 150 mg via INTRAVENOUS
  Administered 2022-12-04: 50 mg via INTRAVENOUS

## 2022-12-04 MED ORDER — SODIUM CHLORIDE 0.9 % IV SOLN: INTRAVENOUS | Status: DC

## 2022-12-04 SURGICAL SUPPLY — 15 items
BAG SNAP BAND KOVER 36X36 (MISCELLANEOUS) IMPLANT
CATH 8FR REPROCESSED SOUNDSTAR (CATHETERS) ×1 IMPLANT
CATH 8FR SOUNDSTAR REPROCESSED (CATHETERS) IMPLANT
CATH SMTCH THERMOCOOL SF DF (CATHETERS) IMPLANT
CATH SMTCH THERMOCOOL SF FJ (CATHETERS) IMPLANT
CATH WEB BI DIR CSDF CRV REPRO (CATHETERS) IMPLANT
CLOSURE PERCLOSE PROSTYLE (VASCULAR PRODUCTS) IMPLANT
PACK EP LATEX FREE (CUSTOM PROCEDURE TRAY) ×1
PACK EP LF (CUSTOM PROCEDURE TRAY) ×1 IMPLANT
PAD DEFIB RADIO PHYSIO CONN (PAD) ×1 IMPLANT
PATCH CARTO3 (PAD) IMPLANT
SHEATH PINNACLE 8F 10CM (SHEATH) IMPLANT
SHEATH PINNACLE 9F 10CM (SHEATH) IMPLANT
SHEATH PROBE COVER 6X72 (BAG) IMPLANT
TUBING SMART ABLATE COOLFLOW (TUBING) IMPLANT

## 2022-12-04 NOTE — H&P (Signed)
Electrophysiology Office Follow up Visit Note:     Date:  12/04/2022    ID:  Suzanne Hunt, DOB May 03, 1950, MRN [id number]   PCP:  [name], MD          [facility] Cardiologist:  None  [facility] Electrophysiologist:  Lanier Prude, MD      Interval History:     Suzanne Hunt is a 72 y.o. female who presents for a follow up visit.    I last saw the patient February 27, 2022 for her atrial flutter.  After our last appointment, the patient elected to proceed with a conservative management strategy.  The patient saw Dr. Raynaldo Opitz on June 28, 2022.  She is on Eliquis for stroke prophylaxis.   She recently called back in and requested an appointment to discuss atrial flutter ablation and possible pacemaker implant.     Presents for atrial flutter ablation today.   Objective Past medical, surgical, social and family history were reviewed.   ROS:   Please see the history of present illness.    All other systems reviewed and are negative.   EKGs/Labs/Other Studies Reviewed:     The following studies were reviewed today:   June 28, 2022 EKG shows atrial flutter with a ventricular rate of 58 bpm.  Typical appearing flutter.   Physical Exam:     VS:  BP 120/78   Pulse 72   Ht 5\' 3"  (1.6 m)   Wt 123 lb (55.8 kg)   SpO2 95%   BMI 21.79 kg/m         Wt Readings from Last 3 Encounters:  09/21/22 123 lb (55.8 kg)  06/28/22 130 lb (59 kg)  02/27/22 128 lb 9.6 oz (58.3 kg)      GEN:  Well nourished, well developed in no acute distress CARDIAC: RRR, no murmurs, rubs, gallops RESPIRATORY:  Clear to auscultation without rales, wheezing or rhonchi          Assessment ASSESSMENT:     1. Atrial flutter, chronic (HCC)   2. Primary hypertension     PLAN:     In order of problems listed above:   #Persistent atrial flutter No history of atrial fibrillation.  Have previously discussed treatment options including conservative medical management,  antiarrhythmic drugs, cardioversion and catheter ablation.  She has, up until this point, been asymptomatic.  We discussed the pros and cons of staying in atrial flutter during today's clinic appointment.  I do suspect that she would feel better being back in rhythm after the atrial function is restored.  We also discussed the possibility of stopping Eliquis after successful atrial flutter ablation as long as there was a heart rhythm monitoring strategy in place such as a loop recorder.  I discussed the catheter ablation procedure including its risks and recovery.     Discussed treatment options today for AFL including antiarrhythmic drug therapy and ablation. Discussed risks, recovery and likelihood of success with each treatment strategy. Risk, benefits, and alternatives to EP study and radiofrequency ablation for afib were discussed. These risks include but are not limited to stroke, bleeding, vascular damage, tamponade, perforation, damage to the lungs, phrenic nerve and other structures, worsening renal function, and death.  Discussed potential need for repeat ablation procedures and antiarrhythmic drugs after an initial ablation. The patient understands these risks.  Carto, ICE, anesthesia are requested for the procedure.  Will also obtain CT PV protocol prior to the procedure to exclude LAA thrombus and  further evaluate atrial anatomy.   Presents for atrial flutter ablation. Procedure reviewed.       Signed, Steffanie Dunn, MD, [facility], [facility] 12/04/2022 Electrophysiology [facility]

## 2022-12-04 NOTE — Transfer of Care (Signed)
Immediate Anesthesia Transfer of Care Note  Patient: Alejandra Diebert  Procedure(s) Performed: A-FLUTTER ABLATION  Patient Location: Cath Lab  Anesthesia Type:General  Level of Consciousness: awake, patient cooperative, and responds to stimulation  Airway & Oxygen Therapy: Patient Spontanous Breathing and Patient connected to nasal cannula oxygen  Post-op Assessment: Report given to RN and Post -op Vital signs reviewed and stable  Post vital signs: Reviewed and stable  Last Vitals:  Vitals Value Taken Time  BP 117/64 12/04/22 1430  Temp    Pulse 51 12/04/22 1432  Resp 15 12/04/22 1432  SpO2 98 % 12/04/22 1432  Vitals shown include unfiled device data.  Last Pain:  Vitals:   12/04/22 1158  TempSrc:   PainSc: 0-No pain         Complications: There were no known notable events for this encounter.

## 2022-12-04 NOTE — Discharge Instructions (Signed)

## 2022-12-04 NOTE — Progress Notes (Signed)
Pt instructed not to bend to do socks and shoes or pick up items from floor, pt states she is not bending if sitting on  or laying in bed to dress, I informed her that it is bending the site increasing risk of bleeding, husband is available to help her,

## 2022-12-04 NOTE — Anesthesia Postprocedure Evaluation (Signed)
Anesthesia Post Note  Patient: Suzanne Hunt  Procedure(s) Performed: A-FLUTTER ABLATION     Patient location during evaluation: PACU Anesthesia Type: General Level of consciousness: awake Pain management: pain level controlled Vital Signs Assessment: post-procedure vital signs reviewed and stable Respiratory status: spontaneous breathing, nonlabored ventilation and respiratory function stable Cardiovascular status: blood pressure returned to baseline and stable Postop Assessment: no apparent nausea or vomiting Anesthetic complications: no   There were no known notable events for this encounter.  Last Vitals:  Vitals:   12/04/22 1455 12/04/22 1458  BP: 118/61   Pulse: (!) 47   Resp: 13   Temp:  36.6 C  SpO2: 98%     Last Pain:  Vitals:   12/04/22 1458  TempSrc: Temporal  PainSc: 5                  Linton Rump

## 2022-12-05 ENCOUNTER — Encounter (HOSPITAL_COMMUNITY): Payer: Self-pay | Admitting: Cardiology

## 2022-12-07 ENCOUNTER — Telehealth: Payer: Self-pay | Admitting: Cardiology

## 2022-12-07 NOTE — Telephone Encounter (Signed)
Pt states that since having procedure on Monday she has been experiencing right shoulder and neck pain. As well as lower abdomen pain and coughing. She would like a callback regarding this matter. Pleas advise

## 2022-12-07 NOTE — Telephone Encounter (Signed)
Patient states that she has been pain in her right shoulder and right side of her neck since her ablation. She states that it hurts when she tries to use her right arm. She also has pain on the right side of her neck when she coughs. When asked to describe the pain she states "I can't describe it". She denies that it is sharp pain. She states that she has also had some lightheadedness. She has not checked her HR or blood pressure which I have advised her to start monitoring. Encouraged her to stay hydrated. She denies chest pain or shortness of breath. Advised to take some tylenol for pain and apply some heat to the area to see if that can give her some relief. Advised to call back if she has any new or worsening symptoms.

## 2022-12-21 ENCOUNTER — Ambulatory Visit: Payer: Medicare PPO

## 2022-12-21 DIAGNOSIS — I129 Hypertensive chronic kidney disease with stage 1 through stage 4 chronic kidney disease, or unspecified chronic kidney disease: Secondary | ICD-10-CM | POA: Diagnosis not present

## 2022-12-21 DIAGNOSIS — N1831 Chronic kidney disease, stage 3a: Secondary | ICD-10-CM | POA: Diagnosis not present

## 2022-12-21 DIAGNOSIS — M810 Age-related osteoporosis without current pathological fracture: Secondary | ICD-10-CM | POA: Diagnosis not present

## 2022-12-21 DIAGNOSIS — I25118 Atherosclerotic heart disease of native coronary artery with other forms of angina pectoris: Secondary | ICD-10-CM | POA: Diagnosis not present

## 2022-12-21 DIAGNOSIS — Z0000 Encounter for general adult medical examination without abnormal findings: Secondary | ICD-10-CM | POA: Diagnosis not present

## 2022-12-21 DIAGNOSIS — I4891 Unspecified atrial fibrillation: Secondary | ICD-10-CM | POA: Diagnosis not present

## 2022-12-21 DIAGNOSIS — E7800 Pure hypercholesterolemia, unspecified: Secondary | ICD-10-CM | POA: Diagnosis not present

## 2022-12-21 DIAGNOSIS — K219 Gastro-esophageal reflux disease without esophagitis: Secondary | ICD-10-CM | POA: Diagnosis not present

## 2022-12-21 DIAGNOSIS — I700 Atherosclerosis of aorta: Secondary | ICD-10-CM | POA: Diagnosis not present

## 2022-12-25 ENCOUNTER — Encounter (HOSPITAL_COMMUNITY): Payer: Self-pay | Admitting: Physician Assistant

## 2022-12-25 ENCOUNTER — Ambulatory Visit (HOSPITAL_COMMUNITY)
Admit: 2022-12-25 | Discharge: 2022-12-25 | Disposition: A | Discharge status: Home / Self Care | Payer: Medicare PPO | Attending: Physician Assistant | Admitting: Physician Assistant

## 2022-12-25 VITALS — BP 132/76 | HR 48 | Ht 63.0 in | Wt 126.8 lb

## 2022-12-25 DIAGNOSIS — D6869 Other thrombophilia: Secondary | ICD-10-CM | POA: Insufficient documentation

## 2022-12-25 DIAGNOSIS — I4819 Other persistent atrial fibrillation: Secondary | ICD-10-CM | POA: Insufficient documentation

## 2022-12-25 DIAGNOSIS — I484 Atypical atrial flutter: Secondary | ICD-10-CM | POA: Diagnosis not present

## 2022-12-25 DIAGNOSIS — Z79899 Other long term (current) drug therapy: Secondary | ICD-10-CM | POA: Diagnosis not present

## 2022-12-25 DIAGNOSIS — I10 Essential (primary) hypertension: Secondary | ICD-10-CM | POA: Diagnosis not present

## 2022-12-25 DIAGNOSIS — Z7901 Long term (current) use of anticoagulants: Secondary | ICD-10-CM | POA: Insufficient documentation

## 2022-12-25 DIAGNOSIS — E785 Hyperlipidemia, unspecified: Secondary | ICD-10-CM | POA: Insufficient documentation

## 2022-12-25 DIAGNOSIS — I4430 Unspecified atrioventricular block: Secondary | ICD-10-CM | POA: Diagnosis not present

## 2022-12-25 DIAGNOSIS — I2510 Atherosclerotic heart disease of native coronary artery without angina pectoris: Secondary | ICD-10-CM | POA: Insufficient documentation

## 2022-12-25 DIAGNOSIS — I252 Old myocardial infarction: Secondary | ICD-10-CM | POA: Diagnosis not present

## 2022-12-25 DIAGNOSIS — Z5181 Encounter for therapeutic drug level monitoring: Secondary | ICD-10-CM | POA: Diagnosis not present

## 2022-12-25 NOTE — Progress Notes (Signed)
Primary Care Physician: [name], MD Primary Cardiologist: [name], DO Electrophysiologist: Lanier Prude, MD  Referring Physician: Dr Barb Merino Eisele Downham is a 72 y.o. female with a history of HTN, HLD, CAD s/p STEMI 2019, atrial flutter, atrial fibrillation who presents for follow up in the [facility].  The patient was initially diagnosed with typical atrial flutter and underwent CTI ablation on 12/04/22. However, atypical atrial flutter and afib were induced during the procedure. Decision was made to manage medically and she was started on amiodarone. Patient is on Eliquis for a CHADS2VASC score of 4.  On follow up today, she remains in atypical flutter with slow V rate. She remains very active by walking daily. She can feel a "fluttering" sensation when she lays on her left side. No bleeding issues on anticoagulation. She has an occasional, brief discomfort in her chest since the ablation, not associated with activity. She denies any groin issues.   Today, she denies symptoms of shortness of breath, orthopnea, PND, lower extremity edema, dizziness, presyncope, syncope, snoring, daytime somnolence, bleeding, or neurologic sequela. The patient is tolerating medications without difficulties and is otherwise without complaint today.    Atrial Fibrillation Risk Factors:  she does not have symptoms or diagnosis of sleep apnea. she does not have a history of rheumatic fever.   Atrial Fibrillation Management history:  Previous antiarrhythmic drugs: amiodarone  Previous cardioversions: none Previous ablations: 12/04/22 CTI Anticoagulation history: Eliquis  ROS- All systems are reviewed and negative except as per the HPI above.  Past Medical History:  Diagnosis Date   CAD (coronary artery disease) 01/07/2019   Coronary artery disease    HTN (hypertension) 01/07/2019   Hypertension    Medical history non-contributory    Myocardial  infarction [facility])    STEMI involving left anterior descending coronary artery (HCC) 12/08/2017   Balloon PTCA of the Distal LAD    Current Outpatient Medications  Medication Sig Dispense Refill   amiodarone (PACERONE) 200 MG tablet Take 1 tablet (200 mg total) by mouth daily. START by taking amiodarone 200mg  (1 tablet) twice daily for 5 days THEN reduce to 200mg  once daily 35 tablet 5   apixaban (ELIQUIS) 5 MG TABS tablet Take 1 tablet (5 mg total) by mouth 2 (two) times daily. 180 tablet 3   Calcium Carb-Cholecalciferol (CALCIUM 500 + D3 PO) Take 1 tablet by mouth daily.     ibandronate (BONIVA) 150 MG tablet Take 150 mg by mouth every 30 (thirty) days. Take in the morning with a full glass of water, on an empty stomach, and do not take anything else by mouth or lie down for the next 30 min. Takes the first week of the month.     losartan (COZAAR) 25 MG tablet TAKE 1 TABLET EVERY EVENING 90 tablet 3   metoprolol succinate (TOPROL XL) 25 MG 24 hr tablet Take 1 tablet (25 mg total) by mouth daily. 30 tablet 11   Multiple Vitamin (MULTIVITAMIN WITH MINERALS) TABS tablet Take 1 tablet by mouth daily.     nitroGLYCERIN (NITROSTAT) 0.4 MG SL tablet Place 0.4 mg under the tongue every 5 (five) minutes as needed for chest pain.     Omega-3 Fatty Acids (FISH OIL ADULT GUMMIES PO) Take 2 each by mouth daily.     omeprazole (PRILOSEC) 20 MG capsule Take 20 mg by mouth daily as needed (heartburn/indigestion).     Polyvinyl Alcohol-Povidone PF 1.4-0.6 % SOLN Place 1 drop  into both eyes daily as needed (dry/irritated eyes.).     rosuvastatin (CRESTOR) 20 MG tablet Take 1 tablet (20 mg total) by mouth daily. 10 tablet 0   No current facility-administered medications for this encounter.    Physical Exam: BP 132/76   Pulse (!) 48   Ht 5\' 3"  (1.6 m)   Wt 57.5 kg   BMI 22.46 kg/m   GEN: Well nourished, well developed in no acute distress NECK: No JVD; No carotid bruits CARDIAC: Irregularly irregular  rate and rhythm, no murmurs, rubs, gallops RESPIRATORY:  Clear to auscultation without rales, wheezing or rhonchi  ABDOMEN: Soft, non-tender, non-distended EXTREMITIES:  No edema; No deformity   Wt Readings from Last 3 Encounters:  12/25/22 57.5 kg  12/04/22 56.7 kg  09/21/22 55.8 kg     EKG today demonstrates  Atypical atrial flutter with variable block Vent. rate 48 BPM PR interval * ms QRS duration 70 ms QT/QTcB 458/409 ms   Echo 12/21/21 demonstrated  Echocardiogram 12/21/2021: Normal LV systolic function with visual EF 55-60%. Left ventricle cavity is normal in size. Normal left ventricular wall thickness. Normal globalwall motion. Unable to evaluate diastolic function due to atrial flutter. Normal LAP. Trace aortic regurgitation. Moderate (Grade II) mitral regurgitation. Mild tricuspid regurgitation. No evidence of pulmonary hypertension. Compared to 07/24/2019: MR remains stable, moderate LAE is now normal, otherwise no significant change.     CHA2DS2-VASc Score = 4  The patient's score is based upon: CHF History: 0 HTN History: 1 Diabetes History: 0 Stroke History: 0 Vascular Disease History: 1 Age Score: 1 Gender Score: 1       ASSESSMENT AND PLAN: Persistent Atrial Fibrillation/atrial flutter The patient's CHA2DS2-VASc score is 4, indicating a 4.8% annual risk of stroke.   S/p CTI ablation 12/04/22, also had inducible atypical flutter and afib. Started on amiodarone Patient remains in atypical atrial flutter today. We discussed rhythm vs rate control. We specifically discussed DCCV. She would like to take time to consider her options. Encouraged her to arrange DCCV with Dr Odis Hollingshead if she decides to proceed.  Continue amiodarone 200 mg daily Continue Eliquis 5 mg BID Continue Toprol 25 mg daily. She is asymptomatic with her bradycardia, may need to decrease BB dose once back in SR.   Secondary Hypercoagulable State (ICD10:  D68.69) The patient is at  significant risk for stroke/thromboembolism based upon her CHA2DS2-VASc Score of 4.  Continue Apixaban (Eliquis).   CAD S/p STEMI 2019 On statin No anginal symptoms  HTN Stable on current regimen    Follow up with Dr Lalla Brothers as scheduled.        Jorja Loa PA-C Afib Clinic [facility] [address] [place], [place] [address] [phone]

## 2022-12-25 NOTE — H&P (View-Only) (Signed)
Primary Care Physician: [name], MD Primary Cardiologist: [name], DO Electrophysiologist: Lanier Prude, MD  Referring Physician: Dr Barb Merino Suzanne Hunt is a 72 y.o. female with a history of HTN, HLD, CAD s/p STEMI 2019, atrial flutter, atrial fibrillation who presents for follow up in the [facility].  The patient was initially diagnosed with typical atrial flutter and underwent CTI ablation on 12/04/22. However, atypical atrial flutter and afib were induced during the procedure. Decision was made to manage medically and she was started on amiodarone. Patient is on Eliquis for a CHADS2VASC score of 4.  On follow up today, she remains in atypical flutter with slow V rate. She remains very active by walking daily. She can feel a "fluttering" sensation when she lays on her left side. No bleeding issues on anticoagulation. She has an occasional, brief discomfort in her chest since the ablation, not associated with activity. She denies any groin issues.   Today, she denies symptoms of shortness of breath, orthopnea, PND, lower extremity edema, dizziness, presyncope, syncope, snoring, daytime somnolence, bleeding, or neurologic sequela. The patient is tolerating medications without difficulties and is otherwise without complaint today.    Atrial Fibrillation Risk Factors:  she does not have symptoms or diagnosis of sleep apnea. she does not have a history of rheumatic fever.   Atrial Fibrillation Management history:  Previous antiarrhythmic drugs: amiodarone  Previous cardioversions: none Previous ablations: 12/04/22 CTI Anticoagulation history: Eliquis  ROS- All systems are reviewed and negative except as per the HPI above.  Past Medical History:  Diagnosis Date   CAD (coronary artery disease) 01/07/2019   Coronary artery disease    HTN (hypertension) 01/07/2019   Hypertension    Medical history non-contributory    Myocardial  infarction [facility])    STEMI involving left anterior descending coronary artery (HCC) 12/08/2017   Balloon PTCA of the Distal LAD    Current Outpatient Medications  Medication Sig Dispense Refill   amiodarone (PACERONE) 200 MG tablet Take 1 tablet (200 mg total) by mouth daily. START by taking amiodarone 200mg  (1 tablet) twice daily for 5 days THEN reduce to 200mg  once daily 35 tablet 5   apixaban (ELIQUIS) 5 MG TABS tablet Take 1 tablet (5 mg total) by mouth 2 (two) times daily. 180 tablet 3   Calcium Carb-Cholecalciferol (CALCIUM 500 + D3 PO) Take 1 tablet by mouth daily.     ibandronate (BONIVA) 150 MG tablet Take 150 mg by mouth every 30 (thirty) days. Take in the morning with a full glass of water, on an empty stomach, and do not take anything else by mouth or lie down for the next 30 min. Takes the first week of the month.     losartan (COZAAR) 25 MG tablet TAKE 1 TABLET EVERY EVENING 90 tablet 3   metoprolol succinate (TOPROL XL) 25 MG 24 hr tablet Take 1 tablet (25 mg total) by mouth daily. 30 tablet 11   Multiple Vitamin (MULTIVITAMIN WITH MINERALS) TABS tablet Take 1 tablet by mouth daily.     nitroGLYCERIN (NITROSTAT) 0.4 MG SL tablet Place 0.4 mg under the tongue every 5 (five) minutes as needed for chest pain.     Omega-3 Fatty Acids (FISH OIL ADULT GUMMIES PO) Take 2 each by mouth daily.     omeprazole (PRILOSEC) 20 MG capsule Take 20 mg by mouth daily as needed (heartburn/indigestion).     Polyvinyl Alcohol-Povidone PF 1.4-0.6 % SOLN Place 1 drop  into both eyes daily as needed (dry/irritated eyes.).     rosuvastatin (CRESTOR) 20 MG tablet Take 1 tablet (20 mg total) by mouth daily. 10 tablet 0   No current facility-administered medications for this encounter.    Physical Exam: BP 132/76   Pulse (!) 48   Ht 5\' 3"  (1.6 m)   Wt 57.5 kg   BMI 22.46 kg/m   GEN: Well nourished, well developed in no acute distress NECK: No JVD; No carotid bruits CARDIAC: Irregularly irregular  rate and rhythm, no murmurs, rubs, gallops RESPIRATORY:  Clear to auscultation without rales, wheezing or rhonchi  ABDOMEN: Soft, non-tender, non-distended EXTREMITIES:  No edema; No deformity   Wt Readings from Last 3 Encounters:  12/25/22 57.5 kg  12/04/22 56.7 kg  09/21/22 55.8 kg     EKG today demonstrates  Atypical atrial flutter with variable block Vent. rate 48 BPM PR interval * ms QRS duration 70 ms QT/QTcB 458/409 ms   Echo 12/21/21 demonstrated  Echocardiogram 12/21/2021: Normal LV systolic function with visual EF 55-60%. Left ventricle cavity is normal in size. Normal left ventricular wall thickness. Normal globalwall motion. Unable to evaluate diastolic function due to atrial flutter. Normal LAP. Trace aortic regurgitation. Moderate (Grade II) mitral regurgitation. Mild tricuspid regurgitation. No evidence of pulmonary hypertension. Compared to 07/24/2019: MR remains stable, moderate LAE is now normal, otherwise no significant change.     CHA2DS2-VASc Score = 4  The patient's score is based upon: CHF History: 0 HTN History: 1 Diabetes History: 0 Stroke History: 0 Vascular Disease History: 1 Age Score: 1 Gender Score: 1       ASSESSMENT AND PLAN: Persistent Atrial Fibrillation/atrial flutter The patient's CHA2DS2-VASc score is 4, indicating a 4.8% annual risk of stroke.   S/p CTI ablation 12/04/22, also had inducible atypical flutter and afib. Started on amiodarone Patient remains in atypical atrial flutter today. We discussed rhythm vs rate control. We specifically discussed DCCV. She would like to take time to consider her options. Encouraged her to arrange DCCV with Dr Odis Hollingshead if she decides to proceed.  Continue amiodarone 200 mg daily Continue Eliquis 5 mg BID Continue Toprol 25 mg daily. She is asymptomatic with her bradycardia, may need to decrease BB dose once back in SR.   Secondary Hypercoagulable State (ICD10:  D68.69) The patient is at  significant risk for stroke/thromboembolism based upon her CHA2DS2-VASc Score of 4.  Continue Apixaban (Eliquis).   CAD S/p STEMI 2019 On statin No anginal symptoms  HTN Stable on current regimen    Follow up with Dr Lalla Brothers as scheduled.        Jorja Loa PA-C [facility] [address] [place], [place] [address] [phone]

## 2022-12-26 ENCOUNTER — Telehealth: Payer: Self-pay | Admitting: Cardiology

## 2022-12-26 ENCOUNTER — Ambulatory Visit
Admission: RE | Admit: 2022-12-26 | Discharge: 2022-12-26 | Disposition: A | Discharge status: Home / Self Care | Payer: Medicare PPO | Source: Ambulatory Visit | Attending: Family Medicine | Admitting: Family Medicine

## 2022-12-26 DIAGNOSIS — Z1231 Encounter for screening mammogram for malignant neoplasm of breast: Secondary | ICD-10-CM | POA: Diagnosis not present

## 2022-12-26 NOTE — Telephone Encounter (Signed)
Left message for patient to call back  

## 2022-12-26 NOTE — Telephone Encounter (Signed)
Pt came by to get some documentation that her afibulation was done, all I could print was the letter with the date of procedure and instructions. Please contact pt for further assistance. Need pw to send to a different insurance company

## 2022-12-28 ENCOUNTER — Telehealth: Payer: Self-pay | Admitting: Cardiology

## 2022-12-28 ENCOUNTER — Other Ambulatory Visit (HOSPITAL_COMMUNITY): Payer: Self-pay | Admitting: *Deleted

## 2022-12-28 ENCOUNTER — Ambulatory Visit: Payer: Medicare PPO | Admitting: Cardiology

## 2022-12-28 ENCOUNTER — Encounter (HOSPITAL_COMMUNITY): Payer: Self-pay | Admitting: *Deleted

## 2022-12-28 DIAGNOSIS — I484 Atypical atrial flutter: Secondary | ICD-10-CM

## 2022-12-28 NOTE — Telephone Encounter (Signed)
Called pt to address concern.  Reports had an OV with Fenton, PA on 9/10.  Was told there was 2 things she could do.  Pt is wanting to set up an appointment to have procedure done. Asked pt if speaking of DCCV reports does not know.  Advised will send to provider to advise.

## 2022-12-28 NOTE — Telephone Encounter (Signed)
Patient calling in with questions about an procedure. Please advise

## 2022-12-28 NOTE — Telephone Encounter (Signed)
Patient ready to proceed with cardioversion as discussed at recent office visit with Jorja Loa PA. Pt asked  cardioversion be scheduled with another provider as Dr Valentino Saxon office is transitioning so their office is closed currently. Scheduled for 9/26. Instructed to reduce metoprolol to 12.5mg  day of cardioversion. Pt in agreement. Letter sent with instructions.

## 2023-01-03 ENCOUNTER — Other Ambulatory Visit: Payer: Self-pay

## 2023-01-03 MED ORDER — AMIODARONE HCL 200 MG PO TABS: 200.0000 mg | ORAL_TABLET | Freq: Every day | ORAL | 2 refills | Status: DC

## 2023-01-03 MED ORDER — METOPROLOL SUCCINATE ER 25 MG PO TB24: 25.0000 mg | ORAL_TABLET | Freq: Every day | ORAL | 2 refills | Status: DC

## 2023-01-03 NOTE — Telephone Encounter (Signed)
The patient called and wanted to know if she was supposed to continue the amiodarone 200mg  daily and the metoprolol succinate 25 mg daily.  The patient was told yes, that she still has refills on the medications.  The patient stated that she still had metoprolol 50 mg that she used to take and the patient was told to continue with the current dose that she was prescribed which is the 25 mg daily.  The patient requested her refills to be sent to [facility].  The medication refills were sent.

## 2023-01-09 ENCOUNTER — Encounter (HOSPITAL_COMMUNITY): Payer: Self-pay | Admitting: Anesthesiology

## 2023-01-09 NOTE — Progress Notes (Signed)
Unable to reach patient about procedure, but was able to leave a detailed message. Stated that the patient needed to arrive at the hospital at 1215 , remain NPO after 0000, needs to have a ride home and a responsible adult to stay with them for 24 hours after the procedure. Instructed the patient to call back if they had any questions.

## 2023-01-09 NOTE — Anesthesia Preprocedure Evaluation (Signed)
Anesthesia Evaluation    Reviewed: Allergy & Precautions, Patient's Chart, lab work & pertinent test results  History of Anesthesia Complications Negative for: history of anesthetic complications  Airway        Dental   Pulmonary neg pulmonary ROS          Cardiovascular hypertension, Pt. on home beta blockers and Pt. on medications + CAD and + Past MI  + dysrhythmias Atrial Fibrillation + Valvular Problems/Murmurs MR    '23 TTE - EF 55-60%. Trace aortic regurgitation. Moderate (Grade II) mitral regurgitation. Mild tricuspid regurgitation.     Neuro/Psych negative neurological ROS  negative psych ROS   GI/Hepatic Neg liver ROS,GERD  Medicated,,  Endo/Other  negative endocrine ROS    Renal/GU negative Renal ROS     Musculoskeletal negative musculoskeletal ROS (+)    Abdominal   Peds  Hematology  On eliquis    Anesthesia Other Findings   Reproductive/Obstetrics                             Anesthesia Physical Anesthesia Plan  ASA: 3  Anesthesia Plan: General   Post-op Pain Management: Minimal or no pain anticipated   Induction: Intravenous  PONV Risk Score and Plan: 3 and Treatment may vary due to age or medical condition and Propofol infusion  Airway Management Planned: Natural Airway and Mask  Additional Equipment: None  Intra-op Plan:   Post-operative Plan:   Informed Consent:   Plan Discussed with: CRNA and Anesthesiologist  Anesthesia Plan Comments:        Anesthesia Quick Evaluation

## 2023-01-10 ENCOUNTER — Encounter (HOSPITAL_COMMUNITY)
Admission: RE | Disposition: A | Discharge status: Home / Self Care | Payer: Self-pay | Source: Ambulatory Visit | Attending: Internal Medicine

## 2023-01-10 ENCOUNTER — Encounter (HOSPITAL_COMMUNITY): Payer: Self-pay

## 2023-01-10 ENCOUNTER — Other Ambulatory Visit (HOSPITAL_COMMUNITY): Payer: Medicare PPO | Admitting: Physician Assistant

## 2023-01-10 ENCOUNTER — Ambulatory Visit (HOSPITAL_COMMUNITY)
Admission: RE | Admit: 2023-01-10 | Discharge: 2023-01-10 | Disposition: A | Discharge status: Home / Self Care | Payer: Medicare PPO | Source: Ambulatory Visit | Attending: Internal Medicine | Admitting: Internal Medicine

## 2023-01-10 DIAGNOSIS — Z79899 Other long term (current) drug therapy: Secondary | ICD-10-CM | POA: Diagnosis not present

## 2023-01-10 DIAGNOSIS — I2510 Atherosclerotic heart disease of native coronary artery without angina pectoris: Secondary | ICD-10-CM | POA: Diagnosis not present

## 2023-01-10 DIAGNOSIS — I480 Paroxysmal atrial fibrillation: Secondary | ICD-10-CM

## 2023-01-10 DIAGNOSIS — Z539 Procedure and treatment not carried out, unspecified reason: Secondary | ICD-10-CM | POA: Insufficient documentation

## 2023-01-10 DIAGNOSIS — I484 Atypical atrial flutter: Secondary | ICD-10-CM | POA: Insufficient documentation

## 2023-01-10 DIAGNOSIS — I252 Old myocardial infarction: Secondary | ICD-10-CM | POA: Insufficient documentation

## 2023-01-10 DIAGNOSIS — I4891 Unspecified atrial fibrillation: Secondary | ICD-10-CM

## 2023-01-10 DIAGNOSIS — D6869 Other thrombophilia: Secondary | ICD-10-CM | POA: Diagnosis not present

## 2023-01-10 DIAGNOSIS — I10 Essential (primary) hypertension: Secondary | ICD-10-CM | POA: Insufficient documentation

## 2023-01-10 DIAGNOSIS — Z7901 Long term (current) use of anticoagulants: Secondary | ICD-10-CM | POA: Diagnosis not present

## 2023-01-10 DIAGNOSIS — I4819 Other persistent atrial fibrillation: Secondary | ICD-10-CM | POA: Insufficient documentation

## 2023-01-10 SURGERY — INVASIVE LAB ABORTED CASE
Anesthesia: General

## 2023-01-10 NOTE — Interval H&P Note (Signed)
History and Physical Interval Note:  01/10/2023 10:01 AM  Suzanne Hunt  has presented today for surgery, with the diagnosis of AFIB.  The various methods of treatment have been discussed with the patient and family. After consideration of risks, benefits and other options for treatment, the patient has consented to  Procedure(s): CARDIOVERSION (N/A) as a surgical intervention.  The patient's history has been reviewed, patient examined, no change in status, stable for surgery.  I have reviewed the patient's chart and labs.  Questions were answered to the patient's satisfaction.     Chrystie Nose

## 2023-01-10 NOTE — Progress Notes (Signed)
   Patient presented for DCCV today, however, she was noted to be in sinus bradycardia. Cardioversion was cancelled - asked to follow-up in the afib clinic.  Chrystie Nose, MD, [facility], FACP  [place]  [facility]  Medical Director of the Advanced Lipid Disorders &  Cardiovascular Risk Reduction Clinic Diplomate of the American Board of Clinical Lipidology Attending Cardiologist  Direct Dial: [phone]  Fax: [phone]  Website:  [email or web address]

## 2023-01-17 NOTE — Progress Notes (Signed)
Electrophysiology Office Follow up Visit Note:    Date:  01/18/2023   ID:  Suzanne Hunt, DOB [birth year], MRN [id number]  PCP:  [name], MD  [facility] Cardiologist:  [name], DO  [facility] Electrophysiologist:  Lanier Prude, MD    Interval History:    Suzanne Hunt is a 72 y.o. female who presents for a follow up visit.   She had an ablation on December 04, 2022 for atrial flutter.  During the ablation, the CTI was ablated with bidirectional block achieved.  During the procedure multiple atypical atrial flutter circuits in atrial fibrillation were observed.  The plan was to manage these medically given significant scarring in the left atrium.  Amiodarone was started with plans for early follow-up in the outpatient setting.  She has been doing okay since I saw her.  She has not appreciated any fast heartbeats or palpitations.  She did have an episode of chest discomfort while walking on a treadmill.  The pain was centrally located.  It felt similar to an episode that ultimately led to prior heart cath and stent placement.  She took a nitroglycerin while on the treadmill and the pain subsided after couple of minutes.  She is concerned that she may have a heart attack.  She continues to take Eliquis and amiodarone.  She is tolerating these medications well.      Past medical, surgical, social and family history were reviewed.  ROS:   Please see the history of present illness.    All other systems reviewed and are negative.  EKGs/Labs/Other Studies Reviewed:    The following studies were reviewed today:  January 10, 2023 EKG shows sinus bradycardia with a ventricular rate of 47 bpm  EKG Interpretation Date/Time:  Friday January 18 2023 12:07:34 EDT Ventricular Rate:  57 PR Interval:    QRS Duration:  68 QT Interval:  526 QTC Calculation: 511 R Axis:   1  Text Interpretation: Atrial flutter with variable A-V block Confirmed by Steffanie Dunn [phone]) on 01/18/2023 12:13:44 PM    Physical Exam:    VS:  BP (!) 142/66   Pulse (!) 52   Ht 5\' 3"  (1.6 m)   Wt 124 lb 9.6 oz (56.5 kg)   SpO2 96%   BMI 22.07 kg/m     Wt Readings from Last 3 Encounters:  01/18/23 124 lb 9.6 oz (56.5 kg)  01/10/23 123 lb (55.8 kg)  12/25/22 126 lb 12.8 oz (57.5 kg)     GEN:  Well nourished, well developed in no acute distress CARDIAC: RRR, no murmurs, rubs, gallops RESPIRATORY:  Clear to auscultation without rales, wheezing or rhonchi       ASSESSMENT:    1. Atypical atrial flutter (HCC)   2. Encounter for long-term (current) use of high-risk medication   3. Primary hypertension   4. Atrial fibrillation, unspecified type (HCC)    PLAN:    In order of problems listed above:  #Chest pain Concerning for typical angina given occurred during exertion and response to nitroglycerin. I would like to get a PET stress test.  She should follow-up with Dr. Odis Hollingshead after the stress test results.   #Atrial fibrillation #Atrial flutter #High risk med monitoring-amiodarone Back in atrial flutter today.  Likely atypical based on EKG appearance.   If she remains in atrial flutter with more amiodarone at the appointment with Dr. Odis Hollingshead, can consider cardioversion.  I do not want to proceed immediately to cardioversion in  case her stress test is abnormal and heart cath is needed. Continue Eliquis for stroke prophylaxis  Check CMP,TSH and free T4 today.   #Hypertension At goal today.  Recommend checking blood pressures 1-2 times per week at home and recording the values.  Recommend bringing these recordings to the primary care physician.  Follow-up with Dr. Odis Hollingshead in 4 weeks Follow-up with APP in 3 months    Signed, Steffanie Dunn, MD, [facility], [facility] 01/18/2023 12:36 PM    Electrophysiology [facility]

## 2023-01-18 ENCOUNTER — Other Ambulatory Visit: Payer: Self-pay | Admitting: Cardiology

## 2023-01-18 ENCOUNTER — Encounter: Payer: Self-pay | Admitting: Cardiology

## 2023-01-18 ENCOUNTER — Ambulatory Visit: Payer: Medicare PPO | Attending: Cardiology | Admitting: Cardiology

## 2023-01-18 VITALS — BP 142/66 | HR 52 | Ht 63.0 in | Wt 124.6 lb

## 2023-01-18 DIAGNOSIS — Z79899 Other long term (current) drug therapy: Secondary | ICD-10-CM

## 2023-01-18 DIAGNOSIS — I484 Atypical atrial flutter: Secondary | ICD-10-CM

## 2023-01-18 DIAGNOSIS — I10 Essential (primary) hypertension: Secondary | ICD-10-CM

## 2023-01-18 DIAGNOSIS — I4891 Unspecified atrial fibrillation: Secondary | ICD-10-CM

## 2023-01-18 NOTE — Patient Instructions (Signed)
Medication Instructions:  Your physician recommends that you continue on your current medications as directed. Please refer to the Current Medication list given to you today.  *If you need a refill on your cardiac medications before your next appointment, please call your pharmacy*  Lab Work: TODAY: CMET, TSH, T4  Testing/Procedures: Your provider recommends that you have a cardiac PET stress test. See instruction letter for further information  Follow-Up: At [facility], you and your health needs are our priority.  As part of our continuing mission to provide you with exceptional heart care, we have created designated Provider Care Teams.  These Care Teams include your primary Cardiologist (physician) and Advanced Practice Providers (APPs -  Physician Assistants and Nurse Practitioners) who all work together to provide you with the care you need, when you need it.  Your next appointment:   4 weeks  Provider:   [name], DO     ns

## 2023-01-19 LAB — COMPREHENSIVE METABOLIC PANEL
ALT: 62 [IU]/L — ABNORMAL HIGH (ref 0–32)
AST: 56 [IU]/L — ABNORMAL HIGH (ref 0–40)
Albumin: 3.8 g/dL (ref 3.8–4.8)
Alkaline Phosphatase: 276 [IU]/L — ABNORMAL HIGH (ref 44–121)
BUN/Creatinine Ratio: 16 (ref 12–28)
BUN: 21 mg/dL (ref 8–27)
Bilirubin Total: 0.6 mg/dL (ref 0.0–1.2)
CO2: 23 mmol/L (ref 20–29)
Calcium: 9.2 mg/dL (ref 8.7–10.3)
Chloride: 104 mmol/L (ref 96–106)
Creatinine, Ser: 1.31 mg/dL — ABNORMAL HIGH (ref 0.57–1.00)
Globulin, Total: 2.9 g/dL (ref 1.5–4.5)
Glucose: 70 mg/dL (ref 70–99)
Potassium: 5.3 mmol/L — ABNORMAL HIGH (ref 3.5–5.2)
Sodium: 141 mmol/L (ref 134–144)
Total Protein: 6.7 g/dL (ref 6.0–8.5)
eGFR: 44 mL/min/{1.73_m2} — ABNORMAL LOW (ref >59)

## 2023-01-19 LAB — TSH: TSH: 1.69 u[IU]/mL (ref 0.450–4.500)

## 2023-01-19 LAB — T4, FREE: Free T4: 1.59 ng/dL (ref 0.82–1.77)

## 2023-01-24 ENCOUNTER — Ambulatory Visit: Payer: Self-pay | Admitting: Cardiology

## 2023-01-25 ENCOUNTER — Telehealth: Payer: Self-pay | Admitting: Cardiology

## 2023-01-25 NOTE — Telephone Encounter (Signed)
Patient stated she needs to get a copy of the procedure she had done on 01/10/23 sent to her as she will need send information to her insurance company.  Patient wants a call back to discuss questions about information posted to her MyChart.

## 2023-01-25 NOTE — Telephone Encounter (Signed)
Left message for patient to call back  

## 2023-01-25 NOTE — Telephone Encounter (Signed)
Patient states that she needs records from her cardioversion to send over to her insurance company. She states that she received a message from medical records but there was nothing attached when she opened it. I advised her that I would send them a message and let them know and they will get the information that she needs to her.

## 2023-02-04 ENCOUNTER — Encounter (HOSPITAL_COMMUNITY): Payer: Self-pay

## 2023-02-04 ENCOUNTER — Other Ambulatory Visit: Payer: Self-pay

## 2023-02-04 DIAGNOSIS — I25118 Atherosclerotic heart disease of native coronary artery with other forms of angina pectoris: Secondary | ICD-10-CM

## 2023-02-05 ENCOUNTER — Telehealth (HOSPITAL_COMMUNITY): Payer: Self-pay | Admitting: *Deleted

## 2023-02-05 NOTE — Telephone Encounter (Signed)
Attempted to call patient regarding upcoming cardiac PET appointment. Left message on voicemail with name and callback number  Suzanne Brick RN Navigator Cardiac Imaging [facility] [phone] Office [phone] Cell  Reminder to avoid caffeine 12 hours prior to her cardiac PET study.

## 2023-02-05 NOTE — Telephone Encounter (Addendum)
Received call from patient regarding upcoming cardiac imaging study; pt verbalizes understanding of appt date/time, parking situation and where to check in, pre-test NPO status and medications ordered, and verified current allergies; name and call back number provided for further questions should they arise Johney Frame RN Navigator Cardiac Imaging [facility] [phone] office [phone] cell  Patient aware to avoid caffeine for 12 hours prior to PET.

## 2023-02-06 ENCOUNTER — Encounter (HOSPITAL_COMMUNITY)
Admission: RE | Admit: 2023-02-06 | Discharge: 2023-02-06 | Disposition: A | Discharge status: Home / Self Care | Payer: Medicare PPO | Source: Ambulatory Visit | Attending: Cardiology | Admitting: Cardiology

## 2023-02-06 DIAGNOSIS — I10 Essential (primary) hypertension: Secondary | ICD-10-CM | POA: Diagnosis not present

## 2023-02-06 DIAGNOSIS — Z79899 Other long term (current) drug therapy: Secondary | ICD-10-CM | POA: Diagnosis not present

## 2023-02-06 DIAGNOSIS — I4891 Unspecified atrial fibrillation: Secondary | ICD-10-CM | POA: Diagnosis not present

## 2023-02-06 DIAGNOSIS — I484 Atypical atrial flutter: Secondary | ICD-10-CM | POA: Diagnosis not present

## 2023-02-06 LAB — NM PET CT CARDIAC PERFUSION MULTI W/ABSOLUTE BLOODFLOW
LV dias vol: 69 mL (ref 46–106)
LV sys vol: 26 mL
MBFR: 2.75
Nuc Rest EF: 62 %
Nuc Stress EF: 70 %
Rest MBF: 0.64 ml/g/min
Rest Nuclear Isotope Dose: 14.6 mCi
ST Depression (mm): 0 mm
Stress MBF: 1.76 ml/g/min
Stress Nuclear Isotope Dose: 14.7 mCi

## 2023-02-06 MED ORDER — RUBIDIUM RB82 GENERATOR (RUBYFILL)
14.6000 | PACK | Freq: Once | INTRAVENOUS | Status: AC
  Administered 2023-02-06: 14.7 via INTRAVENOUS

## 2023-02-06 MED ORDER — REGADENOSON 0.4 MG/5ML IV SOLN
INTRAVENOUS | Status: AC
  Filled 2023-02-06: qty 5

## 2023-02-06 MED ORDER — RUBIDIUM RB82 GENERATOR (RUBYFILL)
14.7000 | PACK | Freq: Once | INTRAVENOUS | Status: AC
  Administered 2023-02-06: 14.7 via INTRAVENOUS

## 2023-02-06 MED ORDER — REGADENOSON 0.4 MG/5ML IV SOLN
0.4000 mg | Freq: Once | INTRAVENOUS | Status: AC
  Administered 2023-02-06: 0.4 mg via INTRAVENOUS

## 2023-02-06 NOTE — Progress Notes (Signed)
Patient presents for a cardiac PET stress test and tolerated procedure without incident. Patient maintained acceptable vital signs throughout the test and was offered caffeine after test.  Patient ambulated out of department with a steady gait.  

## 2023-02-20 ENCOUNTER — Encounter: Payer: Self-pay | Admitting: Cardiology

## 2023-02-20 ENCOUNTER — Ambulatory Visit: Payer: Medicare PPO | Attending: Cardiology | Admitting: Cardiology

## 2023-02-20 VITALS — BP 136/72 | HR 39 | Resp 16 | Ht 63.0 in | Wt 125.2 lb

## 2023-02-20 DIAGNOSIS — I4892 Unspecified atrial flutter: Secondary | ICD-10-CM | POA: Diagnosis not present

## 2023-02-20 DIAGNOSIS — I10 Essential (primary) hypertension: Secondary | ICD-10-CM

## 2023-02-20 DIAGNOSIS — E782 Mixed hyperlipidemia: Secondary | ICD-10-CM

## 2023-02-20 DIAGNOSIS — Z955 Presence of coronary angioplasty implant and graft: Secondary | ICD-10-CM | POA: Diagnosis not present

## 2023-02-20 DIAGNOSIS — I252 Old myocardial infarction: Secondary | ICD-10-CM

## 2023-02-20 DIAGNOSIS — I2510 Atherosclerotic heart disease of native coronary artery without angina pectoris: Secondary | ICD-10-CM

## 2023-02-20 DIAGNOSIS — Z8679 Personal history of other diseases of the circulatory system: Secondary | ICD-10-CM

## 2023-02-20 DIAGNOSIS — I480 Paroxysmal atrial fibrillation: Secondary | ICD-10-CM | POA: Diagnosis not present

## 2023-02-20 DIAGNOSIS — Z7901 Long term (current) use of anticoagulants: Secondary | ICD-10-CM

## 2023-02-20 DIAGNOSIS — Z79899 Other long term (current) drug therapy: Secondary | ICD-10-CM

## 2023-02-20 DIAGNOSIS — Z9889 Other specified postprocedural states: Secondary | ICD-10-CM

## 2023-02-20 NOTE — Progress Notes (Signed)
Cardiology Office Note:  .   Date:  02/20/2023  ID:  [name], DOB [birth year], MRN [id number] PCP:  [name], MD  Former Cardiology Providers: Dr. Yates Decamp and Altamese Nahunta, APRN, FNP-C  [facility] Providers Cardiologist:  [name], DO Electrophysiologist:  Lanier Prude, MD , [facility] (established care 07/16/2019) Electrophysiologist:  Lanier Prude, MD  Click to update primary MD,subspecialty MD or APP then REFRESH:1}    Chief Complaint  Patient presents with   Coronary artery disease involving native coronary artery of   Atrial Flutter   Follow-up    History of Present Illness: .   Suzanne Hunt is a 72 y.o. African-American female whose past medical history and cardiovascular risk factors includes: Severe coronary artery calcification, aortic atherosclerosis, hypertension, hyperlipidemia, history of anterior STEMI status post PCI to the mid/distal LAD with balloon angioplasty, history of atrial fibrillation, paroxysmal atrial flutter, status post ablation, postmenopausal female, advanced age.   Patient had anterior STEMI back in August 2019 underwent angioplasty/PCI to the mid/distal LAD.  Since then she has been doing well from a cardiovascular standpoint and focusing on secondary prevention.  She also has a history of paroxysmal atrial flutter/coarse atrial fibrillation.  In the past she wanted to pursue only rate control strategy but given how active she is for her age and overall good candidate for possible ablation she was referred to EP for further guidance.  Since last office visit she did undergo ablation for atrial flutter and was started on amiodarone.  Overall doing well and endorses less palpitations since the procedure.  During her last visit with EP she had voiced chest pain underwent cardiac PET/CT which was reported to be a low risk study.  Review of Systems: .   Review of Systems  Cardiovascular:  Negative for chest  pain, claudication, irregular heartbeat, leg swelling, near-syncope, orthopnea, palpitations, paroxysmal nocturnal dyspnea and syncope.  Respiratory:  Negative for shortness of breath.   Hematologic/Lymphatic: Negative for bleeding problem.    Studies Reviewed:   EKG: EKG Interpretation Date/Time:  Wednesday February 20 2023 14:53:47 EST Ventricular Rate:  39 PR Interval:  176 QRS Duration:  72 QT Interval:  582 QTC Calculation: 468 R Axis:   27  Text Interpretation: Marked sinus bradycardia Nonspecific T wave abnormality When compared with ECG of 18-Jan-2023 12:07, Sinus rhythm has replaced Atrial flutter Nonspecific T wave abnormality no longer evident in Inferior leads Confirmed by [name] [phone]) on 02/20/2023 3:16:20 PM  Echocardiogram: 12/21/2021:  Normal LV systolic function with visual EF 55-60%. Left ventricle cavity is normal in size. Normal left ventricular wall thickness. Normal global wall motion. Unable to evaluate diastolic function due to atrial flutter. Normal LAP.  Trace aortic regurgitation.  Moderate (Grade II) mitral regurgitation.  Mild tricuspid regurgitation. No evidence of pulmonary hypertension.  Compared to 07/24/2019: MR remains stable, moderate LAE is now normal, otherwise no significant change.  Cardiac PET/CT 02/06/2023   LV perfusion is normal. There is no evidence of ischemia. There is no evidence of infarction.   Rest left ventricular function is normal. Rest EF: 62%. Stress left ventricular function is normal. Stress EF: 70%. End diastolic cavity size is normal. End systolic cavity size is normal.   Myocardial blood flow was computed to be 0.55ml/g/min at rest and 1.91ml/g/min at stress. Global myocardial blood flow reserve was 2.75 and was normal.   Severe coronary calcifications were present. Coronary calcifications were present in the left anterior descending artery distribution(s).patient  has had PCI of LAD but no mention of stent so calcium  reported   The study is normal. The study is low risk.   Underlying rhythm atrial flutter  Left Heart Catheterization 11/08/20:  LV: LV pressure: 107/0, EDP 5 mmHg.  Aortic pressure 100/49, mean 69 mmHg.  There is no pressure gradient across the aortic valve.  Low normal LV systolic function 50% with mid to distal anterolateral hypokinesis. No mitral regurgitation.   Left main: Long vessel, mild calcification is noted. LAD: Large vessel, mild diffuse coronary calcification is evident.  There is minimal disease in the midsegment at most 20 to 30%.  Previously performed balloon angioplasty site on 12/08/2017 the mid LAD and apical LAD is widely patent. CX: Moderate caliber vessel, smooth and normal.  Mild coronary calcification is evident. RCA: Mid segment has a 30% stenosis.  Otherwise vessel is smooth.  There is mild calcification noted in the proximal RCA.   Recommendation: Patient has very mild disease and previously performed angioplasty site at the mid LAD and distal LAD on 12/08/2017 is widely patent.  Continue anticoagulation for paroxysmal atrial fibrillation, patient was in coarse atrial fibrillation today during heart catheterization.  30 mill contrast utilized.  RADIOLOGY: NA  Risk Assessment/Calculations:   Click Here to Calculate/Change CHADS2VASc Score The patient's CHADS2-VASc score is 4, indicating a 4.8% annual risk of stroke.   CHF History: No HTN History: Yes Diabetes History: No Stroke History: No Vascular Disease History: Yes  Labs:       Latest Ref Rng & Units 11/20/2022    8:28 AM 07/12/2022    8:59 AM 11/03/2020    8:09 AM  CBC  WBC 3.4 - 10.8 x10E3/uL 3.1   5.5   Hemoglobin 11.1 - 15.9 g/dL 47.8  29.5  62.1   Hematocrit 34.0 - 46.6 % 35.5  36.9  36.9   Platelets 150 - 450 x10E3/uL 142   209        Latest Ref Rng & Units 01/18/2023    1:21 PM 11/20/2022    8:28 AM 07/12/2022    8:59 AM  BMP  Glucose 70 - 99 mg/dL 70  78  88   BUN 8 - 27 mg/dL 21  24  23     Creatinine 0.57 - 1.00 mg/dL 3.08  6.57  8.46   BUN/Creat Ratio 12 - 28 16  18  23    Sodium 134 - 144 mmol/L 141  141  140   Potassium 3.5 - 5.2 mmol/L 5.3  4.2  4.4   Chloride 96 - 106 mmol/L 104  103  106   CO2 20 - 29 mmol/L 23  25  23    Calcium 8.7 - 10.3 mg/dL 9.2  8.9  8.9       Latest Ref Rng & Units 01/18/2023    1:21 PM 11/20/2022    8:28 AM 07/12/2022    8:59 AM  CMP  Glucose 70 - 99 mg/dL 70  78  88   BUN 8 - 27 mg/dL 21  24  23    Creatinine 0.57 - 1.00 mg/dL 9.62  9.52  8.41   Sodium 134 - 144 mmol/L 141  141  140   Potassium 3.5 - 5.2 mmol/L 5.3  4.2  4.4   Chloride 96 - 106 mmol/L 104  103  106   CO2 20 - 29 mmol/L 23  25  23    Calcium 8.7 - 10.3 mg/dL 9.2  8.9  8.9   Total  Protein 6.0 - 8.5 g/dL 6.7   6.0   Total Bilirubin 0.0 - 1.2 mg/dL 0.6   0.5   Alkaline Phos 44 - 121 IU/L 276   152   AST 0 - 40 IU/L 56   25   ALT 0 - 32 IU/L 62   37     Lab Results  Component Value Date   CHOL 129 07/12/2022   HDL 47 07/12/2022   LDLCALC 73 07/12/2022   LDLDIRECT 81 07/12/2022   TRIG 35 07/12/2022   CHOLHDL 3.1 12/09/2017   No results for input(s): "LIPOA" in the last 8760 hours. No components found for: "NTPROBNP" No results for input(s): "PROBNP" in the last 8760 hours. Recent Labs    01/18/23 1321  TSH 1.690     Physical Exam:    Today's Vitals   02/20/23 1450  BP: 136/72  Pulse: (!) 39  Resp: 16  SpO2: 97%  Weight: 125 lb 3.2 oz (56.8 kg)  Height: 5\' 3"  (1.6 m)   Body mass index is 22.18 kg/m. Wt Readings from Last 3 Encounters:  02/20/23 125 lb 3.2 oz (56.8 kg)  01/18/23 124 lb 9.6 oz (56.5 kg)  01/10/23 123 lb (55.8 kg)    Physical Exam  Constitutional: No distress.  Age appropriate, hemodynamically stable.   Neck: No JVD present.  Cardiovascular: Regular rhythm, S1 normal, S2 normal, intact distal pulses and normal pulses. Bradycardia present. Exam reveals no gallop, no S3 and no S4.  No murmur heard. Pulmonary/Chest: Effort normal and  breath sounds normal. No stridor. She has no wheezes. She has no rales.  Abdominal: Soft. Bowel sounds are normal. She exhibits no distension. There is no abdominal tenderness.  Musculoskeletal:        General: No edema.     Cervical back: Neck supple.  Neurological: She is alert and oriented to person, place, and time. She has intact cranial nerves (2-12).  Skin: Skin is warm and moist.   Impression & Recommendation(s):  Impression:   ICD-10-CM   1. Paroxysmal atrial flutter (HCC)  I48.92 EKG 12-Lead    2. Paroxysmal atrial fibrillation (HCC)  I48.0     3. S/P ablation of atrial flutter  Z98.890    Z86.79     4. Long term (current) use of anticoagulants  Z79.01 Hemoglobin and hematocrit, blood    Basic metabolic panel    Basic metabolic panel    Hemoglobin and hematocrit, blood    5. Long term current use of antiarrhythmic drug  Z79.899     6. Coronary artery disease involving native coronary artery of native heart without angina pectoris  I25.10     7. History of coronary angioplasty with insertion of stent  Z95.5     8. Hx of ST elevation myocardial infarction  I25.2     9. Essential hypertension  I10     10. Mixed hyperlipidemia  E78.2        Recommendation(s):  Paroxysmal atrial flutter/fibrillation (HCC) S/P ablation of atrial flutter Underwent atrial flutter ablation in August 2024 with Dr. Lalla Brothers. Placed on antiarrhythmic medications to maintain sinus rhythm. EKG today illustrates marked sinus bradycardia -clinically asymptomatic Rate control: Toprol-XL 25 mg p.o. daily Rhythm control: Amiodarone 200 mg p.o. daily Thromboembolic prophylaxis: Eliquis. Discontinue Toprol-XL given the marked bradycardia. I have asked her to keep a log of her blood pressure and pulse at home, if the pulses are > 70 bpm reconsider adding Toprol-XL 12.5 mg p.o. daily  Long term (  current) use of anticoagulants and antiarrhythmic medications Patient does not endorse evidence of  bleeding. Hemoglobin remains relatively stable. Risks, benefits, alternatives to anticoagulation discussed. She has an appointment with Otilio Saber in January 2025 -will need to discuss the need for long-term amiodarone use.  If so amiodarone side effect profile will need to be monitored longitudinally.  Coronary artery disease involving native coronary artery of native heart without angina pectoris History of coronary angioplasty with insertion of stent Severe coronary artery calcification Hx of ST elevation myocardial infarction Denies anginal chest pain. Had cardiac PET/CT since last office visit which was reported to be low risk study. Reemphasized the importance of secondary prevention with focus on improving her modifiable cardiovascular risk factors such as glycemic control, lipid management, blood pressure control, weight loss. Given her prior STEMI, coronary interventions, and severe CAC recommended goal LDL less than 55 mg/dL. Patient remains reluctant to uptitrate lipid-lowering agents at this time and will discuss with PCP  Essential hypertension Office blood pressures are well-controlled.   Continue losartan 25 mg p.o. daily.  Mixed hyperlipidemia Currently on Crestor 20 mg p.o. daily.   She denies myalgia or other side effects. Most recent lipids dated March 2024, independently reviewed as noted above. LDL goal <55 mg/dL. Discussed increasing rosuvastatin to 40 mg p.o. nightly or continuing the same dose of rosuvastatin and adding Zetia 10 mg p.o. daily.  Patient does not want to uptitrate medical therapy despite my recommendations as well as PCPs. Monitor peripherally.  Orders Placed:  Orders Placed This Encounter  Procedures   Hemoglobin and hematocrit, blood    Standing Status:   Future    Number of Occurrences:   1    Standing Expiration Date:   02/20/2024   Basic metabolic panel    Standing Status:   Future    Number of Occurrences:   1    Standing Expiration  Date:   02/20/2024   EKG 12-Lead    As part of medical decision making reviewed the results of the cardiac PET/CT, atrial flutter ablation report, notes from Dr. Lalla Brothers postprocedure, EKG, labs during today's encounter  Final Medication List:   No orders of the defined types were placed in this encounter.   Medications Discontinued During This Encounter  Medication Reason   metoprolol succinate (TOPROL XL) 25 MG 24 hr tablet Discontinued by provider     Current Outpatient Medications:    amiodarone (PACERONE) 200 MG tablet, Take 1 tablet (200 mg total) by mouth daily. START by taking amiodarone 200mg  (1 tablet) twice daily for 5 days THEN reduce to 200mg  once daily, Disp: 90 tablet, Rfl: 2   apixaban (ELIQUIS) 5 MG TABS tablet, Take 1 tablet (5 mg total) by mouth 2 (two) times daily., Disp: 180 tablet, Rfl: 3   Calcium Carb-Cholecalciferol (CALCIUM 500 + D3 PO), Take 1 tablet by mouth daily., Disp: , Rfl:    ibandronate (BONIVA) 150 MG tablet, Take 150 mg by mouth every 30 (thirty) days. Take in the morning with a full glass of water, on an empty stomach, and do not take anything else by mouth or lie down for the next 30 min. Takes the first week of the month., Disp: , Rfl:    losartan (COZAAR) 25 MG tablet, TAKE 1 TABLET EVERY EVENING, Disp: 90 tablet, Rfl: 3   Multiple Vitamin (MULTIVITAMIN WITH MINERALS) TABS tablet, Take 1 tablet by mouth daily., Disp: , Rfl:    nitroGLYCERIN (NITROSTAT) 0.4 MG SL tablet, Place 0.4 mg  under the tongue every 5 (five) minutes as needed for chest pain., Disp: , Rfl:    Omega-3 Fatty Acids (FISH OIL ADULT GUMMIES PO), Take 2 each by mouth daily., Disp: , Rfl:    omeprazole (PRILOSEC) 20 MG capsule, Take 20 mg by mouth daily., Disp: , Rfl:    rosuvastatin (CRESTOR) 20 MG tablet, Take 1 tablet (20 mg total) by mouth daily., Disp: 10 tablet, Rfl: 0  Consent:   N/A  Disposition:   6 months sooner if needed Patient may be asked to follow-up sooner based on  the results of the above-mentioned testing.  Her questions and concerns were addressed to her satisfaction. She voices understanding of the recommendations provided during this encounter.    Signed, [name], DO, [facility]  [facility]  [address] [place], [place] [address] 02/20/2023 5:50 PM

## 2023-02-20 NOTE — Patient Instructions (Signed)
Medication Instructions:  Your physician has recommended you make the following change in your medication:   STOP Metoprolol Succinate (Toprol XL)   *If you need a refill on your cardiac medications before your next appointment, please call your pharmacy*  Lab Work: Complete a hemoglobin/ hematocrit and BMP blood draw 1 week prior to your 6 month follow-up appointment with Dr. Odis Hollingshead   If you have labs (blood work) drawn today and your tests are completely normal, you will receive your results only by: MyChart Message (if you have MyChart) OR A paper copy in the mail If you have any lab test that is abnormal or we need to change your treatment, we will call you to review the results.  Testing/Procedures: None ordered today.  Follow-Up: At [facility], you and your health needs are our priority.  As part of our continuing mission to provide you with exceptional heart care, we have created designated Provider Care Teams.  These Care Teams include your primary Cardiologist (physician) and Advanced Practice Providers (APPs -  Physician Assistants and Nurse Practitioners) who all work together to provide you with the care you need, when you need it.  Your next appointment:   6 month(s)  The format for your next appointment:   In Person  Provider:   [name], DO {

## 2023-02-24 ENCOUNTER — Other Ambulatory Visit: Payer: Self-pay | Admitting: Cardiology

## 2023-02-24 DIAGNOSIS — I4892 Unspecified atrial flutter: Secondary | ICD-10-CM

## 2023-02-25 NOTE — Telephone Encounter (Signed)
Prescription refill request for Eliquis received. Indication:afib Last office visit:11/24 Scr:1.31  10/24 Age: 72 Weight:56.8  kg  Prescription refilled

## 2023-03-12 DIAGNOSIS — Z7901 Long term (current) use of anticoagulants: Secondary | ICD-10-CM | POA: Diagnosis not present

## 2023-03-13 ENCOUNTER — Other Ambulatory Visit: Payer: Self-pay

## 2023-03-13 ENCOUNTER — Telehealth: Payer: Self-pay | Admitting: Cardiology

## 2023-03-13 DIAGNOSIS — Z7901 Long term (current) use of anticoagulants: Secondary | ICD-10-CM

## 2023-03-13 LAB — BASIC METABOLIC PANEL
BUN/Creatinine Ratio: 16 (ref 12–28)
BUN: 20 mg/dL (ref 8–27)
CO2: 23 mmol/L (ref 20–29)
Calcium: 9.1 mg/dL (ref 8.7–10.3)
Chloride: 107 mmol/L — ABNORMAL HIGH (ref 96–106)
Creatinine, Ser: 1.26 mg/dL — ABNORMAL HIGH (ref 0.57–1.00)
Glucose: 75 mg/dL (ref 70–99)
Potassium: 4.7 mmol/L (ref 3.5–5.2)
Sodium: 144 mmol/L (ref 134–144)
eGFR: 46 mL/min/{1.73_m2} — ABNORMAL LOW (ref >59)

## 2023-03-13 LAB — HEMOGLOBIN AND HEMATOCRIT, BLOOD
Hematocrit: 37.4 % (ref 34.0–46.6)
Hemoglobin: 11.9 g/dL (ref 11.1–15.9)

## 2023-03-13 NOTE — Telephone Encounter (Signed)
Patient returned staff call regarding results.

## 2023-03-13 NOTE — Progress Notes (Signed)
Orders for repeat BMP and H&H prior to 6 month f/u with Dr. Odis Hollingshead in May have been placed and released.

## 2023-03-22 NOTE — Telephone Encounter (Signed)
Spoke with pt over the phone. She stated she had already received the letter in the mail regarding her results. Pt verbalized understanding and had no further questions.

## 2023-04-15 ENCOUNTER — Other Ambulatory Visit: Payer: Self-pay | Admitting: Cardiology

## 2023-04-26 ENCOUNTER — Encounter: Payer: Self-pay | Admitting: Student

## 2023-04-26 ENCOUNTER — Ambulatory Visit: Payer: Medicare PPO | Attending: Student | Admitting: Student

## 2023-04-26 ENCOUNTER — Ambulatory Visit (INDEPENDENT_AMBULATORY_CARE_PROVIDER_SITE_OTHER): Payer: Medicare PPO

## 2023-04-26 VITALS — BP 118/60 | HR 53 | Ht 63.0 in | Wt 125.8 lb

## 2023-04-26 DIAGNOSIS — I2510 Atherosclerotic heart disease of native coronary artery without angina pectoris: Secondary | ICD-10-CM | POA: Diagnosis not present

## 2023-04-26 DIAGNOSIS — I480 Paroxysmal atrial fibrillation: Secondary | ICD-10-CM | POA: Diagnosis not present

## 2023-04-26 DIAGNOSIS — Z8679 Personal history of other diseases of the circulatory system: Secondary | ICD-10-CM

## 2023-04-26 DIAGNOSIS — I10 Essential (primary) hypertension: Secondary | ICD-10-CM

## 2023-04-26 DIAGNOSIS — I4892 Unspecified atrial flutter: Secondary | ICD-10-CM

## 2023-04-26 DIAGNOSIS — Z9889 Other specified postprocedural states: Secondary | ICD-10-CM

## 2023-04-26 NOTE — Progress Notes (Unsigned)
Enrolled patient for a 7 day Zio XT monitor to be mailed to patients home  ° °Lambert to read °

## 2023-04-26 NOTE — Patient Instructions (Signed)
 Medication Instructions:  Your physician recommends that you continue on your current medications as directed. Please refer to the Current Medication list given to you today.  *If you need a refill on your cardiac medications before your next appointment, please call your pharmacy*  Lab Work: None ordered If you have labs (blood work) drawn today and your tests are completely normal, you will receive your results only by: MyChart Message (if you have MyChart) OR A paper copy in the mail If you have any lab test that is abnormal or we need to change your treatment, we will call you to review the results.  Testing/Procedures: Suzanne Hunt- Long Term Monitor Instructions  Your physician has requested you wear a ZIO patch monitor for 7 days.  This is a single patch monitor. [employer] supplies one patch monitor per enrollment. Additional stickers are not available. Please do not apply patch if you will be having a Nuclear Stress Test,  Echocardiogram, Cardiac CT, MRI, or Chest Xray during the period you would be wearing the  monitor. The patch cannot be worn during these tests. You cannot remove and re-apply the  ZIO XT patch monitor.  Your ZIO patch monitor will be mailed 3 day USPS to your address on file. It may take 3-5 days  to receive your monitor after you have been enrolled.  Once you have received your monitor, please review the enclosed instructions. Your monitor  has already been registered assigning a specific monitor serial # to you.  Billing and Patient Assistance Program Information  We have supplied [employer] with any of your insurance information on file for billing purposes. [employer] offers a sliding scale Patient Assistance Program for patients that do not have  insurance, or whose insurance does not completely cover the cost of the ZIO monitor.  You must apply for the Patient Assistance Program to qualify for this discounted rate.  To apply, please call [employer] at [phone],  select option 4, select option 2, ask to apply for  Patient Assistance Program. Meredeth will ask your household income, and how many people  are in your household. They will quote your out-of-pocket cost based on that information.  [employer] will also be able to set up a 71-month, interest-free payment plan if needed.  Applying the monitor   Shave hair from upper left chest.  Hold abrader disc by orange tab. Rub abrader in 40 strokes over the upper left chest as  indicated in your monitor instructions.  Clean area with 4 enclosed alcohol pads. Let dry.  Apply patch as indicated in monitor instructions. Patch will be placed under collarbone on left  side of chest with arrow pointing upward.  Rub patch adhesive wings for 2 minutes. Remove white label marked 1. Remove the white  label marked 2. Rub patch adhesive wings for 2 additional minutes.  While looking in a mirror, press and release button in center of patch. A small green light will  flash 3-4 times. This will be your only indicator that the monitor has been turned on.  Do not shower for the first 24 hours. You may shower after the first 24 hours.  Press the button if you feel a symptom. You will hear a small click. Record Date, Time and  Symptom in the Patient Logbook.  When you are ready to remove the patch, follow instructions on the last 2 pages of Patient  Logbook. Stick patch monitor onto the last page of Patient Logbook.  Place Patient Logbook in the  blue and white box. Use locking tab on box and tape box closed  securely. The blue and white box has prepaid postage on it. Please place it in the mailbox as  soon as possible. Your physician should have your test results approximately 7 days after the  monitor has been mailed back to [facility].  Call [facility] Customer Care at [phone] if you have questions regarding  your ZIO XT patch monitor. Call them immediately if you see an orange light blinking on your   monitor.  If your monitor falls off in less than 4 days, contact our Monitor department at [phone].  If your monitor becomes loose or falls off after 4 days call [employer] at [phone] for  suggestions on securing your monitor    Follow-Up: At [facility], you and your health needs are our priority.  As part of our continuing mission to provide you with exceptional heart care, we have created designated Provider Care Teams.  These Care Teams include your primary Cardiologist (physician) and Advanced Practice Providers (APPs -  Physician Assistants and Nurse Practitioners) who all work together to provide you with the care you need, when you need it.  Your next appointment:   2-3 month(s)  Provider:   Ole Holts, MD

## 2023-04-26 NOTE — Progress Notes (Signed)
 Electrophysiology Office Note:   Date:  04/26/2023  ID:  Suzanne Hunt, DOB Mar 14, 1951, MRN [id number]  Primary Cardiologist: Madonna Large, DO Electrophysiologist: OLE ONEIDA HOLTS, MD      History of Present Illness:   Suzanne Hunt is a 73 y.o. female with h/o atypical atrial flutter, HTN, and AF seen today for routine electrophysiology followup.   S/p Ablation 12/04/2022 which showed multiple flutter circuits, prescribed amiodarone .   Seen in clinic 10/4 by Dr. Holts and was noted to be back in AFL. With c/o chest pain, sent for PET which showed no ischemia.   Seen by Dr. Large 02/20/2023 and was in sinus brady in the 30s. No changes made.   Since last being seen in our clinic the patient reports doing well. Overall, she denies chest pain, palpitations, dyspnea, PND, orthopnea, nausea, vomiting, dizziness, syncope, edema, weight gain, or early satiety.  That being said, she is very short in conversation and resistant to questioning, scoffing and rolling her eyes at this provider starting with my entrance to the room and introduction as a PA that works with Dr. Holts.   She is surprised to find out she is in AFL, and does not readily participate in conversation after this was revealed.   Review of systems complete and found to be negative unless listed in HPI.   EP Information / Studies Reviewed:    EKG is ordered today. Personal review as below.  EKG Interpretation Date/Time:  Friday April 26 2023 11:49:50 EST Ventricular Rate:  53 PR Interval:    QRS Duration:  74 QT Interval:  478 QTC Calculation: 448 R Axis:   42  Text Interpretation: Atrial flutter with variable A-V block T wave abnormality, consider lateral ischemia Confirmed by Lesia Sharper [phone]) on 04/26/2023 12:03:21 PM    NM Cardiac PET 02/06/2023   LV perfusion is normal. There is no evidence of ischemia. There is no evidence of infarction.   Rest left ventricular function is normal.  Rest EF: 62%. Stress left ventricular function is normal. Stress EF: 70%. End diastolic cavity size is normal. End systolic cavity size is normal.   Myocardial blood flow was computed to be 0.64ml/g/min at rest and 1.76ml/g/min at stress. Global myocardial blood flow reserve was 2.75 and was normal.   Severe coronary calcifications were present. Coronary calcifications were present in the left anterior descending artery distribution(s).patient has had PCI of LAD but no mention of stent so calcium  reported   The study is normal. The study is low risk.   Underlying rhythm atrial flutter  Physical Exam:   VS:  BP 118/60   Pulse (!) 53   Ht 5' 3 (1.6 m)   Wt 125 lb 12.8 oz (57.1 kg)   SpO2 98%   BMI 22.28 kg/m    Wt Readings from Last 3 Encounters:  04/26/23 125 lb 12.8 oz (57.1 kg)  02/20/23 125 lb 3.2 oz (56.8 kg)  01/18/23 124 lb 9.6 oz (56.5 kg)     GEN: No acute distress NECK: No JVD; No carotid bruits CARDIAC: Irregularly irregular rate and rhythm, no murmurs, rubs, gallops RESPIRATORY:  Clear to auscultation without rales, wheezing or rhonchi  ABDOMEN: Soft, non-tender, non-distended EXTREMITIES:  No edema; No deformity   ASSESSMENT AND PLAN:    Atypical Atrial flutter Paroxysmal AF S/p ablation 11/2022 Has been in and out of flutter and sinus brady She denies symptoms. I offered stopping amiodarone  and pursuing rate control only, as well as considering cardioversion.  Initially no-committal but agreed to Monitor to help clarify whether or not is paroxysmal.   She was not comfortable with me making decisions regarding her care, but did not specify why.  Will order 7 day monitor and discuss results with Dr. Cindie.   HTN Stable on current regimen   Chest discomfort Denies any further.  Normal PET as above.   Follow up with Dr. Cindie in 3 months, sooner pending results.   Signed, Ozell Prentice Passey, PA-C

## 2023-05-04 DIAGNOSIS — I4892 Unspecified atrial flutter: Secondary | ICD-10-CM | POA: Diagnosis not present

## 2023-05-17 DIAGNOSIS — I4892 Unspecified atrial flutter: Secondary | ICD-10-CM | POA: Diagnosis not present

## 2023-05-17 DIAGNOSIS — Z111 Encounter for screening for respiratory tuberculosis: Secondary | ICD-10-CM | POA: Diagnosis not present

## 2023-06-07 DIAGNOSIS — H40013 Open angle with borderline findings, low risk, bilateral: Secondary | ICD-10-CM | POA: Diagnosis not present

## 2023-06-07 DIAGNOSIS — H25813 Combined forms of age-related cataract, bilateral: Secondary | ICD-10-CM | POA: Diagnosis not present

## 2023-06-07 DIAGNOSIS — H04123 Dry eye syndrome of bilateral lacrimal glands: Secondary | ICD-10-CM | POA: Diagnosis not present

## 2023-06-07 DIAGNOSIS — H524 Presbyopia: Secondary | ICD-10-CM | POA: Diagnosis not present

## 2023-07-18 NOTE — Progress Notes (Unsigned)
  Electrophysiology Office Follow up Visit Note:    Date:  07/19/2023   ID:  Suzanne Hunt, DOB Jun 28, 1950, MRN [id number]  PCP:  Suzanne James, MD  [facility] Cardiologist:  Suzanne Lerner, DO  [facility] Electrophysiologist:  Suzanne Prude, MD    Interval History:     Suzanne Hunt is a 73 y.o. female who presents for a follow up visit.   The patient had an atrial flutter ablation December 04, 2022.  During the procedure multiple atypical flutter circuits were induced.  Amiodarone was started to help control these other arrhythmias.  She was last seen by [facility] April 26, 2023.  Today she is doing well.  She just left the gym.  She exercises regularly without limitation.       Past medical, surgical, social and family history were reviewed.  ROS:   Please see the history of present illness.    All other systems reviewed and are negative.  EKGs/Labs/Other Studies Reviewed:    The following studies were reviewed today:  May 19, 2023 ZIO monitor personally reviewed 72% burden of atrial fibrillation flutter  February 06, 2023 PET CT showed no evidence of heart artery blockages.  April 26, 2023 EKG shows atrial flutter with variable AV block and a ventricular rate of 53 bpm  February 20, 2023 EKG shows sinus rhythm with a ventricular rate of 39 bpm       Physical Exam:    VS:  BP 112/62   Pulse (!) 59   Ht 5\' 3"  (1.6 m)   Wt 127 lb (57.6 kg)   SpO2 99%   BMI 22.50 kg/m     Wt Readings from Last 3 Encounters:  07/19/23 127 lb (57.6 kg)  04/26/23 125 lb 12.8 oz (57.1 kg)  02/20/23 125 lb 3.2 oz (56.8 kg)     GEN: no distress CARD: RRR, No MRG RESP: No IWOB. CTAB.      ASSESSMENT:    1. Paroxysmal atrial flutter (HCC)   2. Paroxysmal atrial fibrillation (HCC)   3. Encounter for long-term (current) use of high-risk medication    PLAN:    In order of problems listed above:  #Atrial fibrillation and flutter The patient had  a prior catheter ablation for typical atrial flutter but had multiple atypical flutter circuits noted during the procedure in addition to atrial fibrillation.  These have been managed with amiodarone.  Recent heart monitor showed a 72% burden of atrial arrhythmias Given significant scarring observed during prior EP study, catheter ablation is not an ideal option For now would recommend stopping amiodarone to avoid off target effects and to allow her heart rate to drift up.  Recommend nodal blockers in the future should she experience rapidly conducted atrial arrhythmias  Continue Eliquis for stroke prophylaxis  #Hypertension At goal today.  Recommend checking blood pressures 1-2 times per week at home and recording the values.  Recommend bringing these recordings to the primary care physician. Continue losartan  Follow-up with EP APP in 1 year  Signed, Steffanie Dunn, MD, [facility] 07/19/2023 11:18 AM    Electrophysiology [facility]

## 2023-07-19 ENCOUNTER — Encounter: Payer: Self-pay | Admitting: Cardiology

## 2023-07-19 ENCOUNTER — Ambulatory Visit: Payer: Medicare PPO | Attending: Internal Medicine | Admitting: Cardiology

## 2023-07-19 VITALS — BP 112/62 | HR 59 | Ht 63.0 in | Wt 127.0 lb

## 2023-07-19 DIAGNOSIS — Z79899 Other long term (current) drug therapy: Secondary | ICD-10-CM

## 2023-07-19 DIAGNOSIS — I480 Paroxysmal atrial fibrillation: Secondary | ICD-10-CM | POA: Diagnosis not present

## 2023-07-19 DIAGNOSIS — I4892 Unspecified atrial flutter: Secondary | ICD-10-CM | POA: Diagnosis not present

## 2023-07-19 NOTE — Patient Instructions (Signed)
 Medication Instructions:  Your physician has recommended you make the following change in your medication:  1) STOP taking amiodarone *If you need a refill on your cardiac medications before your next appointment, please call your pharmacy*  Follow-Up: At [facility], you and your health needs are our priority.  As part of our continuing mission to provide you with exceptional heart care, our providers are all part of one team.  This team includes your primary Cardiologist (physician) and Advanced Practice Providers or APPs (Physician Assistants and Nurse Practitioners) who all work together to provide you with the care you need, when you need it.  Your next appointment:   1 year  Provider:   You will see one of the following Advanced Practice Providers on your designated Care Team:   Suzanne Hunt, [place] Suzanne Needle "Mardelle Matte" [place], PA-C Suzanne Don, NP Suzanne Brim, NP      1st Floor: - Lobby - Registration  - Pharmacy  - Lab - Cafe  2nd Floor: - PV Lab - Diagnostic Testing (echo, CT, nuclear med)  3rd Floor: - Vacant  4th Floor: - TCTS (cardiothoracic surgery) - AFib Clinic - Structural Heart Clinic - Vascular Surgery  - Vascular Ultrasound  5th Floor: - HeartCare Cardiology (general and EP) - Clinical Pharmacy for coumadin, hypertension, lipid, weight-loss medications, and med management appointments    Valet parking services will be available as well.

## 2023-08-16 ENCOUNTER — Other Ambulatory Visit: Payer: Self-pay | Admitting: Cardiology

## 2023-11-12 ENCOUNTER — Other Ambulatory Visit: Payer: Self-pay | Admitting: Cardiology

## 2023-11-26 ENCOUNTER — Other Ambulatory Visit: Payer: Self-pay | Admitting: Family Medicine

## 2023-11-26 DIAGNOSIS — Z1231 Encounter for screening mammogram for malignant neoplasm of breast: Secondary | ICD-10-CM

## 2023-12-12 DIAGNOSIS — H40013 Open angle with borderline findings, low risk, bilateral: Secondary | ICD-10-CM | POA: Diagnosis not present

## 2023-12-24 DIAGNOSIS — I480 Paroxysmal atrial fibrillation: Secondary | ICD-10-CM | POA: Diagnosis not present

## 2023-12-24 DIAGNOSIS — I129 Hypertensive chronic kidney disease with stage 1 through stage 4 chronic kidney disease, or unspecified chronic kidney disease: Secondary | ICD-10-CM | POA: Diagnosis not present

## 2023-12-24 DIAGNOSIS — N1831 Chronic kidney disease, stage 3a: Secondary | ICD-10-CM | POA: Diagnosis not present

## 2023-12-24 DIAGNOSIS — M810 Age-related osteoporosis without current pathological fracture: Secondary | ICD-10-CM | POA: Diagnosis not present

## 2023-12-24 DIAGNOSIS — E7800 Pure hypercholesterolemia, unspecified: Secondary | ICD-10-CM | POA: Diagnosis not present

## 2023-12-24 DIAGNOSIS — K219 Gastro-esophageal reflux disease without esophagitis: Secondary | ICD-10-CM | POA: Diagnosis not present

## 2023-12-24 DIAGNOSIS — I25118 Atherosclerotic heart disease of native coronary artery with other forms of angina pectoris: Secondary | ICD-10-CM | POA: Diagnosis not present

## 2023-12-24 DIAGNOSIS — Z1331 Encounter for screening for depression: Secondary | ICD-10-CM | POA: Diagnosis not present

## 2023-12-24 DIAGNOSIS — Z0000 Encounter for general adult medical examination without abnormal findings: Secondary | ICD-10-CM | POA: Diagnosis not present

## 2024-01-01 ENCOUNTER — Ambulatory Visit: Attending: Cardiology | Admitting: Cardiology

## 2024-01-01 ENCOUNTER — Telehealth: Payer: Self-pay | Admitting: Cardiology

## 2024-01-01 ENCOUNTER — Encounter: Payer: Self-pay | Admitting: Cardiology

## 2024-01-01 VITALS — BP 118/80 | HR 76 | Resp 16 | Ht 63.0 in | Wt 130.8 lb

## 2024-01-01 DIAGNOSIS — Z7901 Long term (current) use of anticoagulants: Secondary | ICD-10-CM | POA: Diagnosis not present

## 2024-01-01 DIAGNOSIS — Z955 Presence of coronary angioplasty implant and graft: Secondary | ICD-10-CM

## 2024-01-01 DIAGNOSIS — Z8679 Personal history of other diseases of the circulatory system: Secondary | ICD-10-CM

## 2024-01-01 DIAGNOSIS — Z9889 Other specified postprocedural states: Secondary | ICD-10-CM | POA: Diagnosis not present

## 2024-01-01 DIAGNOSIS — Z79899 Other long term (current) drug therapy: Secondary | ICD-10-CM

## 2024-01-01 DIAGNOSIS — I10 Essential (primary) hypertension: Secondary | ICD-10-CM | POA: Diagnosis not present

## 2024-01-01 DIAGNOSIS — I252 Old myocardial infarction: Secondary | ICD-10-CM | POA: Diagnosis not present

## 2024-01-01 DIAGNOSIS — I2510 Atherosclerotic heart disease of native coronary artery without angina pectoris: Secondary | ICD-10-CM

## 2024-01-01 DIAGNOSIS — I4892 Unspecified atrial flutter: Secondary | ICD-10-CM | POA: Diagnosis not present

## 2024-01-01 DIAGNOSIS — I480 Paroxysmal atrial fibrillation: Secondary | ICD-10-CM

## 2024-01-01 DIAGNOSIS — E782 Mixed hyperlipidemia: Secondary | ICD-10-CM

## 2024-01-01 NOTE — Telephone Encounter (Signed)
 Patient called to follow-up on her EKG results.

## 2024-01-01 NOTE — Telephone Encounter (Signed)
 Left message for patient to call back

## 2024-01-01 NOTE — Patient Instructions (Signed)
 Medication Instructions:  Your physician recommends that you continue on your current medications as directed. Please refer to the Current Medication list given to you today.  *If you need a refill on your cardiac medications before your next appointment, please call your pharmacy*  Lab Work: IN 6 MONTHS at any [facility]: H&H and BMP   If you have labs (blood work) drawn today and your tests are completely normal, you will receive your results only by: MyChart Message (if you have MyChart) OR A paper copy in the mail If you have any lab test that is abnormal or we need to change your treatment, we will call you to review the results.  Testing/Procedures: NONE  Follow-Up: At [facility], you and your health needs are our priority.  As part of our continuing mission to provide you with exceptional heart care, our providers are all part of one team.  This team includes your primary Cardiologist (physician) and Advanced Practice Providers or APPs (Physician Assistants and Nurse Practitioners) who all work together to provide you with the care you need, when you need it.  Your next appointment:   1 year(s)  Provider:   Madonna Large, DO

## 2024-01-01 NOTE — Progress Notes (Signed)
 Cardiology Office Note:  .   Date:  01/04/2024  ID:  Suzanne Hunt, DOB 22-Jun-1950, MRN [id number] PCP:  Sun, Vyvyan, MD  Former Cardiology Providers: Dr. Gordy Bergamo and Emmalene Lawrence, APRN, FNP-C  [facility] Providers Cardiologist:  Madonna Large, DO Electrophysiologist:  OLE ONEIDA HOLTS, MD , [facility] (established care 07/16/2019) Electrophysiologist:  OLE ONEIDA HOLTS, MD  Click to update primary MD,subspecialty MD or APP then REFRESH:1}    Chief Complaint  Patient presents with   Paroxysmal atrial flutter   Coronary artery disease involving native coronary artery of   Follow-up    History of Present Illness: .   Suzanne Hunt is a 73 y.o. African-American female whose past medical history and cardiovascular risk factors includes: Severe coronary artery calcification, aortic atherosclerosis, hypertension, hyperlipidemia, history of anterior STEMI status post PCI to the mid/distal LAD with balloon angioplasty, persistent atrial fibrillation, persistent atrial flutter, status post ablation, postmenopausal female, advanced age.   Patient had anterior STEMI back in August 2019 underwent angioplasty/PCI to the mid/distal LAD.  Since then she has been doing well from a cardiovascular standpoint and focusing on secondary prevention.  She also has a history of paroxysmal atrial flutter.  In the past she wanted to pursue only rate control strategy but given how active she is for her age and overall good candidate for possible ablation she was referred to EP for further guidance.  She did undergo atrial flutter ablation back in August 2024 and per report was noted to have typical atrial flutter, atypical atrial flutter, and atrial fibrillation.  Postprocedure she was started on amiodarone .  During her follow-up visit with the EP she voiced concerns for chest pain and underwent cardiac PET/CT which was reported to be low risk study.  She had a cardiac monitor in  January/February 2025 which noted atrial flutter/fibrillation burden at 72%.  Followed up with Dr. HOLTS in April 2025 progress note reviewed.  In summary, given the recent findings of scar burden during her EP study repeat catheter ablation is not considered to be an ideal option.  Amiodarone  was discontinued.  Recommended AV nodal blocking agents in the future for rapidly conducted atrial rhythms.  She will continue Eliquis  for thromboembolic prophylaxis.  Clinically denies anginal chest pain or heart failure symptoms. Functional capacity remains relatively stable since last office visit, she goes to the gym every day and at least does treadmill for 60 minutes and resistance training as well.  Patient does not endorse evidence of bleeding.   Review of Systems: .   Review of Systems  Cardiovascular:  Negative for chest pain, claudication, irregular heartbeat, leg swelling, near-syncope, orthopnea, palpitations, paroxysmal nocturnal dyspnea and syncope.  Respiratory:  Negative for shortness of breath.   Hematologic/Lymphatic: Negative for bleeding problem.    Studies Reviewed:   EKG: EKG Interpretation Date/Time:  Wednesday January 01 2024 15:37:29 EDT Ventricular Rate:  73 PR Interval:    QRS Duration:  62 QT Interval:  396 QTC Calculation: 436 R Axis:   5  Text Interpretation: Atrial flutter with variable A-V block Low voltage QRS Cannot rule out Anterior infarct (cited on or before 01-Jan-2024) When compared with ECG of 19-Jul-2023 11:17, Since last tracing rate faster Confirmed by Large Madonna [phone]) on 01/01/2024 3:52:01 PM  Echocardiogram: 12/21/2021:  Normal LV systolic function with visual EF 55-60%. Left ventricle cavity is normal in size. Normal left ventricular wall thickness. Normal global wall motion. Unable to evaluate diastolic function due to atrial flutter.  Normal LAP.  Trace aortic regurgitation.  Moderate (Grade II) mitral regurgitation.  Mild tricuspid  regurgitation. No evidence of pulmonary hypertension.  Compared to 07/24/2019: MR remains stable, moderate LAE is now normal, otherwise no significant change.  Cardiac PET/CT 02/06/2023   LV perfusion is normal. There is no evidence of ischemia. There is no evidence of infarction.   Rest left ventricular function is normal. Rest EF: 62%. Stress left ventricular function is normal. Stress EF: 70%. End diastolic cavity size is normal. End systolic cavity size is normal.   Myocardial blood flow was computed to be 0.64ml/g/min at rest and 1.76ml/g/min at stress. Global myocardial blood flow reserve was 2.75 and was normal.   Severe coronary calcifications were present. Coronary calcifications were present in the left anterior descending artery distribution(s).patient has had PCI of LAD but no mention of stent so calcium  reported   The study is normal. The study is low risk.   Underlying rhythm atrial flutter  Left Heart Catheterization 11/08/20:  LV: LV pressure: 107/0, EDP 5 mmHg.  Aortic pressure 100/49, mean 69 mmHg.  There is no pressure gradient across the aortic valve.  Low normal LV systolic function 50% with mid to distal anterolateral hypokinesis. No mitral regurgitation.   Left main: Long vessel, mild calcification is noted. LAD: Large vessel, mild diffuse coronary calcification is evident.  There is minimal disease in the midsegment at most 20 to 30%.  Previously performed balloon angioplasty site on 12/08/2017 the mid LAD and apical LAD is widely patent. CX: Moderate caliber vessel, smooth and normal.  Mild coronary calcification is evident. RCA: Mid segment has a 30% stenosis.  Otherwise vessel is smooth.  There is mild calcification noted in the proximal RCA.   Recommendation: Patient has very mild disease and previously performed angioplasty site at the mid LAD and distal LAD on 12/08/2017 is widely patent.  Continue anticoagulation for paroxysmal atrial fibrillation, patient was in  coarse atrial fibrillation today during heart catheterization.  30 mill contrast utilized.  RADIOLOGY: NA  Risk Assessment/Calculations:   Click Here to Calculate/Change CHADS2VASc Score The patient's CHADS2-VASc score is 4, indicating a 4.8% annual risk of stroke.   CHF History: No HTN History: Yes Diabetes History: No Stroke History: No Vascular Disease History: Yes  Labs:       Latest Ref Rng & Units 03/12/2023    9:02 AM 11/20/2022    8:28 AM 07/12/2022    8:59 AM  CBC  WBC 3.4 - 10.8 x10E3/uL  3.1    Hemoglobin 11.1 - 15.9 g/dL 88.0  88.5  88.3   Hematocrit 34.0 - 46.6 % 37.4  35.5  36.9   Platelets 150 - 450 x10E3/uL  142         Latest Ref Rng & Units 03/12/2023    9:02 AM 01/18/2023    1:21 PM 11/20/2022    8:28 AM  BMP  Glucose 70 - 99 mg/dL 75  70  78   BUN 8 - 27 mg/dL 20  21  24    Creatinine 0.57 - 1.00 mg/dL 8.73  8.68  8.67   BUN/Creat Ratio 12 - 28 16  16  18    Sodium 134 - 144 mmol/L 144  141  141   Potassium 3.5 - 5.2 mmol/L 4.7  5.3  4.2   Chloride 96 - 106 mmol/L 107  104  103   CO2 20 - 29 mmol/L 23  23  25    Calcium  8.7 - 10.3 mg/dL 9.1  9.2  8.9       Latest Ref Rng & Units 03/12/2023    9:02 AM 01/18/2023    1:21 PM 11/20/2022    8:28 AM  CMP  Glucose 70 - 99 mg/dL 75  70  78   BUN 8 - 27 mg/dL 20  21  24    Creatinine 0.57 - 1.00 mg/dL 8.73  8.68  8.67   Sodium 134 - 144 mmol/L 144  141  141   Potassium 3.5 - 5.2 mmol/L 4.7  5.3  4.2   Chloride 96 - 106 mmol/L 107  104  103   CO2 20 - 29 mmol/L 23  23  25    Calcium  8.7 - 10.3 mg/dL 9.1  9.2  8.9   Total Protein 6.0 - 8.5 g/dL  6.7    Total Bilirubin 0.0 - 1.2 mg/dL  0.6    Alkaline Phos 44 - 121 IU/L  276    AST 0 - 40 IU/L  56    ALT 0 - 32 IU/L  62      Lab Results  Component Value Date   CHOL 129 07/12/2022   HDL 47 07/12/2022   LDLCALC 73 07/12/2022   LDLDIRECT 81 07/12/2022   TRIG 35 07/12/2022   CHOLHDL 3.1 12/09/2017   No results for input(s): LIPOA in the last 8760  hours. No components found for: NTPROBNP No results for input(s): PROBNP in the last 8760 hours. Recent Labs    01/18/23 1321  TSH 1.690    Physical Exam:    Today's Vitals   01/01/24 1534  BP: 118/80  Pulse: 76  Resp: 16  SpO2: 96%  Weight: 130 lb 12.8 oz (59.3 kg)  Height: 5' 3 (1.6 m)   Body mass index is 23.17 kg/m. Wt Readings from Last 3 Encounters:  01/01/24 130 lb 12.8 oz (59.3 kg)  07/19/23 127 lb (57.6 kg)  04/26/23 125 lb 12.8 oz (57.1 kg)    Physical Exam  Constitutional: No distress.  Age appropriate, hemodynamically stable.   Neck: No JVD present.  Cardiovascular: Regular rhythm, S1 normal, S2 normal, intact distal pulses and normal pulses. Bradycardia present. Exam reveals no gallop, no S3 and no S4.  No murmur heard. Pulmonary/Chest: Effort normal and breath sounds normal. No stridor. She has no wheezes. She has no rales.  Abdominal: Soft. Bowel sounds are normal. She exhibits no distension. There is no abdominal tenderness.  Musculoskeletal:        General: No edema.     Cervical back: Neck supple.  Neurological: She is alert and oriented to person, place, and time. She has intact cranial nerves (2-12).  Skin: Skin is warm and moist.   Impression & Recommendation(s):  Impression:   ICD-10-CM   1. Persistent atrial flutter (HCC)  I48.92 EKG 12-Lead    Hemoglobin and hematocrit, blood    Basic metabolic panel with GFR    Basic metabolic panel with GFR    Hemoglobin and hematocrit, blood    2. Persistent atrial fibrillation (HCC)  I48.0 Hemoglobin and hematocrit, blood    Basic metabolic panel with GFR    Basic metabolic panel with GFR    Hemoglobin and hematocrit, blood    3. S/P ablation of atrial flutter  Z98.890    Z86.79     4. Long term (current) use of anticoagulants  Z79.01     5. Long term current use of antiarrhythmic drug  Z79.899     6. Coronary artery  disease involving native coronary artery of native heart without angina  pectoris  I25.10     7. History of coronary angioplasty with insertion of stent  Z95.5     8. Hx of ST elevation myocardial infarction  I25.2     9. Essential hypertension  I10     10. Mixed hyperlipidemia  E78.2        Recommendation(s):  Persistent atrial flutter/fibrillation (HCC) S/P ablation of atrial flutter Underwent atrial flutter, atypical flutter, atrial fibrillation ablation in August 2024 with Dr. Cindie. Was placed on amiodarone  for a short period of time related to continued Given the degree of scar burden no plans for repeat ablation at this time Recommended rate control strategy for atrial arrhythmias if needed. Currently not on AV nodal blocking agents. Continue Eliquis  for thromboembolic prophylaxis.  Long term (current) use of anticoagulants and antiarrhythmic medications Patient does not endorse evidence of bleeding. Risks, benefits, alternatives to anticoagulation discussed. Patient had labs with her PCP last week, will get copies.  Coronary artery disease involving native coronary artery of native heart without angina pectoris History of coronary angioplasty with insertion of stent Severe coronary artery calcification Hx of ST elevation myocardial infarction Denies anginal chest pain. Had cardiac PET/CT since last office visit which was reported to be low risk study. Reemphasized the importance of secondary prevention with focus on improving her modifiable cardiovascular risk factors such as glycemic control, lipid management, blood pressure control. Given her prior STEMI, coronary interventions, and severe CAC recommended goal LDL less than 55 mg/dL. Patient remains reluctant to uptitrate lipid-lowering agents at this time and will discuss with PCP  Essential hypertension Office blood pressures are well-controlled.   Continue losartan  25 mg p.o. daily.  Mixed hyperlipidemia Currently on Crestor  20 mg p.o. daily.   She denies myalgia or other side  effects. LDL goal <55 mg/dL. In the past we discussed up titration of lipid-lowering agents, patient hesitant.    Orders Placed:  Orders Placed This Encounter  Procedures   Hemoglobin and hematocrit, blood    Standing Status:   Future    Number of Occurrences:   1    Expected Date:   06/30/2024    Expiration Date:   12/31/2024   Basic metabolic panel with GFR    Standing Status:   Future    Number of Occurrences:   1    Expected Date:   06/30/2024    Expiration Date:   12/31/2024   Lab report - scanned   EKG 12-Lead    Final Medication List:   No orders of the defined types were placed in this encounter.   There are no discontinued medications.    Current Outpatient Medications:    Calcium  Carb-Cholecalciferol (CALCIUM  500 + D3 PO), Take 1 tablet by mouth daily., Disp: , Rfl:    ELIQUIS  5 MG TABS tablet, TAKE 1 TABLET TWICE A DAY, Disp: 180 tablet, Rfl: 3   ibandronate (BONIVA) 150 MG tablet, Take 150 mg by mouth every 30 (thirty) days. Take in the morning with a full glass of water, on an empty stomach, and do not take anything else by mouth or lie down for the next 30 min. Takes the first week of the month., Disp: , Rfl:    losartan  (COZAAR ) 25 MG tablet, TAKE 1 TABLET EVERY EVENING, Disp: 90 tablet, Rfl: 0   Multiple Vitamin (MULTIVITAMIN WITH MINERALS) TABS tablet, Take 1 tablet by mouth daily., Disp: , Rfl:    nitroGLYCERIN  (NITROSTAT )  0.4 MG SL tablet, TAKE AS INSTRUCTED BY YOUR PRESCRIBER, Disp: 25 tablet, Rfl: 3   Omega-3 Fatty Acids (FISH OIL ADULT GUMMIES PO), Take 2 each by mouth daily., Disp: , Rfl:    omeprazole (PRILOSEC) 20 MG capsule, Take 20 mg by mouth daily., Disp: , Rfl:    rosuvastatin  (CRESTOR ) 20 MG tablet, TAKE 1 TABLET DAILY, Disp: 90 tablet, Rfl: 3  Consent:   N/A  Disposition:   1 year follow-up sooner if needed.  Her questions and concerns were addressed to her satisfaction. She voices understanding of the recommendations provided during this  encounter.    Signed, Madonna Michele HAS, [facility]  A Division of [facility] [address], [place], [place] [address]

## 2024-01-02 ENCOUNTER — Ambulatory Visit

## 2024-01-02 NOTE — Telephone Encounter (Signed)
 Patient was returning call. Please advise ?

## 2024-01-03 ENCOUNTER — Ambulatory Visit: Payer: Self-pay | Admitting: Cardiology

## 2024-01-03 NOTE — Telephone Encounter (Signed)
 Pt was returning nurse call and is requesting a callback. Please advise.

## 2024-01-03 NOTE — Telephone Encounter (Signed)
 Called pt reports MD did not go over EKG results at [facility].  Advised pt EKG looks stable from previous EKG in April when saw Dr. Cindie.    Advised pt will send to MD to review.

## 2024-01-03 NOTE — Telephone Encounter (Signed)
 Left voice message to call back 9/19

## 2024-01-04 ENCOUNTER — Encounter: Payer: Self-pay | Admitting: Cardiology

## 2024-01-07 ENCOUNTER — Ambulatory Visit
Admission: RE | Admit: 2024-01-07 | Discharge: 2024-01-07 | Disposition: A | Discharge status: Home / Self Care | Source: Ambulatory Visit | Attending: Family Medicine | Admitting: Family Medicine

## 2024-01-07 DIAGNOSIS — Z1231 Encounter for screening mammogram for malignant neoplasm of breast: Secondary | ICD-10-CM

## 2024-01-07 NOTE — Telephone Encounter (Signed)
 Atrial flutter with variable A-V block Low voltage QRS Cannot rule out Anterior infarct (cited on or before 01-Jan-2024).  No change compared to prior tracing.   Katisha Shimizu [place], DO, FACC

## 2024-01-07 NOTE — Telephone Encounter (Signed)
 Left a message to call back.

## 2024-01-08 NOTE — Telephone Encounter (Signed)
 Left message for patient, no change from prior ECG. She is to call back with questions.

## 2024-01-08 NOTE — Telephone Encounter (Signed)
 Pt requesting callback in regards to testing results. Please advise.

## 2024-01-09 NOTE — Progress Notes (Signed)
 Patient never viewed in mychart. Printed, highlighted Doctors comments and mailed to patient

## 2024-01-20 DIAGNOSIS — H40013 Open angle with borderline findings, low risk, bilateral: Secondary | ICD-10-CM | POA: Diagnosis not present

## 2024-02-06 ENCOUNTER — Emergency Department (HOSPITAL_COMMUNITY)
Admission: EM | Admit: 2024-02-06 | Discharge: 2024-02-07 | Disposition: A | Discharge status: Home / Self Care | Attending: Emergency Medicine | Admitting: Emergency Medicine

## 2024-02-06 ENCOUNTER — Emergency Department (HOSPITAL_COMMUNITY)

## 2024-02-06 ENCOUNTER — Other Ambulatory Visit: Payer: Self-pay

## 2024-02-06 ENCOUNTER — Encounter (HOSPITAL_COMMUNITY): Payer: Self-pay

## 2024-02-06 DIAGNOSIS — I10 Essential (primary) hypertension: Secondary | ICD-10-CM | POA: Diagnosis not present

## 2024-02-06 DIAGNOSIS — I2510 Atherosclerotic heart disease of native coronary artery without angina pectoris: Secondary | ICD-10-CM | POA: Diagnosis not present

## 2024-02-06 DIAGNOSIS — Z7901 Long term (current) use of anticoagulants: Secondary | ICD-10-CM | POA: Diagnosis not present

## 2024-02-06 DIAGNOSIS — Z79899 Other long term (current) drug therapy: Secondary | ICD-10-CM | POA: Insufficient documentation

## 2024-02-06 DIAGNOSIS — R1010 Upper abdominal pain, unspecified: Secondary | ICD-10-CM | POA: Diagnosis present

## 2024-02-06 DIAGNOSIS — R000 Tachycardia, unspecified: Secondary | ICD-10-CM | POA: Diagnosis not present

## 2024-02-06 DIAGNOSIS — I4891 Unspecified atrial fibrillation: Secondary | ICD-10-CM | POA: Diagnosis not present

## 2024-02-06 DIAGNOSIS — R079 Chest pain, unspecified: Secondary | ICD-10-CM | POA: Diagnosis not present

## 2024-02-06 LAB — COMPREHENSIVE METABOLIC PANEL WITH GFR
ALT: 22 U/L (ref 0–44)
AST: 36 U/L (ref 15–41)
Albumin: 4.3 g/dL (ref 3.5–5.0)
Alkaline Phosphatase: 101 U/L (ref 38–126)
Anion gap: 12 (ref 5–15)
BUN: 25 mg/dL — ABNORMAL HIGH (ref 8–23)
CO2: 25 mmol/L (ref 22–32)
Calcium: 10.1 mg/dL (ref 8.9–10.3)
Chloride: 103 mmol/L (ref 98–111)
Creatinine, Ser: 1.3 mg/dL — ABNORMAL HIGH (ref 0.44–1.00)
GFR, Estimated: 43 mL/min — ABNORMAL LOW (ref >60)
Glucose, Bld: 105 mg/dL — ABNORMAL HIGH (ref 70–99)
Potassium: 4.1 mmol/L (ref 3.5–5.1)
Sodium: 139 mmol/L (ref 135–145)
Total Bilirubin: 1.4 mg/dL — ABNORMAL HIGH (ref 0.0–1.2)
Total Protein: 7.6 g/dL (ref 6.5–8.1)

## 2024-02-06 LAB — CBC
HCT: 40.9 % (ref 36.0–46.0)
Hemoglobin: 12.8 g/dL (ref 12.0–15.0)
MCH: 30 pg (ref 26.0–34.0)
MCHC: 31.3 g/dL (ref 30.0–36.0)
MCV: 95.8 fL (ref 80.0–100.0)
Platelets: 169 K/uL (ref 150–400)
RBC: 4.27 MIL/uL (ref 3.87–5.11)
RDW: 13.9 % (ref 11.5–15.5)
WBC: 4.9 K/uL (ref 4.0–10.5)
nRBC: 0 % (ref 0.0–0.2)

## 2024-02-06 LAB — TROPONIN T, HIGH SENSITIVITY: Troponin T High Sensitivity: <15 ng/L (ref 0–19)

## 2024-02-06 LAB — LIPASE, BLOOD: Lipase: 25 U/L (ref 11–51)

## 2024-02-06 MED ORDER — ALUM & MAG HYDROXIDE-SIMETH 200-200-20 MG/5ML PO SUSP
30.0000 mL | Freq: Once | ORAL | Status: AC
  Administered 2024-02-06: 30 mL via ORAL
  Filled 2024-02-06: qty 30

## 2024-02-06 NOTE — ED Triage Notes (Signed)
 Pt reports epigastric abdominal pain that radiates to chest that started around 2015. Pt reports this started suddenly and denies nausea or emesis.

## 2024-02-06 NOTE — ED Provider Triage Note (Signed)
 Emergency Medicine Provider Triage Evaluation Note  Suzanne Hunt , a 73 y.o. female  was evaluated in triage.  Pt complains of abd pain. Pt report upper abd pain that started today. No other associated sxs.  No fever, chills, cp, sob, cough, back pain, dysuria, n/v/d.  Report hx of MI in the past  Review of Systems  Positive: As above Negative: As above  Physical Exam  BP (!) 146/84 (BP Location: Left Arm)   Pulse 90   Temp 98.6 F (37 C) (Oral)   Resp 18   SpO2 99%  Gen:   Awake, no distress   Resp:  Normal effort  MSK:   Moves extremities without difficulty  Other:    Medical Decision Making  Medically screening exam initiated at 9:15 PM.  Appropriate orders placed.  Suzanne Hunt was informed that the remainder of the evaluation will be completed by another provider, this initial triage assessment does not replace that evaluation, and the importance of remaining in the ED until their evaluation is complete.     Nivia Colon, PA-C 02/06/24 2116

## 2024-02-06 NOTE — Discharge Instructions (Addendum)
 You have been evaluated for your symptoms.  No concerning finding were noted on today exam.  Please follow up closely with your doctor for further care.

## 2024-02-06 NOTE — ED Provider Notes (Signed)
  EMERGENCY DEPARTMENT AT [facility] Provider Note   CSN: [id number] Arrival date & time: 02/06/24  2037     Patient presents with: Abdominal Pain   Suzanne Hunt is a 73 y.o. female.   The history is provided by the patient and medical records. No language interpreter was used.  Abdominal Pain    73 year old female with history of atrial fibrillation currently on Eliquis , CAD, prior MI status post coronary graft and revascularization, hypertension, aortic regurgitation, presenting with complaint of abdominal pain.  Patient endorsed upper abdominal pain that started earlier today.  Pain is without any lightheadedness or dizziness no nausea no vomiting no diarrhea constipation no urinary symptoms.  She denies any significant chest pain or shortness of breath.  States pain is mild at this time but states in the past she had a heart attack without any significant chest pain but she did have some abdominal pain and diarrhea when she was diagnosed with having a heart attack.  Tonight she ate a salad for dinner.  She is a vegetarian.  Prior to Admission medications   Medication Sig Start Date End Date Taking? Authorizing Provider  Calcium  Carb-Cholecalciferol (CALCIUM  500 + D3 PO) Take 1 tablet by mouth daily.    [provider]  ELIQUIS  5 MG TABS tablet TAKE 1 TABLET TWICE A DAY 02/25/23   Tolia, Sunit, DO  ibandronate (BONIVA) 150 MG tablet Take 150 mg by mouth every 30 (thirty) days. Take in the morning with a full glass of water, on an empty stomach, and do not take anything else by mouth or lie down for the next 30 min. Takes the first week of the month.    [provider]  losartan  (COZAAR ) 25 MG tablet TAKE 1 TABLET EVERY EVENING 11/14/23   Tolia, Sunit, DO  Multiple Vitamin (MULTIVITAMIN WITH MINERALS) TABS tablet Take 1 tablet by mouth daily.    [provider]  nitroGLYCERIN  (NITROSTAT ) 0.4 MG SL tablet TAKE AS INSTRUCTED BY  YOUR PRESCRIBER 08/16/23   Michele Richardson, DO  Omega-3 Fatty Acids (FISH OIL ADULT GUMMIES PO) Take 2 each by mouth daily.    [provider]  omeprazole (PRILOSEC) 20 MG capsule Take 20 mg by mouth daily. 12/09/19   [provider]  rosuvastatin  (CRESTOR ) 20 MG tablet TAKE 1 TABLET DAILY 04/15/23   Tolia, Sunit, DO    Allergies: Actinel [[id number]]    Review of Systems  Gastrointestinal:  Positive for abdominal pain.  All other systems reviewed and are negative.   Updated Vital Signs BP (!) 146/84 (BP Location: Left Arm)   Pulse 90   Temp 98.6 F (37 C) (Oral)   Resp 18   SpO2 99%   Physical Exam Vitals and nursing note reviewed.  Constitutional:      General: She is not in acute distress.    Appearance: She is well-developed.  HENT:     Head: Atraumatic.  Eyes:     Conjunctiva/sclera: Conjunctivae normal.  Cardiovascular:     Rate and Rhythm: Rhythm irregular.  Pulmonary:     Effort: Pulmonary effort is normal.  Abdominal:     Palpations: Abdomen is soft.     Tenderness: There is no abdominal tenderness. There is no guarding or rebound.     Comments: Negative Murphy sign, no pain at McBurney's point  Musculoskeletal:     Cervical back: Neck supple.  Skin:    Findings: No rash.  Neurological:     Mental  Status: She is alert.  Psychiatric:        Mood and Affect: Mood normal.     (all labs ordered are listed, but only abnormal results are displayed) Labs Reviewed  COMPREHENSIVE METABOLIC PANEL WITH GFR - Abnormal; Notable for the following components:      Result Value   Glucose, Bld 105 (*)    BUN 25 (*)    Creatinine, Ser 1.30 (*)    Total Bilirubin 1.4 (*)    GFR, Estimated 43 (*)    All other components within normal limits  CBC  LIPASE, BLOOD  TROPONIN T, HIGH SENSITIVITY  TROPONIN T, HIGH SENSITIVITY    EKG: EKG Interpretation Date/Time:  Thursday February 06 2024 21:00:59 EDT Ventricular Rate:  82 PR Interval:    QRS  Duration:  251 QT Interval:  501 QTC Calculation: 586 R Axis:   66  Text Interpretation: Atrial flutter Non-specific ST-t changes Confirmed by Bernard Drivers ([id number]) on 02/06/2024 9:57:23 PM  Radiology: [facility] Chest 2 View Result Date: 02/06/2024 CLINICAL DATA:  Chest pain. EXAM: DG CHEST 2V COMPARISON:  Chest radiograph dated 12/08/2017. FINDINGS: The heart size and mediastinal contours are within normal limits. Both lungs are clear. The visualized skeletal structures are unremarkable. IMPRESSION: No active cardiopulmonary disease. Electronically Signed   By: Vanetta Chou M.D.   On: 02/06/2024 21:16     Procedures   Medications Ordered in the ED - No data to display  Clinical Course as of 02/06/24 2341  Thu Feb 06, 2024  3349 73 year old female, upper abdominal pain, noother symptoms to go along with it. No cp, sob.  [CG]    Clinical Course User Index [CG] Ruthell Lonni FALCON, PA-C                                 Medical Decision Making Amount and/or Complexity of Data Reviewed Labs: ordered. Radiology: ordered.   BP (!) 161/76   Pulse 82   Temp 98.6 F (37 C) (Oral)   Resp (!) 21   SpO2 100%   64:61 PM  73 year old female with history of atrial fibrillation currently on Eliquis , CAD, prior MI status post coronary graft and revascularization, hypertension, aortic regurgitation, presenting with complaint of abdominal pain.  Patient endorsed upper abdominal pain that started earlier today.  Pain is without any lightheadedness or dizziness no nausea no vomiting no diarrhea constipation no urinary symptoms.  She denies any significant chest pain or shortness of breath.  States pain is mild at this time but states in the past she had a heart attack without any significant chest pain but she did have some abdominal pain and diarrhea when she was diagnosed with having a heart attack.  Tonight she ate a salad for dinner.  She is a vegetarian.  Exam overall reassuring, patient  resting comfortably appears to be in no acute discomfort.  Heart with a irregularly irregular heart rhythm, lungs are clear abdomen is soft nontender bowel sounds are present.  -Labs ordered, independently viewed and interpreted by me.  Labs remarkable for Cr 1.3, similar to baseline.  Normal initial trop, normal WBC -The patient was maintained on a cardiac monitor.  I personally viewed and interpreted the cardiac monitored which showed an underlying rhythm of: atrial flutter -Imaging independently viewed and interpreted by me and I agree with radiologist's interpretation.  Result remarkable for CXR unremarkable, abd/pelvis CT pending -This patient presents  to the ED for concern of abd pain, this involves an extensive number of treatment options, and is a complaint that carries with it a high risk of complications and morbidity.  The differential diagnosis includes gastritis, GERD, MI, pancreatitis, cholecystitis, appendicitis -Co morbidities that complicate the patient evaluation includes afib, CAD, HTN -Treatment includes GI cocktail -Reevaluation of the patient after these medicines showed that the patient improved -PCP office notes or outside notes reviewed -Discussion with attending Dr. Bernard.  Pt sign out to oncoming provider who will f/u on delta trop and CT scan of abd/pelvis.  If neg, pt stable for discharge -Escalation to admission/observation considered: dispo pending.       Final diagnoses:  Upper abdominal pain    ED Discharge Orders     None          Nivia Colon, PA-C 02/06/24 2341    Bernard Drivers, MD 02/07/24 [phone]

## 2024-02-07 ENCOUNTER — Encounter (HOSPITAL_COMMUNITY): Payer: Self-pay

## 2024-02-07 ENCOUNTER — Emergency Department (HOSPITAL_COMMUNITY)

## 2024-02-07 DIAGNOSIS — R1010 Upper abdominal pain, unspecified: Secondary | ICD-10-CM | POA: Diagnosis not present

## 2024-02-07 DIAGNOSIS — R1013 Epigastric pain: Secondary | ICD-10-CM | POA: Diagnosis not present

## 2024-02-07 DIAGNOSIS — D259 Leiomyoma of uterus, unspecified: Secondary | ICD-10-CM | POA: Diagnosis not present

## 2024-02-07 LAB — TROPONIN T, HIGH SENSITIVITY: Troponin T High Sensitivity: <15 ng/L (ref 0–19)

## 2024-02-07 MED ORDER — IOHEXOL 300 MG/ML  SOLN
75.0000 mL | Freq: Once | INTRAMUSCULAR | Status: AC | PRN
Start: 2024-02-07 — End: 2024-02-07
  Administered 2024-02-07: 75 mL via INTRAVENOUS

## 2024-02-07 NOTE — ED Provider Notes (Signed)
  Physical Exam  BP 131/73   Pulse 89   Temp 98.1 F (36.7 C) (Oral)   Resp 17   SpO2 99%   Physical Exam Vitals and nursing note reviewed.  Constitutional:      General: She is not in acute distress.    Appearance: She is not toxic-appearing.  HENT:     Head: Normocephalic and atraumatic.  Pulmonary:     Effort: No respiratory distress.  Skin:    Coloration: Skin is not jaundiced or pale.  Neurological:     Mental Status: She is alert and oriented to person, place, and time.  Psychiatric:        Behavior: Behavior normal.     Procedures  Procedures  ED Course / MDM   Clinical Course as of 02/07/24 0205  Thu Feb 06, 2024  5410 73 year old female, upper abdominal pain, noother symptoms to go along with it. No cp, sob.  [CG]    Clinical Course User Index [CG] Ruthell Lonni FALCON, PA-C   Medical Decision Making Amount and/or Complexity of Data Reviewed Labs: ordered. Radiology: ordered.  Risk OTC drugs. Prescription drug management.   73 year old female signed out at shift change pending imaging.  Please see.  Throughout her note.  In short, 73 year old female presenting for abdominal pain.  Signed out to me pending delta troponin and CT scan.  Delta troponin negative.  CT scan unremarkable.  Patient to be discharged home at this time per previous provider plan.  Advised to follow-up outpatient.  Given return precautions stable to discharge.       Ruthell Lonni FALCON, PA-C 02/07/24 0205    Theadore Ozell HERO, MD 02/07/24 [phone]

## 2024-02-12 ENCOUNTER — Other Ambulatory Visit: Payer: Self-pay | Admitting: Cardiology

## 2024-02-12 DIAGNOSIS — I4892 Unspecified atrial flutter: Secondary | ICD-10-CM

## 2024-02-12 DIAGNOSIS — I10 Essential (primary) hypertension: Secondary | ICD-10-CM

## 2024-02-12 NOTE — Telephone Encounter (Signed)
 Eliquis  5mg  refill request received. Patient is 73 years old, weight-59.3kg, Crea-1.30 on 02/06/24, Diagnosis-Aflutter, and last seen by Dr. Michele on 01/01/24. Dose is appropriate based on dosing criteria. Will send in refill to requested pharmacy.    Losartan  refill per last OV on 01/01/24 it states:  Essential hypertension Office blood pressures are well-controlled.   Continue losartan  25 mg p.o. daily.

## 2024-03-04 ENCOUNTER — Other Ambulatory Visit: Payer: Self-pay | Admitting: Cardiology

## 2024-04-07 ENCOUNTER — Other Ambulatory Visit: Payer: Self-pay | Admitting: Cardiology

## 2024-05-05 ENCOUNTER — Other Ambulatory Visit: Payer: Self-pay

## 2024-05-05 ENCOUNTER — Encounter (HOSPITAL_BASED_OUTPATIENT_CLINIC_OR_DEPARTMENT_OTHER): Payer: Self-pay

## 2024-05-05 ENCOUNTER — Emergency Department (HOSPITAL_BASED_OUTPATIENT_CLINIC_OR_DEPARTMENT_OTHER)
Admission: EM | Admit: 2024-05-05 | Discharge: 2024-05-05 | Disposition: A | Discharge status: Home / Self Care | Attending: Emergency Medicine | Admitting: Emergency Medicine

## 2024-05-05 DIAGNOSIS — Y92410 Unspecified street and highway as the place of occurrence of the external cause: Secondary | ICD-10-CM | POA: Insufficient documentation

## 2024-05-05 DIAGNOSIS — I2510 Atherosclerotic heart disease of native coronary artery without angina pectoris: Secondary | ICD-10-CM | POA: Diagnosis not present

## 2024-05-05 DIAGNOSIS — Z7901 Long term (current) use of anticoagulants: Secondary | ICD-10-CM | POA: Insufficient documentation

## 2024-05-05 DIAGNOSIS — I10 Essential (primary) hypertension: Secondary | ICD-10-CM | POA: Insufficient documentation

## 2024-05-05 DIAGNOSIS — Z79899 Other long term (current) drug therapy: Secondary | ICD-10-CM | POA: Diagnosis not present

## 2024-05-05 DIAGNOSIS — R0789 Other chest pain: Secondary | ICD-10-CM

## 2024-05-05 DIAGNOSIS — I4891 Unspecified atrial fibrillation: Secondary | ICD-10-CM | POA: Diagnosis not present

## 2024-05-05 DIAGNOSIS — R0781 Pleurodynia: Secondary | ICD-10-CM | POA: Insufficient documentation

## 2024-05-05 MED ORDER — ACETAMINOPHEN ER 650 MG PO TBCR: 650.0000 mg | EXTENDED_RELEASE_TABLET | Freq: Three times a day (TID) | ORAL | 0 refills | Status: AC | PRN

## 2024-05-05 MED ORDER — LIDOCAINE 5 % EX PTCH: 1.0000 | MEDICATED_PATCH | CUTANEOUS | 0 refills | Status: AC
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
## 2024-07-20 ENCOUNTER — Ambulatory Visit: Admitting: Pulmonary Disease
